# Patient Record
Sex: Female | Born: 1935 | Race: White | Hispanic: No | Smoking: Never smoker
Health system: Southern US, Community
[De-identification: ages and names within clinical notes are randomized; demographics above are authoritative.]

## PROBLEM LIST (undated history)

## (undated) DIAGNOSIS — E785 Hyperlipidemia, unspecified: Secondary | ICD-10-CM

## (undated) DIAGNOSIS — I10 Essential (primary) hypertension: Secondary | ICD-10-CM

## (undated) DIAGNOSIS — G459 Transient cerebral ischemic attack, unspecified: Secondary | ICD-10-CM

## (undated) DIAGNOSIS — G43909 Migraine, unspecified, not intractable, without status migrainosus: Secondary | ICD-10-CM

## (undated) DIAGNOSIS — E039 Hypothyroidism, unspecified: Secondary | ICD-10-CM

## (undated) DIAGNOSIS — G43119 Migraine with aura, intractable, without status migrainosus: Principal | ICD-10-CM

## (undated) HISTORY — PX: ROTATOR CUFF REPAIR: SHX139

## (undated) HISTORY — DX: Hypothyroidism, unspecified: E03.9

## (undated) HISTORY — DX: Hyperlipidemia, unspecified: E78.5

## (undated) HISTORY — DX: Migraine with aura, intractable, without status migrainosus: G43.119

## (undated) HISTORY — DX: Essential (primary) hypertension: I10

## (undated) HISTORY — PX: ABDOMINAL HYSTERECTOMY: SHX81

## (undated) HISTORY — PX: APPENDECTOMY: SHX54

## (undated) HISTORY — PX: SHOULDER SURGERY: SHX246

---

## 2000-06-03 ENCOUNTER — Other Ambulatory Visit: Admission: RE | Admit: 2000-06-03 | Discharge: 2000-06-03 | Payer: Self-pay | Admitting: Family Medicine

## 2005-01-08 ENCOUNTER — Encounter: Admission: RE | Admit: 2005-01-08 | Discharge: 2005-01-08 | Payer: Self-pay | Admitting: Orthopedic Surgery

## 2005-02-27 ENCOUNTER — Encounter: Admission: RE | Admit: 2005-02-27 | Discharge: 2005-04-17 | Payer: Self-pay | Admitting: Orthopedic Surgery

## 2008-08-03 ENCOUNTER — Emergency Department (HOSPITAL_COMMUNITY): Admission: EM | Admit: 2008-08-03 | Discharge: 2008-08-04 | Payer: Self-pay | Admitting: Emergency Medicine

## 2008-08-13 ENCOUNTER — Ambulatory Visit (HOSPITAL_COMMUNITY): Admission: EM | Admit: 2008-08-13 | Discharge: 2008-08-13 | Payer: Self-pay | Admitting: Emergency Medicine

## 2008-10-16 ENCOUNTER — Encounter: Admission: RE | Admit: 2008-10-16 | Discharge: 2009-01-14 | Payer: Self-pay | Admitting: Orthopedic Surgery

## 2008-11-15 ENCOUNTER — Emergency Department (HOSPITAL_COMMUNITY): Admission: EM | Admit: 2008-11-15 | Discharge: 2008-11-15 | Payer: Self-pay | Admitting: Emergency Medicine

## 2009-05-30 ENCOUNTER — Ambulatory Visit: Payer: Self-pay | Admitting: Cardiology

## 2009-05-30 DIAGNOSIS — R0602 Shortness of breath: Secondary | ICD-10-CM

## 2009-05-30 DIAGNOSIS — I10 Essential (primary) hypertension: Secondary | ICD-10-CM

## 2009-06-06 ENCOUNTER — Telehealth (INDEPENDENT_AMBULATORY_CARE_PROVIDER_SITE_OTHER): Payer: Self-pay | Admitting: *Deleted

## 2009-06-07 ENCOUNTER — Encounter (HOSPITAL_COMMUNITY): Admission: RE | Admit: 2009-06-07 | Discharge: 2009-08-16 | Payer: Self-pay | Admitting: Cardiology

## 2009-06-07 ENCOUNTER — Ambulatory Visit: Payer: Self-pay | Admitting: Cardiology

## 2009-06-07 ENCOUNTER — Ambulatory Visit: Payer: Self-pay

## 2010-09-23 ENCOUNTER — Ambulatory Visit (HOSPITAL_COMMUNITY)
Admission: RE | Admit: 2010-09-23 | Discharge: 2010-09-23 | Disposition: A | Discharge status: Home / Self Care | Payer: MEDICARE | Source: Ambulatory Visit | Attending: Family Medicine | Admitting: Family Medicine

## 2010-09-23 DIAGNOSIS — M6281 Muscle weakness (generalized): Secondary | ICD-10-CM | POA: Insufficient documentation

## 2010-09-23 DIAGNOSIS — IMO0001 Reserved for inherently not codable concepts without codable children: Secondary | ICD-10-CM | POA: Insufficient documentation

## 2010-09-23 DIAGNOSIS — M545 Low back pain, unspecified: Secondary | ICD-10-CM | POA: Insufficient documentation

## 2010-09-23 DIAGNOSIS — M1990 Unspecified osteoarthritis, unspecified site: Secondary | ICD-10-CM | POA: Insufficient documentation

## 2010-09-23 DIAGNOSIS — R262 Difficulty in walking, not elsewhere classified: Secondary | ICD-10-CM | POA: Insufficient documentation

## 2010-11-28 LAB — URINALYSIS, ROUTINE W REFLEX MICROSCOPIC
Hgb urine dipstick: NEGATIVE
Ketones, ur: 15 mg/dL — AB
Nitrite: NEGATIVE
Protein, ur: NEGATIVE mg/dL
pH: 5.5 (ref 5.0–8.0)

## 2010-11-28 LAB — BASIC METABOLIC PANEL
CO2: 25 mEq/L (ref 19–32)
Chloride: 105 mEq/L (ref 96–112)
Creatinine, Ser: 0.94 mg/dL (ref 0.4–1.2)
GFR calc non Af Amer: 59 mL/min — ABNORMAL LOW (ref >60)

## 2010-11-28 LAB — DIFFERENTIAL
Lymphocytes Relative: 26 % (ref 12–46)
Monocytes Absolute: 0.8 10*3/uL (ref 0.1–1.0)
Monocytes Relative: 9 % (ref 3–12)
Neutro Abs: 5.5 10*3/uL (ref 1.7–7.7)

## 2010-11-28 LAB — CBC
Hemoglobin: 13.2 g/dL (ref 12.0–15.0)
MCHC: 33.6 g/dL (ref 30.0–36.0)
MCV: 82.4 fL (ref 78.0–100.0)
Platelets: 240 10*3/uL (ref 150–400)

## 2010-12-31 NOTE — Op Note (Signed)
Cindy Duran, Cindy Duran                  ACCOUNT NO.:  [id number]   MEDICAL RECORD NO.:  [id number]          PATIENT TYPE:  INP   LOCATION:  2550                         FACILITY:  [facility]   PHYSICIAN:  Vania Rea. Supple, M.D.  DATE OF BIRTH:  1936-07-16   DATE OF PROCEDURE:  08/13/2008  DATE OF DISCHARGE:  08/13/2008                               OPERATIVE REPORT   PREOPERATIVE DIAGNOSIS:  Displaced right two-part proximal humerus  fracture.   POSTOPERATIVE DIAGNOSIS:  Displaced right two-part proximal humerus  fracture.   PROCEDURE:  Open reduction and internal fixation of right two-part  proximal humerus fracture utilizing the hand innovations shoulder nail  plate.   SURGEON:  Vania Rea. Supple, MD   ASSISTANT:  Lucita Lora. Shuford, PA-C   ANESTHESIA:  General endotracheal as well as local.   ESTIMATED BLOOD LOSS:  200 mL.   DRAINS:  None.   HISTORY:  Ms. Printy is a 75 year old female who fell last week,  sustaining a displaced right two-part proximal humerus fracture.  She  presented to our office last week with persistent complaints of right  shoulder pain with radiographs showing persistent significant  displacement of the proximal humeral fracture.  Due to her ongoing pain,  functional limitations, and the degree of displacement, she is counseled  on treatment options as well as risks versus benefits thereof regarding  open reduction and internal fixation.  Possible complication with  infection, neurovascular injury, malunion, nonunion, loss of fixation,  anesthetic complication, and possible need for additional surgery are  reviewed.  She understands, accepts, and agrees with our planned  procedure.   PROCEDURE IN DETAIL:  After undergoing routine preop evaluation, the  patient received prophylactic antibiotics.  Brought to the operating  room, placed supine on the operating table and underwent smooth  induction of general endotracheal anesthesia.  Placed into a beach-chair  position and appropriately padded and protected.  Fluoroscopic images  were then brought in and confirmed that we had to properly visualize the  proximal humerus.  The right shoulder girdle region was then sterilely  prepped and draped in standard fashion.  The anterolateral deltoid  splitting incision was then made beginning across the acromion and  stenting laterally and distally for approximately 6 cm.  Skin flaps were  elevated and dissection was carried deeply and the deltoid was then  split along the line of its fibers between the anterior mid-third  junction of the deltoid.  Dissection carried into the  subacromial/subdeltoid bursa where multiple adhesions were divided.  The  fracture site was then readily identified and then large hemarthrosis  and hematoma were removed.  We then visualized the distal margin of the  fracture site and used a rongeur to create a trough for insertion of the  nail plate to the proper level.  We then used the standard [employer]  SNP/hand innovations nail plate introduced this into the humeral canal  and seated it to the appropriate depth.  Fluoroscopic images were then  used to confirm proper positioning of the implant and reduction was then  confirmed.  A  single central guide pin was placed into the humeral head  and proper position was then confirmed.  We then placed the peripheral  stabilizing pegs and then the central peg was placed also to appropriate  depth.  We then confirmed proper positioning of the pegs within the  humeral head with Fluoroscopic imaging.  We then used the outrigger  guide to place the 3 unicortical distal locking screws and these all  obtained good bony purchase.  Final fluoroscopic images showed good  positioning of the implant and good alignment of the fracture site.  Outrigger guide was then removed.  The wound was copiously irrigated.  Hemostasis was obtained.  It was closed in layers with a 0 Vicryl for  the deep fascia, 2-0  Vicryl for the subcu, and intracuticular 3-0  Monocryl for the skin followed by Steri-Strips.  A 0.5% Marcaine with  epinephrine instilled into the peri-incisional soft tissues.  Right arm  was placed in the sling.  The patient was then placed supine, awakened,  extubated, and taken to the recovery room in stable condition.      Vania Rea. Supple, M.D.  Electronically Signed     KMS/MEDQ  D:  08/13/2008  T:  08/14/2008  Job:  [id number]

## 2010-12-31 NOTE — Assessment & Plan Note (Signed)
[facility]                            CARDIOLOGY OFFICE NOTE   Cindy Duran, Cindy Duran                         MRN:          [id number]  DATE:05/30/2009                            DOB:          08-08-1936    PRIMARY CARE PHYSICIAN:  Ernestina Penna, MD   REASON FOR PRESENTATION:  Evaluate the patient with progressive dyspnea  and cardiovascular risk factors.   HISTORY OF PRESENT ILLNESS:  The patient is 75 years old.  She has had a  cardiac workup to include exercise treadmill test and an event monitor  in 2003.  She was not clear of the symptoms at that time.  She has never  been told that she had any cardiovascular disease.  However, she has had  progressive dyspnea.  She has been very limited because of back pain.  Over the past year, she has noted dyspnea with any, however minimal,  activity.  She says just walking across her house can get her dyspneic.  She does not describe PND.  She has chronically slept on 2 pillows  because it is more comfortable for her breathing.  She is not describing  any swelling.  She states her weights have been stable.  She does not  describe chest pressure, neck or arm discomfort.  She does not have any  palpitations, presyncope or syncope.  She is not sure whether this is  getting worse or not.  She has had no recent cardiovascular workup.   PAST MEDICAL HISTORY:  Hypertension x40 years, chronic back pain.   PAST SURGICAL HISTORY:  Shoulder surgery, hysterectomy, appendectomy.   ALLERGIES:  SULFA, PENICILLIN, DEMEROL, MORPHINE, IODINE, CRESTOR,  MOBIC, ZOCOR, LATEX.   MEDICATIONS:  1. Synthroid 175 mcg daily.  2. Vitamin D3.  3. Amlodipine 5 mg daily.  4. Diovan HCT 320/25 daily.   SOCIAL HISTORY:  The patient is retired.  She is a widow.  She has 3  children.  She never smoked cigarettes.  She does not drink alcohol.   FAMILY HISTORY:  Remarkable for her father having myocardial infarction  in his 65s.  She  had a brother who died suddenly at age 67 of a  myocardial infarction.  She had a sister died at age 109 of congestive  heart failure.  She has had siblings with later onset heart disease as  well.   REVIEW OF SYSTEMS:  As stated in the HPI and positive for mild  orthostatic symptoms, occasional headaches, mild difficulty swallowing,  joint pains.  Negative for all other systems.   PHYSICAL EXAMINATION:  GENERAL:  The patient is in no distress.  She is  pleasant.  VITAL SIGNS:  Blood pressure 130/70, heart rate 90 and regular, weight  215 pounds, body mass index 36.  HEENT:  Eyelids are unremarkable, pupils are equal, round and reactive  to light, fundi not visualized, oral mucosa unremarkable.  NECK:  No jugular venous distention at 45 degrees, carotid upstroke  brisk and symmetric, no bruits, no thyromegaly.  LYMPHATICS:  No cervical, axillary, or inguinal adenopathy.  LUNGS:  Clear to auscultation bilaterally.  BACK:  No costovertebral angle tenderness.  CHEST:  Unremarkable.  HEART:  PMI not displaced or sustained, S1 and S2 within normal limits,  no S3, no S4, no clicks, no rubs, no murmurs.  ABDOMEN:  Obese, positive bowel sounds.  Normal in frequency and pitch,  no bruits, no rebound, no guarding, no midline pulsatile mass, no  hepatomegaly, no splenomegaly.  SKIN:  No rashes, no nodules.  EXTREMITIES:  2+ pulses throughout, no edema, no cyanosis, no clubbing.  NEURO:  Oriented to person, place and time, cranial nerves II-XII are  grossly intact, motor grossly intact.   EKG; sinus rhythm, rate 98, rightward axis, intervals within normal  limits, no acute ST-T wave changes.   ASSESSMENT AND PLAN:  1. Dyspnea.  The patient's dyspnea is significant.  She does have      cardiovascular risk factors including a very strong family history.      This could be an anginal equivalent.  I would like to walk her on      the treadmill, but she could not do this.  Therefore, she needs  a      stress test with pharmacologic stress and perfusion imaging.  I      will arrange this.  I will also check a BNP level.  2. Obesity.  She understands the need to lose weight with diet and      exercise.  3. Hypertension.  Her blood pressure is controlled and she will      continue the meds as listed.  4. Peripheral vascular disease.  There is a screening study that was      done at the [facility].  It suggests reduced ABIs of 0.86 on      the left and 0.78 on the right.  However, her pulses are reasonable      and she is not describing claudication.  This can be managed with      risk reduction.  5. Hypothyroidism.  This is followed by Dr. Christell Constant.  The last TSH was      5.84 in June 2010.  6. Followup will be as needed based on the results or future symptoms.     Rollene Rotunda, MD, [facility]  Electronically Signed    JH/MedQ  DD: 05/30/2009  DT: 05/31/2009  Job #: [id number]   cc:   Ernestina Penna, M.D.

## 2011-05-23 LAB — DIFFERENTIAL
Basophils Absolute: 0 10*3/uL (ref 0.0–0.1)
Eosinophils Absolute: 0.3 10*3/uL (ref 0.0–0.7)
Lymphocytes Relative: 22 % (ref 12–46)
Lymphs Abs: 2.7 10*3/uL (ref 0.7–4.0)
Monocytes Absolute: 0.9 10*3/uL (ref 0.1–1.0)
Monocytes Relative: 7 % (ref 3–12)

## 2011-05-23 LAB — BASIC METABOLIC PANEL
Calcium: 9.1 mg/dL (ref 8.4–10.5)
Chloride: 104 mEq/L (ref 96–112)
Glucose, Bld: 103 mg/dL — ABNORMAL HIGH (ref 70–99)
Potassium: 4.1 mEq/L (ref 3.5–5.1)

## 2011-05-23 LAB — CBC: RBC: 4.18 MIL/uL (ref 3.87–5.11)

## 2011-05-23 LAB — PROTIME-INR: Prothrombin Time: 13.1 seconds (ref 11.6–15.2)

## 2012-11-19 ENCOUNTER — Other Ambulatory Visit: Payer: Self-pay | Admitting: Family Medicine

## 2012-11-22 NOTE — Telephone Encounter (Signed)
lasst seen 09/14/12, last filled 08/13

## 2012-11-25 ENCOUNTER — Other Ambulatory Visit: Payer: Self-pay | Admitting: *Deleted

## 2012-11-25 MED ORDER — LEVOTHYROXINE SODIUM 175 MCG PO TABS: 175.0000 ug | ORAL_TABLET | Freq: Every day | ORAL | Status: DC

## 2012-11-25 MED ORDER — DIAZEPAM 5 MG PO TABS: ORAL_TABLET | ORAL | Status: DC

## 2012-11-25 NOTE — Telephone Encounter (Signed)
LAST OFFICE VISIT 09/14/12. LET PT KNOW WHEN CALLED IN. [phone].

## 2012-11-29 ENCOUNTER — Other Ambulatory Visit: Payer: Self-pay | Admitting: *Deleted

## 2012-11-29 MED ORDER — PROMETHAZINE HCL 25 MG PO TABS: ORAL_TABLET | ORAL | Status: DC

## 2012-11-29 NOTE — Telephone Encounter (Signed)
LAST OV 1/14 

## 2012-12-02 ENCOUNTER — Telehealth: Payer: Self-pay

## 2012-12-02 MED ORDER — DIAZEPAM 5 MG PO TABS: ORAL_TABLET | ORAL | Status: DC

## 2012-12-02 MED ORDER — LEVOTHYROXINE SODIUM 175 MCG PO TABS: 175.0000 ug | ORAL_TABLET | Freq: Every day | ORAL | Status: DC

## 2012-12-02 NOTE — Telephone Encounter (Signed)
Patient last seen 09/14/12   Thyroid labs 9/13  Call in rx and have nurse call patient to inform

## 2012-12-10 ENCOUNTER — Other Ambulatory Visit: Payer: Self-pay | Admitting: Family Medicine

## 2012-12-13 NOTE — Telephone Encounter (Signed)
DIAZEPAM STILL NEEDS TO BE CALLED IN.

## 2012-12-13 NOTE — Telephone Encounter (Signed)
LAST SEEN 09/14/12. LAST REFILL 12/12

## 2012-12-13 NOTE — Telephone Encounter (Signed)
Phoned into [facility] and left authorization on their voicemail.

## 2013-01-17 ENCOUNTER — Ambulatory Visit: Payer: Self-pay | Admitting: Family Medicine

## 2013-04-14 ENCOUNTER — Emergency Department (HOSPITAL_COMMUNITY): Payer: Medicare Other

## 2013-04-14 ENCOUNTER — Observation Stay (HOSPITAL_COMMUNITY)
Admission: EM | Admit: 2013-04-14 | Discharge: 2013-04-16 | Disposition: A | Discharge status: Home / Self Care | Payer: Medicare Other | Attending: Internal Medicine | Admitting: Internal Medicine

## 2013-04-14 ENCOUNTER — Encounter (HOSPITAL_COMMUNITY): Payer: Self-pay | Admitting: Nurse Practitioner

## 2013-04-14 DIAGNOSIS — Z66 Do not resuscitate: Secondary | ICD-10-CM | POA: Insufficient documentation

## 2013-04-14 DIAGNOSIS — E079 Disorder of thyroid, unspecified: Secondary | ICD-10-CM | POA: Insufficient documentation

## 2013-04-14 DIAGNOSIS — R471 Dysarthria and anarthria: Secondary | ICD-10-CM | POA: Insufficient documentation

## 2013-04-14 DIAGNOSIS — G43909 Migraine, unspecified, not intractable, without status migrainosus: Secondary | ICD-10-CM | POA: Insufficient documentation

## 2013-04-14 DIAGNOSIS — R0602 Shortness of breath: Secondary | ICD-10-CM

## 2013-04-14 DIAGNOSIS — E039 Hypothyroidism, unspecified: Secondary | ICD-10-CM | POA: Diagnosis not present

## 2013-04-14 DIAGNOSIS — I10 Essential (primary) hypertension: Secondary | ICD-10-CM | POA: Diagnosis not present

## 2013-04-14 DIAGNOSIS — E785 Hyperlipidemia, unspecified: Secondary | ICD-10-CM | POA: Diagnosis not present

## 2013-04-14 DIAGNOSIS — G459 Transient cerebral ischemic attack, unspecified: Principal | ICD-10-CM | POA: Insufficient documentation

## 2013-04-14 DIAGNOSIS — R209 Unspecified disturbances of skin sensation: Secondary | ICD-10-CM | POA: Insufficient documentation

## 2013-04-14 DIAGNOSIS — R29810 Facial weakness: Secondary | ICD-10-CM | POA: Insufficient documentation

## 2013-04-14 HISTORY — DX: Migraine, unspecified, not intractable, without status migrainosus: G43.909

## 2013-04-14 LAB — BASIC METABOLIC PANEL
BUN: 14 mg/dL (ref 6–23)
Creatinine, Ser: 0.84 mg/dL (ref 0.50–1.10)
GFR calc Af Amer: 77 mL/min — ABNORMAL LOW (ref >90)
GFR calc non Af Amer: 67 mL/min — ABNORMAL LOW (ref >90)

## 2013-04-14 LAB — POCT I-STAT, CHEM 8
Chloride: 107 mEq/L (ref 96–112)
HCT: 41 % (ref 36.0–46.0)
Potassium: 4.2 mEq/L (ref 3.5–5.1)
Sodium: 141 mEq/L (ref 135–145)

## 2013-04-14 LAB — CBC WITH DIFFERENTIAL/PLATELET
Basophils Relative: 0 % (ref 0–1)
Eosinophils Absolute: 0.3 10*3/uL (ref 0.0–0.7)
HCT: 40 % (ref 36.0–46.0)
Hemoglobin: 13.7 g/dL (ref 12.0–15.0)
MCH: 29.5 pg (ref 26.0–34.0)
MCHC: 34.3 g/dL (ref 30.0–36.0)
Monocytes Absolute: 0.8 10*3/uL (ref 0.1–1.0)
Monocytes Relative: 9 % (ref 3–12)
Neutrophils Relative %: 57 % (ref 43–77)
RDW: 14 % (ref 11.5–15.5)

## 2013-04-14 MED ORDER — DIPHENHYDRAMINE HCL 50 MG/ML IJ SOLN
25.0000 mg | Freq: Once | INTRAMUSCULAR | Status: AC
  Administered 2013-04-14: 25 mg via INTRAVENOUS
  Filled 2013-04-14: qty 1

## 2013-04-14 MED ORDER — ASPIRIN EC 81 MG PO TBEC
81.0000 mg | DELAYED_RELEASE_TABLET | Freq: Every day | ORAL | Status: DC
  Administered 2013-04-14 – 2013-04-15 (×2): 81 mg via ORAL
  Filled 2013-04-14 (×3): qty 1

## 2013-04-14 MED ORDER — DIAZEPAM 5 MG PO TABS
5.0000 mg | ORAL_TABLET | Freq: Once | ORAL | Status: AC
  Administered 2013-04-14: 5 mg via ORAL
  Filled 2013-04-14: qty 1

## 2013-04-14 MED ORDER — METOCLOPRAMIDE HCL 5 MG/ML IJ SOLN
10.0000 mg | Freq: Once | INTRAMUSCULAR | Status: AC
  Administered 2013-04-14: 10 mg via INTRAVENOUS
  Filled 2013-04-14: qty 2

## 2013-04-14 NOTE — ED Notes (Addendum)
Pt reports today around 1730 she felt tears pouring from her R eye and 'it felt droopy" and she was unable to pick up objects with L hand. At the time she called daughter and was "unable to get her words out." symptoms are resolving. A&Ox4 now, speech is clear, grips are =

## 2013-04-14 NOTE — ED Provider Notes (Signed)
CSN: [id number]     Arrival date & time 04/14/13  4098 History   First MD Initiated Contact with Patient 04/14/13 1951     Chief Complaint  Patient presents with  . Stroke Symptoms   (Consider location/radiation/quality/duration/timing/severity/associated sxs/prior Treatment) The history is provided by the patient.  Cindy Duran is a 77 y.o. female history of hypertension, migraine presenting left hand numbness and right facial droop and slurred speech. And numbness and weakness started about 5:30 PM today. Subsequently she noticed that she was drooling on the right side and had slurred speech for about 10 minutes. Symptoms resolved on arrival. Also has intermittent headaches that is getting worse but she said that similar to her previous migraines. She said headache is not very bad currently and it is not sudden onset or worse headache of her life.   Past Medical History  Diagnosis Date  . Hypertension   . Migraine   . Thyroid disease    Past Surgical History  Procedure Laterality Date  . Abdominal hysterectomy     History reviewed. No pertinent family history. History  Substance Use Topics  . Smoking status: Never Smoker   . Smokeless tobacco: Not on file  . Alcohol Use: No   OB History   Grav Para Term Preterm Abortions TAB SAB Ect Mult Living                 Review of Systems  Neurological: Positive for speech difficulty, weakness and headaches.  All other systems reviewed and are negative.    Allergies  Iodine; Latex; Meloxicam; Meperidine hcl; Morphine; Penicillins; Rosuvastatin; Simvastatin; and Sulfonamide derivatives  Home Medications   Current Outpatient Rx  Name  Route  Sig  Dispense  Refill  . diazepam (VALIUM) 5 MG tablet   Oral   Take 5 mg by mouth daily as needed for anxiety.         Marland Kitchen levothyroxine (SYNTHROID) 175 MCG tablet   Oral   Take 1 tablet (175 mcg total) by mouth daily before breakfast.   30 tablet   4   . traMADol (ULTRAM) 50 MG  tablet   Oral   Take 50 mg by mouth daily as needed for pain.          BP 153/62  Pulse 86  Temp(Src) 97.8 F (36.6 C) (Oral)  Resp 21  Ht 5\' 6"  (1.676 m)  Wt 195 lb (88.451 kg)  BMI 31.49 kg/m2  SpO2 96% Physical Exam  Nursing note and vitals reviewed. Constitutional: She is oriented to person, place, and time. She appears well-developed and well-nourished.  Comfortable, NAD   HENT:  Head: Normocephalic.  Mouth/Throat: Oropharynx is clear and moist.  Eyes: Conjunctivae are normal. Pupils are equal, round, and reactive to light.  Neck: Normal range of motion. Neck supple.  Cardiovascular: Normal rate, regular rhythm and normal heart sounds.   Pulmonary/Chest: Effort normal and breath sounds normal. No respiratory distress. She has no wheezes. She has no rales.  Abdominal: Soft. Bowel sounds are normal. She exhibits no distension. There is no tenderness. There is no rebound and no guarding.  Musculoskeletal: Normal range of motion. She exhibits no edema and no tenderness.  Neurological: She is alert and oriented to person, place, and time.  Face symmetric, no droop. NL strength and sensation throughout.   Skin: Skin is warm and dry.  Psychiatric: She has a normal mood and affect. Her behavior is normal. Judgment and thought content normal.  ED Course  Procedures (including critical care time) Labs Review Labs Reviewed  BASIC METABOLIC PANEL - Abnormal; Notable for the following:    GFR calc non Af Amer 67 (*)    GFR calc Af Amer 77 (*)    All other components within normal limits  POCT I-STAT, CHEM 8 - Abnormal; Notable for the following:    Calcium, Ion 1.12 (*)    All other components within normal limits  GLUCOSE, CAPILLARY  CBC WITH DIFFERENTIAL  LIPID PANEL  HEMOGLOBIN A1C   Imaging Review Ct Head (brain) Wo Contrast  04/14/2013   *RADIOLOGY REPORT*  Clinical Data: Blurred speech, numbness left hand, right side headache, mild chronic right  CT HEAD WITHOUT  CONTRAST  Technique:  Contiguous axial images were obtained from the base of the skull through the vertex without contrast.  Comparison: 11/15/2000  Findings: Generalized atrophy. Normal ventricular morphology. No midline shift or mass effect. Small vessel chronic ischemic changes of deep cerebral white matter. 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 No intracranial hemorrhage, mass lesion or evidence of acute infarction. No extra-axial fluid collection. Small mucosal retention cyst right maxillary sinus. Osseous structures unremarkable.  IMPRESSION: Mild small vessel chronic ischemic changes of deep cerebral white matter. 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 No acute intracranial abnormalities.   Original Report Authenticated By: Ulyses Southward, M.D.      MDM  No diagnosis found. Cindy Duran is a 77 y.o. female here with symptoms of TIA. No residual neuro deficit so code stroke not called (even though she is in the window). Will do CT head and call neuro. Will likely need inpatient stroke workup.   11:17 PM Neuro saw patient. Recommend admission for TIA workup. Will admit under Dr. Cena Benton.    Richardean Canal, MD 04/14/13 [phone]

## 2013-04-14 NOTE — H&P (Signed)
[facility] History and Physical  Cindy Duran ZOX:[id number] DOB: 1936/02/15 DOA: 04/14/2013  Referring physician: Dr. Silverio Lay PCP: Rudi Heap, MD  Specialists: Neurologist: Dr. Wyatt Portela  Chief Complaint: Numbness at left hand and altered speech  HPI: Cindy Duran is a 77 y.o. female  Who presented with the above complaints starting since 5:30 pm.  Reportedly the symptoms lasted several hours but have resolved since presentation to the ED.  Nothing the patient is aware of made her symptoms better or worse and they just seemed to have resolved without any intervention.  Neurologist was consulted and after evaluation recommended further work up.  The patient denies any headaches prior to event, new fevers, chills, and or photophobia.   We were consulted for admission and evaluation given concerns for TIA.   Review of Systems: 10 point review of systems negative unless otherwise mentioned above  Past Medical History  Diagnosis Date  . Hypertension   . Migraine   . Thyroid disease    Past Surgical History  Procedure Laterality Date  . Abdominal hysterectomy     Social History:  reports that she has never smoked. She does not have any smokeless tobacco history on file. She reports that she does not drink alcohol or use illicit drugs.   Can patient participate in ADLs? yes  Allergies  Allergen Reactions  . Iodine Other (See Comments)    "skin comes off"  . Latex Other (See Comments)    Skin peeling off  . Meloxicam Other (See Comments)    Pt can't remember rxn  . Meperidine Hcl Nausea And Vomiting  . Morphine Nausea And Vomiting  . Other     Clorox Bleach  . Penicillins Hives  . Rosuvastatin Other (See Comments)    Pt can't remember rxn  . Simvastatin Other (See Comments)    Pt can't remember rxn  . Sulfonamide Derivatives Swelling    History reviewed. No pertinent family history. None other reported  Prior to Admission medications   Medication Sig Start  Date End Date Taking? Authorizing Provider  diazepam (VALIUM) 5 MG tablet Take 5 mg by mouth daily as needed for anxiety.   Yes Historical Provider, MD  levothyroxine (SYNTHROID) 175 MCG tablet Take 1 tablet (175 mcg total) by mouth daily before breakfast. 12/02/12  Yes Ernestina Penna, MD  traMADol (ULTRAM) 50 MG tablet Take 50 mg by mouth daily as needed for pain.   Yes Historical Provider, MD   Physical Exam: Filed Vitals:   04/14/13 2300  BP: 153/62  Pulse: 86  Temp:   Resp: 21     General:  Pt in NAD, alert and awake  Eyes: EOMI, non icteric  ENT: normal exterior appearance, MMM  Neck: supple, no goiter  Cardiovascular: RRR, no MRG  Respiratory: CTA BL, no wheezes  Abdomen: soft, NT, ND  Skin: warm and dry  Musculoskeletal: no cyanosis or clubbing  Psychiatric: mood and affect appropriate  Neurologic: answers questions appropriately, no pronator drift, no facial asymmetry. Moves all extremities equally.  Labs on Admission:  Basic Metabolic Panel:  Recent Labs Lab 04/14/13 2034 04/14/13 2246  NA 140 141  K 4.2 4.2  CL 106 107  CO2 25  --   GLUCOSE 92 92  BUN 14 14  CREATININE 0.84 0.90  CALCIUM 9.4  --    Liver Function Tests: No results found for this basename: AST, ALT, ALKPHOS, BILITOT, PROT, ALBUMIN,  in the last 168 hours No results found  for this basename: LIPASE, AMYLASE,  in the last 168 hours No results found for this basename: AMMONIA,  in the last 168 hours CBC:  Recent Labs Lab 04/14/13 2034 04/14/13 2246  WBC 8.4  --   NEUTROABS 4.8  --   HGB 13.7 13.9  HCT 40.0 41.0  MCV 86.2  --   PLT 222  --    Cardiac Enzymes: No results found for this basename: CKTOTAL, CKMB, CKMBINDEX, TROPONINI,  in the last 168 hours  BNP (last 3 results) No results found for this basename: PROBNP,  in the last 8760 hours CBG:  Recent Labs Lab 04/14/13 1909  GLUCAP 93    Radiological Exams on Admission: Ct Head (brain) Wo Contrast  04/14/2013    *RADIOLOGY REPORT*  Clinical Data: Blurred speech, numbness left hand, right side headache, mild chronic right  CT HEAD WITHOUT CONTRAST  Technique:  Contiguous axial images were obtained from the base of the skull through the vertex without contrast.  Comparison: 11/15/2000  Findings: Generalized atrophy. Normal ventricular morphology. No midline shift or mass effect. Small vessel chronic ischemic changes of deep cerebral white matter. 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 No intracranial hemorrhage, mass lesion or evidence of acute infarction. No extra-axial fluid collection. Small mucosal retention cyst right maxillary sinus. Osseous structures unremarkable.  IMPRESSION: Mild small vessel chronic ischemic changes of deep cerebral white matter. 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 No acute intracranial abnormalities.   Original Report Authenticated By: Ulyses Southward, M.D.    EKG: Independently reviewed. Normal sinus rhythm with no ST elevations or depression. Non specific st wave changes  Assessment/Plan Active Problems:  1. TIA - Neurology on board - Routine TIA order set placed - Further imaging studies ordered please refer to neurologist note for details - asa 81 mg recommended by neurologist. - Allow permissive htn at this juncture.  2. Hypothyroidism - stable, continue home regimen.  3. DVT prophylaxis - Heparin   Code Status: DNR Family Communication: discussed with patient and family  Disposition Plan: Pending further evaluation and recommendations from neurologist.   Time spent: > 50 minutes  Penny Pia [facility] Pager [phone]  If 7PM-7AM, please contact night-coverage [email or web address] Password [facility] 04/14/2013, 11:31 PM

## 2013-04-14 NOTE — Consult Note (Signed)
Referring Physician: Dr. Bernette Mayers    Chief Complaint: transient dysarthria, left hand-face numbness, left face droop.  HPI:                                                                                                                                         Cindy Duran is an 77 y.o. female with a past medical history significant for HTN, hyperlipidemia, episodic migraine without aura, thyroid disease, who was in her usual state of health until 5:30 pm today when she developed abrupt onset of left hand and face numbness, left face droopiness,  and slurred speech. Never had similar symptoms before. She recalls picking up her grandson, sitting in a chair, and then experiencing numbness of the left hand and face. She said that she couldn't speak properly and when she talked to her daughter on the phone " she sound it like a drunk person". Her son said that when he arrived home he notice that " her smile was not symmetric". The episode lasted for about 15 or 20 minutes and resolved. Cindy Duran said that she has been having a headache today but never had had a migraine with the above mentioned symptoms.Denies vertigo, double vision, difficulty swallowing, focal weakness, confusion, or visual disturbances. CT brain revealed no acute abnormality.  Date last known well: 04/14/13  Time last known well: 5:30 pm tPA Given: no, symptoms resolved NIHSS: 0 MRS: 0  Past Medical History  Diagnosis Date  . Hypertension   . Migraine   . Thyroid disease     Past Surgical History  Procedure Laterality Date  . Abdominal hysterectomy      History reviewed. No pertinent family history. Social History:  reports that she has never smoked. She does not have any smokeless tobacco history on file. She reports that she does not drink alcohol or use illicit drugs.  Allergies:  Allergies  Allergen Reactions  . Iodine   . Latex   . Meloxicam   . Meperidine Hcl   . Morphine   . Penicillins   . Rosuvastatin   .  Simvastatin   . Sulfonamide Derivatives     Medications:                                                                                                                           I have reviewed the patient's  current medications.  ROS:                                                                                                                                       History obtained from the patient, family, chart review.  General ROS: negative for - chills, fatigue, fever, night sweats, weight gain or weight loss Psychological ROS: negative for - behavioral disorder, hallucinations, memory difficulties, mood swings or suicidal ideation Ophthalmic ROS: negative for - blurry vision, double vision, eye pain or loss of vision ENT ROS: negative for - epistaxis, nasal discharge, oral lesions, sore throat, tinnitus or vertigo Allergy and Immunology ROS: negative for - hives or itchy/watery eyes Hematological and Lymphatic ROS: negative for - bleeding problems, bruising or swollen lymph nodes Endocrine ROS: negative for - galactorrhea, hair pattern changes, polydipsia/polyuria or temperature intolerance Respiratory ROS: negative for - cough, hemoptysis, shortness of breath or wheezing Cardiovascular ROS: negative for - chest pain, dyspnea on exertion, edema or irregular heartbeat Gastrointestinal ROS: negative for - abdominal pain, diarrhea, hematemesis, nausea/vomiting or stool incontinence Genito-Urinary ROS: negative for - dysuria, hematuria, incontinence or urinary frequency/urgency Musculoskeletal ROS: negative for - joint swelling or muscular weakness Neurological ROS: as noted in HPI Dermatological ROS: negative for rash and skin lesion changes    Physical exam: pleasant female in no apparent distress. Blood pressure 160/92, pulse 71, temperature 97.8 F (36.6 C), temperature source Oral, resp. rate 16, height 5\' 6"  (1.676 m), weight 88.451 kg (195 lb), SpO2 97.00%. Head:  normocephalic. Neck: supple, no bruits, no JVD. Cardiac: no murmurs. Lungs: clear. Abdomen: soft, no tender, no mass. Extremities: no edema.    Neurologic Examination:                                                                                                      Mental Status: Alert, oriented, thought content appropriate.  Speech fluent without evidence of aphasia.  Able to follow 3 step commands without difficulty. Cranial Nerves: II: Discs flat bilaterally; Visual fields grossly normal, pupils equal, round, reactive to light and accommodation III,IV, VI: ptosis not present, extra-ocular motions intact bilaterally V,VII: smile symmetric, facial light touch sensation normal bilaterally VIII: hearing normal bilaterally IX,X: gag reflex present XI: bilateral shoulder shrug XII: midline tongue extension Motor: Right : Upper extremity   5/5    Left:     Upper extremity   5/5  Lower extremity   5/5     Lower extremity   5/5 Tone  and bulk:normal tone throughout; no atrophy noted Sensory: Pinprick and light touch intact throughout, bilaterally Deep Tendon Reflexes:  Right: Upper Extremity   Left: Upper extremity   biceps (C-5 to C-6) 2/4   biceps (C-5 to C-6) 2/4 tricep (C7) 2/4    triceps (C7) 2/4 Brachioradialis (C6) 2/4  Brachioradialis (C6) 2/4  Lower Extremity Lower Extremity  quadriceps (L-2 to L-4) 2/4   quadriceps (L-2 to L-4) 2/4 Achilles (S1) 2/4   Achilles (S1) 2/4  Plantars: Right: downgoing   Left: downgoing Cerebellar: normal finger-to-nose,  normal heel-to-shin test Gait:  No ataxia. CV: pulses palpable throughout    Results for orders placed during the hospital encounter of 04/14/13 (from the past 48 hour(s))  GLUCOSE, CAPILLARY     Status: None   Collection Time    04/14/13  7:09 PM      Result Value Range   Glucose-Capillary 93  70 - 99 mg/dL   No results found.   Assessment: 77 y.o. female with possible right brain TIA. She has a history of  episodic migraine and can not entirely exclude the possibility of complicated migraine. Recommend admission to medicine and completion of TIA work up. Aspirin 81 mg daily.    Stroke Risk Factors - age, HTN, hyperlipidemia.  Plan: 1. HgbA1c, fasting lipid panel 2. MRI, MRA  of the brain without contrast 3. Echocardiogram 4. Carotid dopplers 5. Prophylactic therapy-aspirin 81 mg daily 6. Risk factor modification 7. Telemetry monitoring 8. Frequent neuro checks 9. PT/OT SLP (no need at this time)   Wyatt Portela, MD [facility] [phone]  04/14/2013, 9:03 PM

## 2013-04-15 ENCOUNTER — Encounter (HOSPITAL_COMMUNITY): Payer: Self-pay | Admitting: *Deleted

## 2013-04-15 ENCOUNTER — Observation Stay (HOSPITAL_COMMUNITY): Payer: Medicare Other

## 2013-04-15 DIAGNOSIS — I517 Cardiomegaly: Secondary | ICD-10-CM

## 2013-04-15 LAB — TSH: TSH: 5.337 u[IU]/mL — ABNORMAL HIGH (ref 0.350–4.500)

## 2013-04-15 LAB — RAPID URINE DRUG SCREEN, HOSP PERFORMED
Barbiturates: NOT DETECTED
Cocaine: NOT DETECTED
Tetrahydrocannabinol: NOT DETECTED

## 2013-04-15 LAB — HEMOGLOBIN A1C
Hgb A1c MFr Bld: 5.5 % (ref <5.7)
Mean Plasma Glucose: 111 mg/dL (ref <117)

## 2013-04-15 MED ORDER — HEPARIN SODIUM (PORCINE) 5000 UNIT/ML IJ SOLN
5000.0000 [IU] | Freq: Three times a day (TID) | INTRAMUSCULAR | Status: DC
  Administered 2013-04-15 – 2013-04-16 (×4): 5000 [IU] via SUBCUTANEOUS
  Filled 2013-04-15 (×7): qty 1

## 2013-04-15 MED ORDER — ATORVASTATIN CALCIUM 20 MG PO TABS
20.0000 mg | ORAL_TABLET | Freq: Every day | ORAL | Status: DC
  Administered 2013-04-15: 20 mg via ORAL
  Filled 2013-04-15 (×2): qty 1

## 2013-04-15 MED ORDER — TRAMADOL HCL 50 MG PO TABS: 50.0000 mg | ORAL_TABLET | Freq: Every day | ORAL | Status: DC | PRN

## 2013-04-15 MED ORDER — LEVOTHYROXINE SODIUM 175 MCG PO TABS
175.0000 ug | ORAL_TABLET | Freq: Every day | ORAL | Status: DC
  Administered 2013-04-15: 175 ug via ORAL
  Filled 2013-04-15 (×3): qty 1

## 2013-04-15 MED ORDER — DIAZEPAM 5 MG PO TABS: 5.0000 mg | ORAL_TABLET | Freq: Every day | ORAL | Status: DC | PRN

## 2013-04-15 NOTE — Progress Notes (Deleted)
Echo Lab  2D Echocardiogram completed.  Laprecious Austill L Aldrin Engelhard, RDCS 04/15/2013 9:46 AM

## 2013-04-15 NOTE — Progress Notes (Signed)
Echocardiogram 2D Echocardiogram has been performed.  Cindy Duran 04/15/2013, 2:37 PM

## 2013-04-15 NOTE — Progress Notes (Signed)
[facility] PROGRESS NOTE  Cindy Duran VHQ:[id number] DOB: 1935/10/04 DOA: 04/14/2013 PCP: Rudi Heap, MD  Assessment/Plan: TIA - head CT negative. MRI brain, 2D echo and carotid doppler pending - asa 81 mg daily. Appreciate neuro recs. - Allow permissive HTN - LDL of 102, continue lipitor   Hypothyroidism  - check TSH   HTN  allow permissive BP  Hx of migraine  on equate at home prn symptoms   DVT prophylaxis  SQ  Heparin      Code Status: DNR Family Communication: family at bedside Disposition Plan:home once w/up completed   Consultants:  neurology  Procedures:  none  Antibiotics:  none  HPI/Subjective: Patient denies any weakness or symptoms at this time  Objective: Filed Vitals:   04/15/13 1042  BP: 133/72  Pulse: 45  Temp: 98.1 F (36.7 C)  Resp: 20   No intake or output data in the 24 hours ending 04/15/13 1301 Filed Weights   04/14/13 1858  Weight: 88.451 kg (195 lb)    Exam:   General: elderly female in AND   HEENT: no pallor, moist oral mucosa  Chest: clear b/l, no added sounds  CVS: NS1&S2, no murmurs  Abd: soft, NT, ND, BS+  Ext: warm, no edema  CNS: AAOX3, non focal    Data Reviewed: Basic Metabolic Panel:  Recent Labs Lab 04/14/13 2034 04/14/13 2246  NA 140 141  K 4.2 4.2  CL 106 107  CO2 25  --   GLUCOSE 92 92  BUN 14 14  CREATININE 0.84 0.90  CALCIUM 9.4  --    Liver Function Tests: No results found for this basename: AST, ALT, ALKPHOS, BILITOT, PROT, ALBUMIN,  in the last 168 hours No results found for this basename: LIPASE, AMYLASE,  in the last 168 hours No results found for this basename: AMMONIA,  in the last 168 hours CBC:  Recent Labs Lab 04/14/13 2034 04/14/13 2246  WBC 8.4  --   NEUTROABS 4.8  --   HGB 13.7 13.9  HCT 40.0 41.0  MCV 86.2  --   PLT 222  --    Cardiac Enzymes: No results found for this basename: CKTOTAL, CKMB, CKMBINDEX, TROPONINI,  in the last 168  hours BNP (last 3 results) No results found for this basename: PROBNP,  in the last 8760 hours CBG:  Recent Labs Lab 04/14/13 1909  GLUCAP 93    No results found for this or any previous visit (from the past 240 hour(s)).   Studies: Ct Head (brain) Wo Contrast  04/14/2013   *RADIOLOGY REPORT*  Clinical Data: Blurred speech, numbness left hand, right side headache, mild chronic right  CT HEAD WITHOUT CONTRAST  Technique:  Contiguous axial images were obtained from the base of the skull through the vertex without contrast.  Comparison: 11/15/2000  Findings: Generalized atrophy. Normal ventricular morphology. No midline shift or mass effect. Small vessel chronic ischemic changes of deep cerebral white matter. 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 No intracranial hemorrhage, mass lesion or evidence of acute infarction. No extra-axial fluid collection. Small mucosal retention cyst right maxillary sinus. Osseous structures unremarkable.  IMPRESSION: Mild small vessel chronic ischemic changes of deep cerebral white matter. 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 No acute intracranial abnormalities.   Original Report Authenticated By: Ulyses Southward, M.D.    Scheduled Meds: . aspirin EC  81 mg Oral Daily  . atorvastatin  20 mg Oral QHS  . heparin  5,000 Units Subcutaneous Q8H  .  levothyroxine  175 mcg Oral QAC breakfast   Continuous Infusions:      Time spent: 25 minutes    Jadwiga Faidley  [facility] Pager [phone]. If 7PM-7AM, please contact night-coverage at [email or web address], password [facility] 04/15/2013, 1:01 PM  LOS: 1 day

## 2013-04-15 NOTE — Progress Notes (Signed)
Stroke Team Progress Note  HISTORY Cindy Duran is an 77 y.o. female with a past medical history significant for HTN, hyperlipidemia, episodic migraine without aura, thyroid disease, who was in her usual state of health until 5:30 pm today 04/14/2013 when she developed abrupt onset of left hand and face numbness, left face droopiness, and slurred speech. Never had similar symptoms before. She recalls picking up her grandson, sitting in a chair, and then experiencing numbness of the left hand and face. She said that she couldn't speak properly and when she talked to her daughter on the phone " she sound it like a drunk person". Her son said that when he arrived home he notice that " her smile was not symmetric". The episode lasted for about 15 or 20 minutes and resolved. She presented to the ED 04/14/2013 1852.   Mrs. Deutschman said that she has been having a headache but never had had a migraine with the above mentioned symptoms. Denies vertigo, double vision, difficulty swallowing, focal weakness, confusion, or visual disturbances. CT brain revealed no acute abnormality.   Patient was not a TPA candidate secondary to symptoms resolved, NIHSS 0. She was admitted for further evaluation and treatment.  SUBJECTIVE Her daughter and granddaughter are at the bedside.  Overall she feels her condition is gradually improving.   OBJECTIVE Most recent Vital Signs: Filed Vitals:   04/14/13 2300 04/15/13 0051 04/15/13 0132 04/15/13 0539  BP: 153/62 179/87 155/81 159/89  Pulse: 86 83 75 75  Temp:  97.6 F (36.4 C) 97.5 F (36.4 C) 97.7 F (36.5 C)  TempSrc:  Oral Oral Oral  Resp: 21 20 20 20   Height:      Weight:      SpO2: 96% 99% 98% 96%   CBG (last 3)   Recent Labs  04/14/13 1909  GLUCAP 93    IV Fluid Intake:     MEDICATIONS  . aspirin EC  81 mg Oral Daily  . heparin  5,000 Units Subcutaneous Q8H  . levothyroxine  175 mcg Oral QAC breakfast   PRN:  diazepam, traMADol  Diet:  Cardiac thin  liquids Activity:   Bathroom privileges with assistance DVT Prophylaxis:  Heparin 5000 units sq tid   CLINICALLY SIGNIFICANT STUDIES Basic Metabolic Panel:  Recent Labs Lab 04/14/13 2034 04/14/13 2246  NA 140 141  K 4.2 4.2  CL 106 107  CO2 25  --   GLUCOSE 92 92  BUN 14 14  CREATININE 0.84 0.90  CALCIUM 9.4  --    Liver Function Tests: No results found for this basename: AST, ALT, ALKPHOS, BILITOT, PROT, ALBUMIN,  in the last 168 hours CBC:  Recent Labs Lab 04/14/13 2034 04/14/13 2246  WBC 8.4  --   NEUTROABS 4.8  --   HGB 13.7 13.9  HCT 40.0 41.0  MCV 86.2  --   PLT 222  --    Coagulation: No results found for this basename: LABPROT, INR,  in the last 168 hours Cardiac Enzymes: No results found for this basename: CKTOTAL, CKMB, CKMBINDEX, TROPONINI,  in the last 168 hours Urinalysis: No results found for this basename: COLORURINE, APPERANCEUR, LABSPEC, PHURINE, GLUCOSEU, HGBUR, BILIRUBINUR, KETONESUR, PROTEINUR, UROBILINOGEN, NITRITE, LEUKOCYTESUR,  in the last 168 hours Lipid Panel    Component Value Date/Time   CHOL 162 04/15/2013 0650   TRIG 90 04/15/2013 0650   HDL 42 04/15/2013 0650   CHOLHDL 3.9 04/15/2013 0650   VLDL 18 04/15/2013 0650   LDLCALC 102*  04/15/2013 0650   HgbA1C  No results found for this basename: HGBA1C    Urine Drug Screen:   No results found for this basename: labopia, cocainscrnur, labbenz, amphetmu, thcu, labbarb    Alcohol Level: No results found for this basename: ETH,  in the last 168 hours  CT of the brain  04/14/2013    Mild small vessel chronic ischemic changes of deep cerebral white matter. 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 No acute intracranial abnormalities.  MRI of the brain    MRA of the brain    2D Echocardiogram    Carotid Doppler    CXR    EKG  normal sinus rhythm.   Therapy Recommendations   Physical Exam   Pleasant elderly Caucasian lady not in distress.Awake alert. Afebrile. Head is  nontraumatic. Neck is supple without bruit. Hearing is normal. Cardiac exam no murmur or gallop. Lungs are clear to auscultation. Distal pulses are well felt. Neurological Exam :   Awake  Alert oriented x 3. Normal speech and language.eye movements full without nystagmus.fundi were not visualized. Vision acuity and fields appear normal. Hearing is normal. Palatal movements are normal. Face symmetric. Tongue midline. Normal strength, tone, reflexes and coordination. Normal sensation. Gait deferred. ASSESSMENT Cindy Duran is a 77 y.o. female presenting with transient dysarthria, left hand-face numbness, left face droop. CT shows old left external capsule lacunar infarct, MRI imaging pending. Suspect a right brain stroke, right brain TIA or complicated migraine.   On aspirin 81 mg orally every day prior to admission. Now on aspirin 81 mg orally every day for secondary stroke prevention. Patient with no resultant neuro deficits. Work up underway.  Hypertension Hyperlipidemia, LDL 102, on lipitor 20 PTA, now on no statin, goal LDL < 100 Hx Episodic migraine without aura - has had extensive testing in the past. Uses generic Equate Headache Relief, up to 4 tablets a day, each individual table contains 250 mg of aspirin adn 65 mg caffeine Thyroid disease Family history stroke (mother)  Hospital day # 1  TREATMENT/PLAN  Continue aspirin 81 mg orally every day for secondary stroke prevention for now. Consider change to plavix 75 mg daily if MRI positive for stroke (anticoagulation if an embolic source is found) - will needs to incorporate amt of headache medicine she takes a day into decision given large amt of aspirin  Resume home dose lipitor  F/u MRI, MRA, Carotids, 2D  Dr. Pearlean Brownie discussed diagnosis, prognosis,  treatment options and plan of care with patient, daughter and granddaughter.    Annie Main, MSN, RN, ANVP-BC, ANP-BC, [facility] Pager: [phone] 04/15/2013  9:30 AM  I have personally obtained a history, examined the patient, evaluated imaging results, and formulated the assessment and plan of care. I agree with the above. Delia Heady, MD

## 2013-04-16 DIAGNOSIS — E785 Hyperlipidemia, unspecified: Secondary | ICD-10-CM | POA: Diagnosis present

## 2013-04-16 DIAGNOSIS — G459 Transient cerebral ischemic attack, unspecified: Secondary | ICD-10-CM

## 2013-04-16 LAB — GLUCOSE, CAPILLARY: Glucose-Capillary: 88 mg/dL (ref 70–99)

## 2013-04-16 MED ORDER — AMLODIPINE BESYLATE 5 MG PO TABS
5.0000 mg | ORAL_TABLET | Freq: Every day | ORAL | Status: DC
  Filled 2013-04-16: qty 1

## 2013-04-16 MED ORDER — ASPIRIN 81 MG PO TBEC: 81.0000 mg | DELAYED_RELEASE_TABLET | Freq: Every day | ORAL | Status: DC

## 2013-04-16 NOTE — Progress Notes (Signed)
The patient is being discharged today. The patient's nurse is currently in the room going over the discharge instructions with the patient and her daughter. They did not have any questions at this time. The plan is for her to followup with Dr. Pearlean Brownie in 2 months.  Delton See PA-C [facility] Pager [phone] 04/16/2013, 9:32 AM

## 2013-04-16 NOTE — Discharge Summary (Addendum)
Physician Discharge Summary  Cindy Duran ZOX:[id number] DOB: Mar 15, 1936 DOA: 04/14/2013  PCP: Rudi Heap, MD  Admit date: 04/14/2013 Discharge date: 04/16/2013  Time spent: 25 minutes  Recommendations for Outpatient Follow-up:  1. Home with outpt PCP follow up 2. Patient's TSH is high (5.37) and synthroid dose should be adjusted as outpt  Discharge Diagnoses:    TIA (transient ischemic attack)  Active Problems:   HYPERTENSION, BENIGN   Other and unspecified hyperlipidemia   Discharge Condition: fair  Diet recommendation: cardiac  Filed Weights   04/14/13 1858  Weight: 88.451 kg (195 lb)    History of present illness:  Cindy Duran is a 77 y.o. female Who presented with the above complaints starting since 5:30 pm. Reportedly the symptoms lasted several hours but have resolved since presentation to the ED. Nothing the patient is aware of made her symptoms better or worse and they just seemed to have resolved without any intervention. Neurologist was consulted and after evaluation recommended further work up. The patient denies any headaches prior to event, new fevers, chills, and or photophobia.      Hospital Course:  Patient admitted to telemetry under observation for TIA. W/up including head CT, MRI brain and 2D echo were unremarkable. Symptoms completely resolved.  placed on baby aspirin which she will continue . LDL was 102 and she is already on statin. Explained about secondary prevention of stroke, healthy diet and regular exercise. She also informs taking 3-4 tablets of Equate head relief  for her migraine headaches which contains aspirin ( about 250 mg daily). instructed on monitoring for any signs of bleeding while on high dose of aspirin. Patient stable for discharge.   Procedures:  none  Consultations: Neurology  Discharge Exam: Filed Vitals:   04/16/13 0628  BP: 185/94  Pulse: 75  Temp: 97.4 F (36.3 C)  Resp: 18   General: elderly female in  NAD HEENT: no pallor, moist oral mucosa  Chest: clear b/l, no added sounds  CVS: NS1&S2, no murmurs  Abd: soft, NT, ND, BS+  Ext: warm, no edema CNS:  AAOX3, non focal   Discharge Instructions     Medication List         amLODipine 5 MG tablet  Commonly known as:  NORVASC  Take 5 mg by mouth daily.     aspirin 81 MG EC tablet  Take 1 tablet (81 mg total) by mouth daily.     atorvastatin 20 MG tablet  Commonly known as:  LIPITOR  Take 20 mg by mouth at bedtime.     diazepam 5 MG tablet  Commonly known as:  VALIUM  Take 5 mg by mouth daily as needed for anxiety.     levothyroxine 175 MCG tablet  Commonly known as:  SYNTHROID  Take 1 tablet (175 mcg total) by mouth daily before breakfast.     levothyroxine 25 MCG tablet  Commonly known as:  SYNTHROID, LEVOTHROID  Take 25 mcg by mouth daily before breakfast. Monday thru Friday take a half 12.5mg .  On Saturday and Sunday take the whole 25mg . Take along with the 175mg .     promethazine 25 MG tablet  Commonly known as:  PHENERGAN  Take 25 mg by mouth daily as needed for nausea.     traMADol 50 MG tablet  Commonly known as:  ULTRAM  Take 50 mg by mouth daily as needed for pain.     valsartan-hydrochlorothiazide 320-25 MG per tablet  Commonly known as:  DIOVAN-HCT  Take 1 tablet by mouth daily.     Vitamin D-3 5000 UNITS Tabs  Take 2 capsules by mouth daily.       Allergies  Allergen Reactions  . Iodine Other (See Comments)    "skin comes off"  . Latex Other (See Comments)    Skin peeling off  . Meloxicam Other (See Comments)    Pt can't remember rxn  . Meperidine Hcl Nausea And Vomiting  . Morphine Nausea And Vomiting  . Other     Clorox Bleach  . Penicillins Hives  . Rosuvastatin Other (See Comments)    Pt can't remember rxn  . Simvastatin Other (See Comments)    Pt can't remember rxn  . Sulfonamide Derivatives Swelling       Follow-up Information   Follow up with Rudi Heap, MD In 1 week.    Specialty:  Family Medicine   Contact information:   [address] [place] [address] [phone]        The results of significant diagnostics from this hospitalization (including imaging, microbiology, ancillary and laboratory) are listed below for reference.    Significant Diagnostic Studies: Ct Head (brain) Wo Contrast  04/14/2013   *RADIOLOGY REPORT*  Clinical Data: Blurred speech, numbness left hand, right side headache, mild chronic right  CT HEAD WITHOUT CONTRAST  Technique:  Contiguous axial images were obtained from the base of the skull through the vertex without contrast.  Comparison: 11/15/2000  Findings: Generalized atrophy. Normal ventricular morphology. No midline shift or mass effect. Small vessel chronic ischemic changes of deep cerebral white matter. 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 No intracranial hemorrhage, mass lesion or evidence of acute infarction. No extra-axial fluid collection. Small mucosal retention cyst right maxillary sinus. Osseous structures unremarkable.  IMPRESSION: Mild small vessel chronic ischemic changes of deep cerebral white matter. 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 No acute intracranial abnormalities.   Original Report Authenticated By: Ulyses Southward, M.D.   Mr [facility] Wo Contrast  04/15/2013   *RADIOLOGY REPORT*  Clinical Data:  Stroke.  Numbness left hand.  Altered speech.  MRI HEAD WITHOUT CONTRAST MRA HEAD WITHOUT CONTRAST  Technique:  Multiplanar, multiecho pulse sequences of the brain and surrounding structures were obtained without intravenous contrast. Angiographic images of the head were obtained using MRA technique without contrast.  Comparison:  CT 04/14/2013  MRI HEAD  Findings:  Generalized atrophy.  Negative for hydrocephalus. Chronic microvascular ischemic change in the white matter and pons.  Negative for acute infarct.  Negative for hemorrhage or mass lesion.  Mild mucosal edema in the paranasal  sinuses.  IMPRESSION: Atrophy and chronic microvascular ischemia.  No acute abnormality.  MRA HEAD  Findings: Both vertebral arteries contribute to the basilar.  PICA patent bilaterally.  Basilar is widely patent.  Superior cerebellar and posterior cerebral arteries are patent bilaterally.  Internal carotid artery is patent without stenosis bilaterally. Anterior and middle cerebral arteries are widely patent bilaterally.  Negative for aneurysm.  IMPRESSION: Negative   Original Report Authenticated By: Janeece Riggers, M.D.   Mr Brain Wo Contrast  04/15/2013   *RADIOLOGY REPORT*  Clinical Data:  Stroke.  Numbness left hand.  Altered speech.  MRI HEAD WITHOUT CONTRAST MRA HEAD WITHOUT CONTRAST  Technique:  Multiplanar, multiecho pulse sequences of the brain and surrounding structures were obtained without intravenous contrast. Angiographic images of the head were obtained using MRA technique without contrast.  Comparison:  CT 04/14/2013  MRI HEAD  Findings:  Generalized  atrophy.  Negative for hydrocephalus. Chronic microvascular ischemic change in the white matter and pons.  Negative for acute infarct.  Negative for hemorrhage or mass lesion.  Mild mucosal edema in the paranasal sinuses.  IMPRESSION: Atrophy and chronic microvascular ischemia.  No acute abnormality.  MRA HEAD  Findings: Both vertebral arteries contribute to the basilar.  PICA patent bilaterally.  Basilar is widely patent.  Superior cerebellar and posterior cerebral arteries are patent bilaterally.  Internal carotid artery is patent without stenosis bilaterally. Anterior and middle cerebral arteries are widely patent bilaterally.  Negative for aneurysm.  IMPRESSION: Negative   Original Report Authenticated By: Janeece Riggers, M.D.    Microbiology: No results found for this or any previous visit (from the past 240 hour(s)).   Labs: Basic Metabolic Panel:  Recent Labs Lab 04/14/13 2034 04/14/13 2246  NA 140 141  K 4.2 4.2  CL 106 107  CO2 25   --   GLUCOSE 92 92  BUN 14 14  CREATININE 0.84 0.90  CALCIUM 9.4  --    Liver Function Tests: No results found for this basename: AST, ALT, ALKPHOS, BILITOT, PROT, ALBUMIN,  in the last 168 hours No results found for this basename: LIPASE, AMYLASE,  in the last 168 hours No results found for this basename: AMMONIA,  in the last 168 hours CBC:  Recent Labs Lab 04/14/13 2034 04/14/13 2246  WBC 8.4  --   NEUTROABS 4.8  --   HGB 13.7 13.9  HCT 40.0 41.0  MCV 86.2  --   PLT 222  --    Cardiac Enzymes: No results found for this basename: CKTOTAL, CKMB, CKMBINDEX, TROPONINI,  in the last 168 hours BNP: BNP (last 3 results) No results found for this basename: PROBNP,  in the last 8760 hours CBG:  Recent Labs Lab 04/14/13 1909 04/16/13 0756  GLUCAP 93 88       Signed:  Alazia Crocket  [facility] 04/16/2013, 8:39 AM

## 2013-04-16 NOTE — Progress Notes (Signed)
Ready for discharge home with her daughter; refused am medications; states she will take her own; discharge instructions reviewed and given to patient; patient verbalized understanding of her instructions.

## 2013-04-19 ENCOUNTER — Telehealth: Payer: Self-pay | Admitting: Family Medicine

## 2013-04-19 NOTE — Telephone Encounter (Signed)
Appt scheduled. Patient aware. 

## 2013-04-26 ENCOUNTER — Ambulatory Visit (INDEPENDENT_AMBULATORY_CARE_PROVIDER_SITE_OTHER): Payer: Medicare Other | Admitting: Family Medicine

## 2013-04-26 ENCOUNTER — Encounter: Payer: Self-pay | Admitting: Family Medicine

## 2013-04-26 VITALS — BP 138/86 | HR 78 | Temp 98.2°F | Ht 66.0 in | Wt 190.0 lb

## 2013-04-26 DIAGNOSIS — G459 Transient cerebral ischemic attack, unspecified: Secondary | ICD-10-CM

## 2013-04-26 DIAGNOSIS — G43909 Migraine, unspecified, not intractable, without status migrainosus: Secondary | ICD-10-CM

## 2013-04-26 DIAGNOSIS — I10 Essential (primary) hypertension: Secondary | ICD-10-CM

## 2013-04-26 MED ORDER — DIAZEPAM 5 MG PO TABS: 5.0000 mg | ORAL_TABLET | Freq: Every day | ORAL | Status: DC | PRN

## 2013-04-26 MED ORDER — LEVOTHYROXINE SODIUM 175 MCG PO TABS: 175.0000 ug | ORAL_TABLET | Freq: Every day | ORAL | Status: DC

## 2013-04-26 NOTE — Progress Notes (Signed)
  Subjective:    Patient ID: Cindy Duran, female    DOB: 25-Aug-1935, 77 y.o.   MRN: [id number]  HPI Patient comes to clinic today for hospital followup following a TIA. She comes to the practice with her daughter and granddaughter. Patient was alert and talkative. Her hospital notes x-rays were reviewed. After a long discussion with the patient and her daughter and granddaughter it was decided to call and speak with the neurologist who saw her in the hospital. The patient indicated she been taking at least 500 mg of aspirin a day and sometimes more when she had the TIA. After discussion with the neurologist it was decided to discontinue the current aspirin formulation and change her to Plavix 75 mg once a day with no more aspirin. There was immediate concern by the patient that this would no longer control her headaches. She is willing to make this change and try and her daughter will call me in a week to let him    Review of Systems  Constitutional: Negative.  Negative for activity change.  HENT: Negative.   Eyes: Negative.   Respiratory: Negative.  Negative for choking, chest tightness and shortness of breath.   Cardiovascular: Negative.  Negative for leg swelling.  Gastrointestinal: Negative.   Endocrine: Negative.   Genitourinary: Negative.   Musculoskeletal: Positive for myalgias (body sore ), back pain and arthralgias.  Allergic/Immunologic: Negative.   Neurological: Positive for dizziness (with standing) and light-headedness. Negative for speech difficulty.  Hematological: Bruises/bleeds easily (heparin inj in hosp causing incresed bruising).  Psychiatric/Behavioral: Negative.        Objective:   Physical Exam  Vitals reviewed. Constitutional: She is oriented to person, place, and time. She appears well-developed and well-nourished. No distress.  HENT:  Head: Normocephalic and atraumatic.  Eyes: Conjunctivae are normal. Right eye exhibits no discharge. Left eye exhibits no  discharge. No scleral icterus.  Neck: Normal range of motion. Neck supple. No thyromegaly present.  No bruits were noted in the neck  Cardiovascular: Normal rate, regular rhythm and normal heart sounds.  Exam reveals no gallop and no friction rub.   No murmur heard. Pulmonary/Chest: Effort normal and breath sounds normal. No respiratory distress. She has no wheezes. She has no rales.  Lymphadenopathy:    She has no cervical adenopathy.  Neurological: She is alert and oriented to person, place, and time.  Skin: Skin is warm and dry. No rash noted. No erythema.  Psychiatric: She has a normal mood and affect. Her behavior is normal. Judgment and thought content normal.          Assessment & Plan:  1.  History of TIA  2. Migraine headache  3. Hypertension  Patient Instructions  Discontinue equate Do not take any other form of aspirin May take extra strength Tylenol with Benadryl 4 times daily if needed for headache May still take an Ultram if needed twice day  Take Plavix as directed one daily Monitor blood pressures closely at home and bring back readings for review in 7-10 day   Nyra Capes MD

## 2013-04-26 NOTE — Patient Instructions (Addendum)
Discontinue equate Do not take any other form of aspirin May take extra strength Tylenol with Benadryl 4 times daily if needed for headache May still take an Ultram if needed twice day  Take Plavix as directed one daily Monitor blood pressures closely at home and bring back readings for review in 7-10 day

## 2013-05-04 ENCOUNTER — Telehealth: Payer: Self-pay | Admitting: Family Medicine

## 2013-05-04 NOTE — Telephone Encounter (Signed)
She is doing good has not had any more episodes. she has stopped to head ache med and takes tylenol when needed and benadryl. She is sleeping a lot more now. Her bp is been good been averaging 122/75. They just wanted to let you know she seems to be doing good

## 2013-05-10 ENCOUNTER — Other Ambulatory Visit: Payer: Self-pay | Admitting: Family Medicine

## 2013-05-12 ENCOUNTER — Other Ambulatory Visit: Payer: Self-pay | Admitting: Family Medicine

## 2013-05-12 MED ORDER — AMLODIPINE BESYLATE 5 MG PO TABS: 5.0000 mg | ORAL_TABLET | Freq: Every day | ORAL | Status: DC

## 2013-05-12 MED ORDER — ATORVASTATIN CALCIUM 20 MG PO TABS: 20.0000 mg | ORAL_TABLET | Freq: Every day | ORAL | Status: DC

## 2013-05-12 NOTE — Telephone Encounter (Signed)
What am I approving?

## 2013-05-12 NOTE — Telephone Encounter (Signed)
Last seen 04/26/13  DWM   If approved route to nurse and print

## 2013-05-13 ENCOUNTER — Other Ambulatory Visit: Payer: Self-pay

## 2013-05-13 MED ORDER — TRAMADOL HCL 50 MG PO TABS: 50.0000 mg | ORAL_TABLET | Freq: Every day | ORAL | Status: DC | PRN

## 2013-05-13 NOTE — Telephone Encounter (Signed)
Patient said she was told yesterday that this would be called in by Dr. Christell Constant  [place]eds a 90 day supply [place]   If approved print and route to nurse  Leaving today

## 2013-05-13 NOTE — Telephone Encounter (Signed)
rx ready for pickup 

## 2013-05-16 NOTE — Telephone Encounter (Signed)
Patient aware.

## 2013-05-18 NOTE — Telephone Encounter (Signed)
Script for tramadol was picked up by daughter.

## 2013-06-21 ENCOUNTER — Encounter: Payer: Self-pay | Admitting: Nurse Practitioner

## 2013-06-21 ENCOUNTER — Ambulatory Visit (INDEPENDENT_AMBULATORY_CARE_PROVIDER_SITE_OTHER): Payer: Medicare Other | Admitting: Nurse Practitioner

## 2013-06-21 VITALS — BP 108/69 | HR 82 | Ht 66.0 in | Wt 180.0 lb

## 2013-06-21 DIAGNOSIS — G459 Transient cerebral ischemic attack, unspecified: Secondary | ICD-10-CM

## 2013-06-21 DIAGNOSIS — R55 Syncope and collapse: Secondary | ICD-10-CM

## 2013-06-21 DIAGNOSIS — I951 Orthostatic hypotension: Secondary | ICD-10-CM

## 2013-06-21 NOTE — Patient Instructions (Addendum)
PLAN: HOLD YOUR BLOOD PRESSURE MEDICINES UNTIL YOU CAN BE EVALUATED BY DR. Christell Constant. YOU ARE HAVING LOW BLOOD PRESSURE TODAY.  Continue clopidogrel 75 mg orally every day  for secondary stroke prevention and maintain strict control of hypertension with blood pressure goal below 130/90, and lipids with LDL cholesterol goal below 100 mg/dL.   We will schedule you for Carotid doppler study, someone will call you to schedule this. Followup in the future with me in 3 months.

## 2013-06-21 NOTE — Progress Notes (Signed)
[facility]  PATIENT: Cindy Duran DOB: 03-11-1936   REASON FOR VISIT: follow up HISTORY FROM: patient  HISTORY OF PRESENT ILLNESS: Cindy Duran is an 77 y.o. female with a past medical history significant for HTN, hyperlipidemia, episodic migraine without aura, thyroid disease, who was in her usual state of health until 5:30 pm on  04/14/2013 when she developed abrupt onset of left hand and face numbness, left face droopiness, and slurred speech. Never had similar symptoms before. She recalls picking up her grandson, sitting in a chair, and then experiencing numbness of the left hand and face. She said that she couldn't speak properly and when she talked to her daughter on the phone "she sound it like a drunk person". Her son said that when he arrived home he notice that " her smile was not symmetric". The episode lasted for about 15 or 20 minutes and resolved. She presented to the ED 04/14/2013 1852.  Cindy Duran said that she has been having headaches but never had had a migraine with the above mentioned symptoms. Denies vertigo, double vision, difficulty swallowing, focal weakness, confusion, or visual disturbances. CT brain revealed no acute abnormality. NIHSS 0. Mra Head Wo Contrast was Negative. MRI of the head showed atrophy and chronic microvascular ischemia, but no acute abnormality. 2D Echo showed ejection fraction was in the range of 60% to 65%. Wall motion was normal.  Carotid dopplers not completed.  Cindy Duran is light-headed and hypotensive in office today, she nearly blacked out twice.  Her lying BP is 108/69 with HR 82, but then her sitting pressure was 92/58  HR 110 after several minutes.  States she has been taking her normal blood pressure medicines but since being in the hospital she is taking them every day (where she used to take them EVERY OTHER DAY).  She states she has been often nauseous for the past few months and has had unintended weight-loss.  She has  stopped taking the aspirin-combo medication for her headaches and is taking daily plavix, with no signs of excessive bruising. She states she rarely drinks water, but drinks usually 2 cans of Mt. Dew throughout the day.   REVIEW OF SYSTEMS: Full 14 system review of systems performed and notable only for:  Constitutional: weight loss Ear/Nose/Throat: hearing loss, clicking in ears Skin: birth marks  Respiratory: snoring Hematology/Lymphatic: easy bruising Endocrine: feeling hot Musculoskeletal: joint pain, joint swelling Allergy/Immunology: allergies, skin sensitivity Neurological: headache, weakness, dizziness Psychiatric: change in appetitie Sleep: snoring   ALLERGIES: Allergies  Allergen Reactions  . Iodine Other (See Comments)    "skin comes off"  . Latex Other (See Comments)    Skin peeling off  . Meloxicam Other (See Comments)    Pt can't remember rxn  . Meperidine Hcl Nausea And Vomiting  . Morphine Nausea And Vomiting  . Other     Clorox Bleach  . Penicillins Hives  . Rosuvastatin Other (See Comments)    Pt can't remember rxn  . Simvastatin Other (See Comments)    Pt can't remember rxn  . Sulfonamide Derivatives Swelling    HOME MEDICATIONS: Outpatient Prescriptions Prior to Visit  Medication Sig Dispense Refill  . amLODipine (NORVASC) 5 MG tablet Take 1 tablet (5 mg total) by mouth daily.  90 tablet  1  . atorvastatin (LIPITOR) 20 MG tablet Take 1 tablet (20 mg total) by mouth daily.  90 tablet  1  . Cholecalciferol (VITAMIN D-3) 5000 UNITS TABS Take 2 capsules  by mouth daily.      . diazepam (VALIUM) 5 MG tablet Take 1 tablet (5 mg total) by mouth daily as needed for anxiety.  30 tablet  2  . levothyroxine (SYNTHROID) 175 MCG tablet Take 1 tablet (175 mcg total) by mouth daily before breakfast.  30 tablet  4  . levothyroxine (SYNTHROID, LEVOTHROID) 25 MCG tablet Take 25 mcg by mouth daily before breakfast. Monday thru Friday take a half 12.5mg .  On Saturday and  Sunday take the whole 25mg . Take along with the 175mg .      . NON FORMULARY Take 1-2 tablets by mouth daily as needed. Equate headache relief- contains acetaminophen, asa, caffiene      . promethazine (PHENERGAN) 25 MG tablet Take 25 mg by mouth daily as needed for nausea.      . traMADol (ULTRAM) 50 MG tablet Take 1 tablet (50 mg total) by mouth daily as needed for pain.  90 tablet  0  . valsartan-hydrochlorothiazide (DIOVAN-HCT) 320-25 MG per tablet Take 1 tablet by mouth daily.       No facility-administered medications prior to visit.    PAST MEDICAL HISTORY: Past Medical History  Diagnosis Date  . Hypertension   . Migraine   . Thyroid disease   . High cholesterol     PAST SURGICAL HISTORY: Past Surgical History  Procedure Laterality Date  . Abdominal hysterectomy    . Appendectomy    . Shoulder surgery    . Rotator cuff repair      FAMILY HISTORY: Family History  Problem Relation Age of Onset  . Stroke Mother   . Alzheimer's disease Mother   . Stroke Father   . Heart attack Brother   . Heart attack Maternal Aunt   . Stroke Maternal Aunt     SOCIAL HISTORY: History   Social History  . Marital Status: Widowed    Spouse Name: N/A    Number of Children: 3  . Years of Education: [employer]   Occupational History  . Retired    Social History Main Topics  . Smoking status: Never Smoker   . Smokeless tobacco: Never Used  . Alcohol Use: No  . Drug Use: No  . Sexual Activity: Not on file   Other Topics Concern  . Not on file   Social History Narrative   Patient lives with daughters.   Caffeine Use: 1.5 Mountain Dew 12oz daily   PHYSICAL EXAM  Filed Vitals:   06/21/13 1604  BP: 108/69  Pulse: 82  Height: 5\' 6"  (1.676 m)  Weight: 180 lb (81.647 kg)   Body mass index is 29.07 kg/(m^2).  Generalized: Well developed, pale, feeling faint, almost blacked out at weigh station and again once in exam room. Head: normocephalic and atraumatic. Oropharynx  benign  Neck: Supple, no carotid bruits  Cardiac: Tachycardic, no murmur   Neurological examination  Mentation: Alert oriented to time, place, history taking. Follows all commands speech and language fluent Cranial nerve II-XII:  .Pupils were equal round reactive to light extraocular movements were full, visual field were full on confrontational test. Facial sensation and strength were normal. hearing was intact to finger rubbing bilaterally. Uvula tongue midline. head turning and shoulder shrug and were normal and symmetric. Motor: normal bulk and tone, full strength in the BUE, BLE, fine finger movements normal, no pronator drift. No focal weakness Sensory: normal and symmetric to light touch, pinprick, and  vibration  Coordination: normal finger-nose-finger, heel-to-shin bilaterally, no dysmetria Reflexes:  1+ and symmetric Gait and Station: Rising up from seated position without assistance, but quickly gets lightheaded and almost blacks out.  DIAGNOSTIC DATA (LABS, IMAGING, TESTING) - I reviewed patient records, labs, notes, testing and imaging myself where available.  Lab Results  Component Value Date   WBC 8.4 04/14/2013   HGB 13.9 04/14/2013   HCT 41.0 04/14/2013   MCV 86.2 04/14/2013   PLT 222 04/14/2013      Component Value Date/Time   NA 141 04/14/2013 2246   K 4.2 04/14/2013 2246   CL 107 04/14/2013 2246   CO2 25 04/14/2013 2034   GLUCOSE 92 04/14/2013 2246   BUN 14 04/14/2013 2246   CREATININE 0.90 04/14/2013 2246   CALCIUM 9.4 04/14/2013 2034   GFRNONAA 67* 04/14/2013 2034   GFRAA 77* 04/14/2013 2034   Lab Results  Component Value Date   CHOL 162 04/15/2013   HDL 42 04/15/2013   LDLCALC 102* 04/15/2013   TRIG 90 04/15/2013   CHOLHDL 3.9 04/15/2013   Lab Results  Component Value Date   HGBA1C 5.5 04/14/2013   No results found for this basename: [id number]   Lab Results  Component Value Date   TSH 5.337* 04/15/2013    ASSESSMENT AND PLAN Cindy Duran is a 77 y.o.  female presenting with transient dysarthria, left hand-face numbness, left face droop. CT shows old left external capsule lacunar infarct, MRI neagtive for acute infarct.  Patient with no resultant neuro deficits.  She is experiencing frequent nausea and orthostatic hypotension.  PLAN: HOLD YOUR BLOOD PRESSURE MEDICINES UNTIL YOU CAN BE EVALUATED BY DR. Christell Constant. Check Carotid Dopplers. Continue clopidogrel 75 mg orally every day for secondary stroke prevention and maintain strict control of hypertension with blood pressure goal below 130/90, and lipids with LDL cholesterol goal below 100 mg/dL.  Followup in the future with me in 3 months.  Orders Placed This Encounter  Procedures  . US Carotid Duplex Bilateral   Cindy Asal Janeliz Prestwood, MSN, NP-C 06/21/2013, 4:29 PM [facility] [address], [address] [place], [place] [address] [phone]

## 2013-06-23 ENCOUNTER — Encounter: Payer: Self-pay | Admitting: Family Medicine

## 2013-06-23 ENCOUNTER — Ambulatory Visit (INDEPENDENT_AMBULATORY_CARE_PROVIDER_SITE_OTHER): Payer: Medicare Other | Admitting: Family Medicine

## 2013-06-23 VITALS — BP 90/58 | HR 75 | Temp 98.3°F | Ht 66.0 in | Wt 180.0 lb

## 2013-06-23 DIAGNOSIS — I951 Orthostatic hypotension: Secondary | ICD-10-CM

## 2013-06-23 DIAGNOSIS — I6381 Other cerebral infarction due to occlusion or stenosis of small artery: Secondary | ICD-10-CM

## 2013-06-23 DIAGNOSIS — R55 Syncope and collapse: Secondary | ICD-10-CM

## 2013-06-23 DIAGNOSIS — Z23 Encounter for immunization: Secondary | ICD-10-CM

## 2013-06-23 DIAGNOSIS — R531 Weakness: Secondary | ICD-10-CM

## 2013-06-23 DIAGNOSIS — G459 Transient cerebral ischemic attack, unspecified: Secondary | ICD-10-CM

## 2013-06-23 DIAGNOSIS — I6350 Cerebral infarction due to unspecified occlusion or stenosis of unspecified cerebral artery: Secondary | ICD-10-CM

## 2013-06-23 DIAGNOSIS — R5381 Other malaise: Secondary | ICD-10-CM

## 2013-06-23 NOTE — Progress Notes (Signed)
Subjective:    Patient ID: Cindy Duran, female    DOB: 04-Mar-1936, 77 y.o.   MRN: [id number]  HPI Patient here today for weakness and recent "black outs". Neurology office advised her to see PCP. Please see the recent note from the neurologist office. At that appointment her blood pressure was low especially with standing and the provider recommended that she stop all of her blood pressure medicines and come in for [place] to recheck her. Also in the note it was noted that she had a tachycardia and that she been drinking 2 cans of [facility] daily.     Patient Active Problem List   Diagnosis Date Noted  . TIA (transient ischemic attack) 04/16/2013  . Other and unspecified hyperlipidemia 04/16/2013  . HYPERTENSION, BENIGN 05/30/2009  . SHORTNESS OF BREATH 05/30/2009   Outpatient Encounter Prescriptions as of 06/23/2013  Medication Sig  . amLODipine (NORVASC) 5 MG tablet Take 1 tablet (5 mg total) by mouth daily.  Marland Kitchen atorvastatin (LIPITOR) 20 MG tablet Take 1 tablet (20 mg total) by mouth daily.  . Cholecalciferol (VITAMIN D-3) 5000 UNITS TABS Take 2 capsules by mouth daily.  . diazepam (VALIUM) 5 MG tablet Take 1 tablet (5 mg total) by mouth daily as needed for anxiety.  Marland Kitchen levothyroxine (SYNTHROID) 175 MCG tablet Take 1 tablet (175 mcg total) by mouth daily before breakfast.  . levothyroxine (SYNTHROID, LEVOTHROID) 25 MCG tablet Take 25 mcg by mouth daily before breakfast. Monday thru Friday take a half 12.5mg .  On Saturday and Sunday take the whole 25mg . Take along with the 175mg .  . NON FORMULARY Take 1-2 tablets by mouth daily as needed. Equate headache relief- contains acetaminophen, asa, caffiene  . promethazine (PHENERGAN) 25 MG tablet Take 25 mg by mouth daily as needed for nausea.  . traMADol (ULTRAM) 50 MG tablet Take 1 tablet (50 mg total) by mouth daily as needed for pain.  . valsartan-hydrochlorothiazide (DIOVAN-HCT) 320-25 MG per tablet Take 1 tablet by mouth daily.    Review of  Systems  Constitutional: Positive for fatigue.  HENT: Positive for voice change.   Eyes: Negative.   Respiratory: Positive for shortness of breath (hx of asthma).   Cardiovascular: Negative.   Gastrointestinal: Negative.   Endocrine: Negative.   Genitourinary: Negative.   Musculoskeletal: Negative.   Skin: Negative.   Allergic/Immunologic: Negative.   Neurological: Positive for dizziness, tremors (worse on left side), weakness and headaches.  Hematological: Negative.   Psychiatric/Behavioral: Negative.        Objective:   Physical Exam  Nursing note and vitals reviewed. Constitutional: She is oriented to person, place, and time. She appears well-developed and well-nourished. No distress.  HENT:  Head: Normocephalic and atraumatic.  Right Ear: External ear normal.  Left Ear: External ear normal.  Nose: Nose normal.  Mouth/Throat: Oropharynx is clear and moist. No oropharyngeal exudate.  Eyes: Conjunctivae and EOM are normal. Pupils are equal, round, and reactive to light. Right eye exhibits no discharge. Left eye exhibits no discharge. No scleral icterus.  Neck: Normal range of motion. Neck supple. No thyromegaly present.  No carotid bruits auscultated  Cardiovascular: Normal rate, regular rhythm, normal heart sounds and intact distal pulses.  Exam reveals no gallop and no friction rub.   No murmur heard. At 84 per minute  Pulmonary/Chest: Effort normal and breath sounds normal. No respiratory distress. She has no wheezes. She has no rales.  Abdominal: Soft. Bowel sounds are normal. She exhibits no mass. There is no  tenderness. There is no rebound and no guarding.  Musculoskeletal: Normal range of motion. She exhibits no edema and no tenderness.  Patient has good strength in both upper and lower extremity  Lymphadenopathy:    She has no cervical adenopathy.  Neurological: She is alert and oriented to person, place, and time. She has normal reflexes. She displays normal  reflexes. No cranial nerve deficit. She exhibits normal muscle tone.  Skin: Skin is warm and dry.  Psychiatric: She has a normal mood and affect. Her behavior is normal. Judgment and thought content normal.   BP 111/66  Pulse 75  Temp(Src) 98.3 F (36.8 C) (Oral)  Ht 5\' 6"  (1.676 m)  Wt 180 lb (81.647 kg)  BMI 29.07 kg/m2  EKG: Occasional PACs and T-wave changes, otherwise normal       Assessment & Plan:   1. Postural hypotension   2. Black-out (not amnesia)   3. Weakness   4. TIA (transient ischemic attack)   5. Lacunar stroke    Orders Placed This Encounter  Procedures  . BMP8+EGFR  . POCT CBC  . EKG 12-Lead   No orders of the defined types were placed in this encounter.   Patient Instructions  Continue current medications. Continue to hold and not take blood pressure medication Continue good therapeutic lifestyle changes.  Fall precautions discussed with patient. Follow up as planned and earlier as needed.  OMRON BP cuff ([facility] has these) Reduce and discontinue caffeine Put A good pair of above-the-knee support hose on every morning before you get out of bed Always move slowly. For example when you get up in the morning, sit on the side of the bed for a few minutes, stand beside the bed for a few minutes and then move slowly away Monitor blood pressures and pulse  rates both sitting and standing, sometimes checking it in the morning other times checking it in the afternoon Please bring these readings for review and a couple of weeks     Nyra Capes MD

## 2013-06-23 NOTE — Progress Notes (Signed)
Appt scheduled for today.

## 2013-06-23 NOTE — Patient Instructions (Addendum)
Continue current medications. Continue to hold and not take blood pressure medication Continue good therapeutic lifestyle changes.  Fall precautions discussed with patient. Follow up as planned and earlier as needed.  OMRON BP cuff ([facility] [employer] has these) Reduce and discontinue caffeine Put A good pair of above-the-knee support hose on every morning before you get out of bed Always move slowly. For example when you get up in the morning, sit on the side of the bed for a few minutes, stand beside the bed for a few minutes and then move slowly away Monitor blood pressures and pulse  rates both sitting and standing, sometimes checking it in the morning other times checking it in the afternoon Please bring these readings for review and a couple of weeks Use walker at home until more stable.

## 2013-06-24 LAB — BMP8+EGFR
BUN/Creatinine Ratio: 15 (ref 11–26)
BUN: 16 mg/dL (ref 8–27)
CO2: 28 mmol/L (ref 18–29)
Calcium: 9.1 mg/dL (ref 8.6–10.2)
Chloride: 102 mmol/L (ref 97–108)
Creatinine, Ser: 1.07 mg/dL — ABNORMAL HIGH (ref 0.57–1.00)
Sodium: 144 mmol/L (ref 134–144)

## 2013-07-18 ENCOUNTER — Other Ambulatory Visit: Payer: Self-pay

## 2013-07-18 MED ORDER — LEVOTHYROXINE SODIUM 25 MCG PO TABS: 25.0000 ug | ORAL_TABLET | Freq: Every day | ORAL | Status: DC

## 2013-07-26 ENCOUNTER — Other Ambulatory Visit: Payer: Medicare Other

## 2013-07-29 ENCOUNTER — Other Ambulatory Visit: Payer: Self-pay

## 2013-07-29 MED ORDER — LEVOTHYROXINE SODIUM 25 MCG PO TABS: 25.0000 ug | ORAL_TABLET | Freq: Every day | ORAL | Status: DC

## 2013-08-01 ENCOUNTER — Telehealth: Payer: Self-pay | Admitting: Family Medicine

## 2013-08-01 ENCOUNTER — Encounter: Payer: Self-pay | Admitting: Internal Medicine

## 2013-08-04 ENCOUNTER — Telehealth: Payer: Self-pay | Admitting: Nurse Practitioner

## 2013-08-04 MED ORDER — TRAMADOL HCL 50 MG PO TABS: 50.0000 mg | ORAL_TABLET | Freq: Every day | ORAL | Status: DC | PRN

## 2013-08-04 NOTE — Telephone Encounter (Signed)
Omron is the name of the blood pressure monitor

## 2013-08-04 NOTE — Telephone Encounter (Signed)
rx ready for pickup 

## 2013-08-08 NOTE — Telephone Encounter (Signed)
Pt.notified

## 2013-08-09 ENCOUNTER — Ambulatory Visit (INDEPENDENT_AMBULATORY_CARE_PROVIDER_SITE_OTHER): Payer: Medicare Other

## 2013-08-09 DIAGNOSIS — G459 Transient cerebral ischemic attack, unspecified: Secondary | ICD-10-CM

## 2013-08-23 ENCOUNTER — Ambulatory Visit (INDEPENDENT_AMBULATORY_CARE_PROVIDER_SITE_OTHER): Payer: Medicare Other | Admitting: Family Medicine

## 2013-08-23 ENCOUNTER — Encounter: Payer: Self-pay | Admitting: Nurse Practitioner

## 2013-08-23 ENCOUNTER — Encounter: Payer: Self-pay | Admitting: Family Medicine

## 2013-08-23 ENCOUNTER — Telehealth: Payer: Self-pay | Admitting: Nurse Practitioner

## 2013-08-23 ENCOUNTER — Telehealth: Payer: Self-pay | Admitting: Family Medicine

## 2013-08-23 VITALS — BP 146/75 | HR 85 | Temp 97.8°F | Ht 66.0 in | Wt 184.0 lb

## 2013-08-23 DIAGNOSIS — R519 Headache, unspecified: Secondary | ICD-10-CM

## 2013-08-23 DIAGNOSIS — R51 Headache: Secondary | ICD-10-CM

## 2013-08-23 MED ORDER — TOPIRAMATE 50 MG PO TABS: 50.0000 mg | ORAL_TABLET | Freq: Two times a day (BID) | ORAL | Status: DC

## 2013-08-23 MED ORDER — BUTALBITAL-APAP-CAFFEINE 50-325-40 MG PO TABS: 1.0000 | ORAL_TABLET | Freq: Four times a day (QID) | ORAL | Status: DC | PRN

## 2013-08-23 NOTE — Patient Instructions (Signed)
Headaches, Analgesic Rebound Analgesic agents are prescription or over-the-counter medications used to control pain, including headaches. However, overuse or misuse of theses medications can lead to rebound headaches. Rebound headaches are headaches that recur after the analgesic medication wears off. Eventually, the rebound headaches can become longlasting (chronic). If this happens, you must completely stop using analgesic medications. If not, the chronic headache is likely to continue despite the use of any other treatment. Usually when you stop taking analgesic medications, the headache may initally get worse for several days. Along with this you may experience sickness in your stomach (nausea), and you may throw up (vomit). After a period of 3 to 5 days, these symptoms begin to improve. Sometimes improvement may take longer. Eventually, the headaches will slowly improve with treatment with the right medications. Most people are able to stop using analgesic medications at home with a caregiver's supervision. But some find it difficult and may require hospitalization. Document Released: 10/25/2003 Document Revised: 10/27/2011 Document Reviewed: 02/17/2013 [facility] Patient Information 2014 [place], [place].

## 2013-08-23 NOTE — Telephone Encounter (Signed)
Left message for Cindy Duran stating her carotid doppler results are normal, please keep follow up appointment.

## 2013-08-23 NOTE — Progress Notes (Signed)
   Subjective:    Patient ID: Cindy Duran, female    DOB: 1935/10/20, 78 y.o.   MRN: [id number]  HPI This 78 y.o. female presents for evaluation of migraine headache.  She c/o phonophotophobia Nausea, and pulsatile headache across bilateral frontal region of her head.  She has hx of neck Pain and arthritis and the headaches start out with a stress quality and then convert over to  Migraine.  She has taken caffiene and tylenol product otc which worked well for abortive measures. She is not on any prophylactic medicine for headache.  She has daily headaches.   Review of Systems C/o headache No chest pain, SOB, HA, dizziness, vision change, N/V, diarrhea, constipation, dysuria, urinary urgency or frequency, myalgias, arthralgias or rash.     Objective:   Physical Exam  Vital signs noted  Well developed well nourished female.  HEENT - Head atraumatic Normocephalic                Eyes - PERRLA, Conjuctiva - clear Sclera- Clear EOMI                Ears - EAC's Wnl TM's Wnl Gross Hearing WNL                Nose - Nares patent                 Throat - oropharanx wnl Respiratory - Lungs CTA bilateral Cardiac - RRR S1 and S2 without murmur GI - Abdomen soft Nontender and bowel sounds active x 4 Extremities - No edema. Neuro - Grossly intact.       Assessment & Plan:  Chronic headaches - Plan: butalbital-acetaminophen-caffeine (FIORICET) 50-325-40 MG per tablet, topiramate (TOPAMAX) 50 MG tablet Start taking topamax qhs for a week and then go to bid.  Explained that fioricet is habit forming and to Use sparingly.  Lysbeth Penner FNP

## 2013-08-23 NOTE — Telephone Encounter (Signed)
Patient refused to go to ER and wants to be seen appt at 1 with bill oxford

## 2013-08-23 NOTE — Telephone Encounter (Signed)
I spoke with Rodena Piety and advised her to go to the ER especially with her prior history of having a TIA with her last migraine. I also spoke with Mae the NP and she advised that patient go straight to the ER also with her history.

## 2013-08-25 ENCOUNTER — Telehealth: Payer: Self-pay | Admitting: Family Medicine

## 2013-08-25 NOTE — Telephone Encounter (Signed)
Please call patient and tell her to try the Topamax and see what that is and we might not need the Fioricet

## 2013-08-25 NOTE — Telephone Encounter (Signed)
Patient called and aware we are working on alternative to fioricet.  She has already paid for the medication but I told her we would still check with insurance for next time.

## 2013-08-25 NOTE — Telephone Encounter (Signed)
Im not sure what alternative would be covered by her insurance.  I will forward to Zadie Rhine to see if she can investigate this further.   I called [facility] and cash price for #21 is $29.66.   I appears that Cindy Duran has started her on topamax in hopes that will prevention HA and so that she will need the generic foricet less.

## 2013-08-26 NOTE — Telephone Encounter (Signed)
PT NOTIFIED AND VERBALIZED UNDERSTANDING. 

## 2013-09-23 ENCOUNTER — Ambulatory Visit: Payer: Medicare Other | Admitting: Nurse Practitioner

## 2013-10-12 ENCOUNTER — Ambulatory Visit (INDEPENDENT_AMBULATORY_CARE_PROVIDER_SITE_OTHER): Payer: Medicare Other

## 2013-10-12 ENCOUNTER — Ambulatory Visit (INDEPENDENT_AMBULATORY_CARE_PROVIDER_SITE_OTHER): Payer: Medicare Other | Admitting: Family Medicine

## 2013-10-12 ENCOUNTER — Encounter: Payer: Self-pay | Admitting: Family Medicine

## 2013-10-12 VITALS — BP 109/65 | HR 69 | Temp 97.5°F | Ht 66.0 in | Wt 169.0 lb

## 2013-10-12 DIAGNOSIS — M25562 Pain in left knee: Secondary | ICD-10-CM

## 2013-10-12 DIAGNOSIS — M25569 Pain in unspecified knee: Secondary | ICD-10-CM

## 2013-10-12 DIAGNOSIS — Z780 Asymptomatic menopausal state: Secondary | ICD-10-CM

## 2013-10-12 DIAGNOSIS — I10 Essential (primary) hypertension: Secondary | ICD-10-CM

## 2013-10-12 DIAGNOSIS — E785 Hyperlipidemia, unspecified: Secondary | ICD-10-CM

## 2013-10-12 DIAGNOSIS — Z23 Encounter for immunization: Secondary | ICD-10-CM

## 2013-10-12 DIAGNOSIS — E559 Vitamin D deficiency, unspecified: Secondary | ICD-10-CM

## 2013-10-12 DIAGNOSIS — G459 Transient cerebral ischemic attack, unspecified: Secondary | ICD-10-CM

## 2013-10-12 DIAGNOSIS — M1990 Unspecified osteoarthritis, unspecified site: Secondary | ICD-10-CM

## 2013-10-12 MED ORDER — CLOPIDOGREL BISULFATE 75 MG PO TABS: 75.0000 mg | ORAL_TABLET | Freq: Every day | ORAL | Status: DC

## 2013-10-12 MED ORDER — LEVOTHYROXINE SODIUM 200 MCG PO TABS: 200.0000 ug | ORAL_TABLET | Freq: Every day | ORAL | Status: DC

## 2013-10-12 MED ORDER — TRAMADOL HCL 50 MG PO TABS: 50.0000 mg | ORAL_TABLET | Freq: Every day | ORAL | Status: DC | PRN

## 2013-10-12 MED ORDER — DIAZEPAM 5 MG PO TABS: 5.0000 mg | ORAL_TABLET | Freq: Every day | ORAL | Status: DC | PRN

## 2013-10-12 MED ORDER — PROMETHAZINE HCL 25 MG PO TABS: 25.0000 mg | ORAL_TABLET | Freq: Every day | ORAL | Status: DC | PRN

## 2013-10-12 MED ORDER — ATORVASTATIN CALCIUM 20 MG PO TABS: 20.0000 mg | ORAL_TABLET | Freq: Every day | ORAL | Status: DC

## 2013-10-12 NOTE — Patient Instructions (Addendum)
Continue current medications. Continue good therapeutic lifestyle changes which include good diet and exercise. Fall precautions discussed with patient. If an FOBT was given today- please return it to our front desk. If you are over 76 years old - you may need Prevnar 44 or the adult Pneumonia vaccine. The Prevnar vaccine that he received today may make her arm sore                       Medicare Annual Wellness Visit  [place] and the medical providers at [facility] strive to bring you the best medical care.  In doing so we not only want to address your current medical conditions and concerns but also to detect new conditions early and prevent illness, disease and health-related problems.    Medicare offers a yearly Wellness Visit which allows our clinical staff to assess your need for preventative services including immunizations, lifestyle education, counseling to decrease risk of preventable diseases and screening for fall risk and other medical concerns.    This visit is provided free of charge (no copay) for all Medicare recipients. The clinical pharmacists at [facility] have begun to conduct these Wellness Visits which will also include a thorough review of all your medications.    As you primary medical provider recommend that you make an appointment for your Annual Wellness Visit if you have not done so already this year.  You may set up this appointment before you leave today or you may call back ([phone]) and schedule an appointment.  Please make sure when you call that you mention that you are scheduling your Annual Wellness Visit with the clinical pharmacist so that the appointment may be made for the proper length of time.

## 2013-10-12 NOTE — Addendum Note (Signed)
Addended by: Zannie Cove on: 10/12/2013 03:41 PM   Modules accepted: Orders

## 2013-10-12 NOTE — Addendum Note (Signed)
Addended by: Zannie Cove on: 10/12/2013 03:25 PM   Modules accepted: Orders

## 2013-10-12 NOTE — Progress Notes (Signed)
Subjective:    Patient ID: Cindy Duran, female    DOB: 1936/05/11, 78 y.o.   MRN: [id number]  HPI Pt here for follow up and management of chronic medical problems. The patient still complains of joint problems. She sustained a fall and had pain in her left knee. This has improved. She is due to receive a Prevnar today and she will be given an FOBT to return. She will come back for fasting lab work. She needs to have a mammogram and a DEXA scan and she will get a chest x-ray today. The patient attends the visit with her granddaughter today. They both indicate that she has just started feeling more normal since his TIA in August 2000 410. This sense of feeling more normal has just occurred recently. She indicates that her appetite has improved her emotional stability and lack of irritability have improved. She requests that we change her Synthroid medication to once daily which we will place an order for today.       Patient Active Problem List   Diagnosis Date Noted  . TIA (transient ischemic attack) 04/16/2013  . Other and unspecified hyperlipidemia 04/16/2013  . HYPERTENSION, BENIGN 05/30/2009  . SHORTNESS OF BREATH 05/30/2009   Outpatient Encounter Prescriptions as of 10/12/2013  Medication Sig  . atorvastatin (LIPITOR) 20 MG tablet Take 1 tablet (20 mg total) by mouth daily.  . Cholecalciferol (VITAMIN D-3) 5000 UNITS TABS Take 2 capsules by mouth daily.  . clopidogrel (PLAVIX) 75 MG tablet Take 75 mg by mouth daily with breakfast.  . diazepam (VALIUM) 5 MG tablet Take 1 tablet (5 mg total) by mouth daily as needed for anxiety.  Marland Kitchen levothyroxine (SYNTHROID) 175 MCG tablet Take 1 tablet (175 mcg total) by mouth daily before breakfast.  . levothyroxine (SYNTHROID, LEVOTHROID) 25 MCG tablet Take 1 tablet (25 mcg total) by mouth daily before breakfast. Monday thru Friday take a half 12.19m.  On Saturday and Sunday take the whole 223m Take along with the 17565m . promethazine (PHENERGAN)  25 MG tablet Take 25 mg by mouth daily as needed for nausea.  . traMADol (ULTRAM) 50 MG tablet Take 1 tablet (50 mg total) by mouth daily as needed.  . [DISCONTINUED] butalbital-acetaminophen-caffeine (FIORICET) 50-325-40 MG per tablet Take 1-2 tablets by mouth every 6 (six) hours as needed for headache.  . [DISCONTINUED] topiramate (TOPAMAX) 50 MG tablet Take 1 tablet (50 mg total) by mouth 2 (two) times daily.    Review of Systems  Constitutional: Negative.   Eyes: Negative.   Respiratory: Negative.   Cardiovascular: Negative.   Gastrointestinal: Negative.   Endocrine: Negative.   Genitourinary: Negative.   Musculoskeletal: Negative.  Arthralgias: left knee worse- 75% better now since fall.  Skin: Negative.   Allergic/Immunologic: Negative.   Neurological: Positive for headaches.  Hematological: Negative.   Psychiatric/Behavioral: Negative.        Objective:   Physical Exam  Nursing note and vitals reviewed. Constitutional: She is oriented to person, place, and time. She appears well-developed and well-nourished. No distress.  Pleasant  HENT:  Head: Normocephalic and atraumatic.  Right Ear: External ear normal.  Left Ear: External ear normal.  Nose: Nose normal.  Mouth/Throat: Oropharynx is clear and moist. No oropharyngeal exudate.  Eyes: Conjunctivae and EOM are normal. Pupils are equal, round, and reactive to light. Right eye exhibits no discharge. Left eye exhibits no discharge. No scleral icterus.  Neck: Normal range of motion. Neck supple. No thyromegaly present.  No carotid bruits  Cardiovascular: Normal rate, regular rhythm, normal heart sounds and intact distal pulses.  Exam reveals no gallop and no friction rub.   No murmur heard. At 72 per minute  Pulmonary/Chest: Effort normal and breath sounds normal. No respiratory distress. She has no wheezes. She has no rales. She exhibits no tenderness.  Abdominal: Soft. Bowel sounds are normal. She exhibits no mass. There  is no tenderness. There is no rebound and no guarding.  Mild obesity  Genitourinary:  Breast exam today revealed no breast masses no axillary adenopathy and no abnormalities.  Musculoskeletal: Normal range of motion. She exhibits no edema and no tenderness.  The patient does have back pain with sitting and laying. She also has knee pain with some limited range of motion.  Lymphadenopathy:    She has no cervical adenopathy.  Neurological: She is alert and oriented to person, place, and time. She has normal reflexes. No cranial nerve deficit.  Skin: Skin is warm and dry.  Psychiatric: She has a normal mood and affect. Her behavior is normal. Judgment and thought content normal.   BP 109/65  Pulse 69  Temp(Src) 97.5 F (36.4 C) (Oral)  Ht '5\' 6"'  (1.676 m)  Wt 169 lb (76.658 kg)  BMI 27.29 kg/m2  [facility] reading (PRIMARY) by  Dr.Cheryln Balcom-left knee and chest x-ray                                       Assessment & Plan:  1. HYPERTENSION, BENIGN - DG Chest 2 View; Future - POCT CBC; Future - BMP8+EGFR; Future - Hepatic function panel; Future  2. Other and unspecified hyperlipidemia - POCT CBC; Future - NMR, lipoprofile; Future  3. Vitamin D deficiency - POCT CBC; Future - Vit D  25 hydroxy (rtn osteoporosis monitoring); Future  4. Left knee pain - DG Knee 1-2 Views Left; Future - POCT CBC; Future  5. TIA (transient ischemic attack)  6. Need for prophylactic vaccination against Streptococcus pneumoniae (pneumococcus) - Pneumococcal conjugate vaccine 13-valent  7. Osteoarthritis   Patient Instructions  Continue current medications. Continue good therapeutic lifestyle changes which include good diet and exercise. Fall precautions discussed with patient. If an FOBT was given today- please return it to our front desk. If you are over 62 years old - you may need Prevnar 17 or the adult Pneumonia vaccine. The Prevnar vaccine that he received today may make her arm sore                        Medicare Annual Wellness Visit  [place] and the medical providers at [facility] strive to bring you the best medical care.  In doing so we not only want to address your current medical conditions and concerns but also to detect new conditions early and prevent illness, disease and health-related problems.    Medicare offers a yearly Wellness Visit which allows our clinical staff to assess your need for preventative services including immunizations, lifestyle education, counseling to decrease risk of preventable diseases and screening for fall risk and other medical concerns.    This visit is provided free of charge (no copay) for all Medicare recipients. The clinical pharmacists at [facility] have begun to conduct these Wellness Visits which will also include a thorough review of all your medications.    As you primary medical  provider recommend that you make an appointment for your Annual Wellness Visit if you have not done so already this year.  You may set up this appointment before you leave today or you may call back ([phone]) and schedule an appointment.  Please make sure when you call that you mention that you are scheduling your Annual Wellness Visit with the clinical pharmacist so that the appointment may be made for the proper length of time.         Arrie Senate MD

## 2013-10-12 NOTE — Addendum Note (Signed)
Addended by: Zannie Cove on: 10/12/2013 03:55 PM   Modules accepted: Orders

## 2013-10-20 ENCOUNTER — Other Ambulatory Visit: Payer: Self-pay | Admitting: *Deleted

## 2013-10-20 ENCOUNTER — Other Ambulatory Visit (INDEPENDENT_AMBULATORY_CARE_PROVIDER_SITE_OTHER): Payer: Medicare Other

## 2013-10-20 DIAGNOSIS — E785 Hyperlipidemia, unspecified: Secondary | ICD-10-CM

## 2013-10-20 DIAGNOSIS — E559 Vitamin D deficiency, unspecified: Secondary | ICD-10-CM

## 2013-10-20 DIAGNOSIS — M25562 Pain in left knee: Secondary | ICD-10-CM

## 2013-10-20 DIAGNOSIS — I10 Essential (primary) hypertension: Secondary | ICD-10-CM

## 2013-10-20 DIAGNOSIS — M25569 Pain in unspecified knee: Secondary | ICD-10-CM

## 2013-10-20 LAB — POCT CBC
Granulocyte percent: 57.9 %G (ref 37–80)
HCT, POC: 41.8 % (ref 37.7–47.9)
HEMOGLOBIN: 13.4 g/dL (ref 12.2–16.2)
Lymph, poc: 1.9 (ref 0.6–3.4)
MCH: 26.8 pg — AB (ref 27–31.2)
MCHC: 32 g/dL (ref 31.8–35.4)
MCV: 83.7 fL (ref 80–97)
MPV: 8.9 fL (ref 0–99.8)
POC Granulocyte: 3.1 (ref 2–6.9)
POC LYMPH PERCENT: 35.5 %L (ref 10–50)
Platelet Count, POC: 213 10*3/uL (ref 142–424)
RBC: 5 M/uL (ref 4.04–5.48)
RDW, POC: 14.6 %
WBC: 5.4 10*3/uL (ref 4.6–10.2)

## 2013-10-20 MED ORDER — MECLIZINE HCL 12.5 MG PO TABS: 12.5000 mg | ORAL_TABLET | Freq: Three times a day (TID) | ORAL | Status: DC | PRN

## 2013-10-21 LAB — BMP8+EGFR
BUN/Creatinine Ratio: 16 (ref 11–26)
BUN: 11 mg/dL (ref 8–27)
CO2: 24 mmol/L (ref 18–29)
CREATININE: 0.7 mg/dL (ref 0.57–1.00)
Calcium: 9.5 mg/dL (ref 8.7–10.3)
Chloride: 106 mmol/L (ref 97–108)
GFR calc Af Amer: 98 mL/min/{1.73_m2} (ref >59)
GFR, EST NON AFRICAN AMERICAN: 85 mL/min/{1.73_m2} (ref >59)
Glucose: 90 mg/dL (ref 65–99)
Potassium: 4 mmol/L (ref 3.5–5.2)
SODIUM: 146 mmol/L — AB (ref 134–144)

## 2013-10-21 LAB — NMR, LIPOPROFILE
Cholesterol: 149 mg/dL (ref <200)
HDL CHOLESTEROL BY NMR: 47 mg/dL (ref >=40)
HDL PARTICLE NUMBER: 29.7 umol/L — AB (ref >=30.5)
LDL Particle Number: 1121 nmol/L — ABNORMAL HIGH (ref <1000)
LDL Size: 21 nm (ref >20.5)
LDLC SERPL CALC-MCNC: 86 mg/dL (ref <100)
LP-IR Score: <25 (ref <=45)
Small LDL Particle Number: 520 nmol/L (ref <=527)
TRIGLYCERIDES BY NMR: 80 mg/dL (ref <150)

## 2013-10-21 LAB — HEPATIC FUNCTION PANEL
ALK PHOS: 107 IU/L (ref 39–117)
ALT: 14 IU/L (ref 0–32)
AST: 16 IU/L (ref 0–40)
Albumin: 3.6 g/dL (ref 3.5–4.8)
BILIRUBIN DIRECT: 0.13 mg/dL (ref 0.00–0.40)
TOTAL PROTEIN: 5.9 g/dL — AB (ref 6.0–8.5)
Total Bilirubin: 0.4 mg/dL (ref 0.0–1.2)

## 2013-10-21 LAB — VITAMIN D 25 HYDROXY (VIT D DEFICIENCY, FRACTURES): Vit D, 25-Hydroxy: 59.9 ng/mL (ref 30.0–100.0)

## 2013-10-31 ENCOUNTER — Encounter: Payer: Self-pay | Admitting: *Deleted

## 2013-11-09 ENCOUNTER — Other Ambulatory Visit: Payer: Self-pay | Admitting: Family Medicine

## 2013-11-16 ENCOUNTER — Other Ambulatory Visit: Payer: Self-pay | Admitting: Pharmacist

## 2013-11-16 ENCOUNTER — Ambulatory Visit (INDEPENDENT_AMBULATORY_CARE_PROVIDER_SITE_OTHER): Payer: Medicare Other | Admitting: Pharmacist

## 2013-11-16 ENCOUNTER — Ambulatory Visit (INDEPENDENT_AMBULATORY_CARE_PROVIDER_SITE_OTHER): Payer: Medicare Other

## 2013-11-16 ENCOUNTER — Other Ambulatory Visit: Payer: Self-pay | Admitting: *Deleted

## 2013-11-16 ENCOUNTER — Encounter: Payer: Self-pay | Admitting: Pharmacist

## 2013-11-16 ENCOUNTER — Other Ambulatory Visit: Payer: Medicare Other

## 2013-11-16 VITALS — Ht 65.75 in | Wt 172.0 lb

## 2013-11-16 DIAGNOSIS — E039 Hypothyroidism, unspecified: Secondary | ICD-10-CM

## 2013-11-16 DIAGNOSIS — M810 Age-related osteoporosis without current pathological fracture: Secondary | ICD-10-CM | POA: Insufficient documentation

## 2013-11-16 DIAGNOSIS — Z780 Asymptomatic menopausal state: Secondary | ICD-10-CM

## 2013-11-16 DIAGNOSIS — M1990 Unspecified osteoarthritis, unspecified site: Secondary | ICD-10-CM

## 2013-11-16 MED ORDER — ALENDRONATE SODIUM 70 MG PO TABS: 70.0000 mg | ORAL_TABLET | ORAL | Status: DC

## 2013-11-16 NOTE — Patient Instructions (Signed)

## 2013-11-16 NOTE — Progress Notes (Signed)
Patient ID: Cindy Duran, female   DOB: 05-10-1936, 78 y.o.   MRN: [id number] [facility] Current Height: Height: 5' 5.75" (167 cm)      Max Lifetime Height: 5\' 6"  Current Weight: Weight: 172 lb (78.019 kg)       Ethnicity:Caucasian    HPI: Does pt already have a diagnosis of:  Osteopenia?  Yes Osteoporosis?  No  Back Pain?  Yes       Kyphosis?  Yes Prior fracture?  No Med(s) for Osteoporosis/Osteopenia:  none Med(s) previously tried for Osteoporosis/Osteopenia:  none                                                             PMH: Age at menopause:  Surgical at 78 yo Hysterectomy?  Yes Oophorectomy? Removed 1 ovary and left 1 ovary HRT? No Steroid Use?  No Thyroid med?  Yes History of cancer?  No History of digestive disorders (ie Crohn's)?  No Current or previous eating disorders?  No Last Vitamin D Result:  59.9 (10/20/2013) Last GFR Result:  85 (10/20/2013)   FH/SH: Family history of osteoporosis?  Yes - mother Parent with history of hip fracture?  Yes - mother Family history of breast cancer?  No Exercise?  Yes but is very limited Smoking?  No Alcohol?  No    Calcium Assessment Calcium Intake  # of servings/day  Calcium mg  Milk (8 oz) 0  x  300  = 0  Yogurt (4 oz) 1 x  200 = 200mg   Cheese (1 oz) 1 x  200 = 200mg   Other Calcium sources   250mg   Ca supplement 0 = 0   Estimated calcium intake per day 650mg     DEXA Results Date of Test T-Score for AP Spine L1-L4 T-Score for Total Left Hip T-Score for Total Right Hip  11/16/2013 0.0 -2.0 -2.3  03/03/2005 0.6 -0.8 --       *T-Score of neck of  Right hip was -2.5 today (11/16/2013)     Assessment: osteoporosis  Recommendations: 1.  Start  alendronate (FOSAMAX) 70mg  1 tablet by mouth weekly 2.  recommend calcium 1200mg  daily through supplementation or diet.  3.  recommend weight bearing exercise - 30 minutes at least 4 days per week.   4.  Counseled and educated about fall risk and  prevention.  Recheck DEXA:  2 years  Time spent counseling patient:  30 minutes  Cherre Robins, PharmD, CPP

## 2013-11-17 ENCOUNTER — Other Ambulatory Visit: Payer: Self-pay | Admitting: Pharmacist

## 2013-11-17 ENCOUNTER — Other Ambulatory Visit (INDEPENDENT_AMBULATORY_CARE_PROVIDER_SITE_OTHER): Payer: Medicare Other

## 2013-11-17 DIAGNOSIS — E039 Hypothyroidism, unspecified: Secondary | ICD-10-CM

## 2013-11-17 MED ORDER — ALENDRONATE SODIUM 70 MG PO TABS: 70.0000 mg | ORAL_TABLET | ORAL | Status: DC

## 2013-11-17 NOTE — Progress Notes (Signed)
Pt came in for labs only 

## 2013-11-18 LAB — THYROID PANEL WITH TSH
Free Thyroxine Index: 4.2 (ref 1.2–4.9)
T3 UPTAKE RATIO: 31 % (ref 24–39)
T4, Total: 13.7 ug/dL — ABNORMAL HIGH (ref 4.5–12.0)
TSH: 0.047 u[IU]/mL — AB (ref 0.450–4.500)

## 2013-12-02 NOTE — Telephone Encounter (Signed)
Medication changed have not heard from pt since

## 2013-12-05 ENCOUNTER — Encounter: Payer: Self-pay | Admitting: Internal Medicine

## 2014-02-08 ENCOUNTER — Ambulatory Visit: Payer: Medicare Other | Admitting: Family Medicine

## 2014-02-13 ENCOUNTER — Ambulatory Visit: Payer: Medicare Other | Admitting: Family Medicine

## 2014-02-14 ENCOUNTER — Telehealth: Payer: Self-pay | Admitting: Family Medicine

## 2014-02-14 NOTE — Telephone Encounter (Signed)
This is okay to refill 

## 2014-02-15 ENCOUNTER — Other Ambulatory Visit: Payer: Self-pay | Admitting: Family Medicine

## 2014-02-15 ENCOUNTER — Other Ambulatory Visit: Payer: Self-pay | Admitting: *Deleted

## 2014-02-15 MED ORDER — PROMETHAZINE HCL 25 MG PO TABS: ORAL_TABLET | ORAL | Status: DC

## 2014-02-15 MED ORDER — TRAMADOL HCL 50 MG PO TABS: 50.0000 mg | ORAL_TABLET | Freq: Every day | ORAL | Status: DC | PRN

## 2014-02-15 NOTE — Telephone Encounter (Signed)
Up front 

## 2014-02-15 NOTE — Telephone Encounter (Signed)
Patient requesting a 90 day supply. Please advise.

## 2014-02-15 NOTE — Telephone Encounter (Signed)
Wait for moore to sign

## 2014-04-12 ENCOUNTER — Ambulatory Visit (INDEPENDENT_AMBULATORY_CARE_PROVIDER_SITE_OTHER): Payer: Medicare Other | Admitting: Family Medicine

## 2014-04-12 ENCOUNTER — Encounter: Payer: Self-pay | Admitting: Family Medicine

## 2014-04-12 VITALS — BP 130/62 | HR 63 | Temp 97.4°F | Ht 65.75 in | Wt 177.0 lb

## 2014-04-12 DIAGNOSIS — I10 Essential (primary) hypertension: Secondary | ICD-10-CM

## 2014-04-12 DIAGNOSIS — G43801 Other migraine, not intractable, with status migrainosus: Secondary | ICD-10-CM | POA: Insufficient documentation

## 2014-04-12 DIAGNOSIS — J301 Allergic rhinitis due to pollen: Secondary | ICD-10-CM | POA: Insufficient documentation

## 2014-04-12 DIAGNOSIS — E785 Hyperlipidemia, unspecified: Secondary | ICD-10-CM

## 2014-04-12 DIAGNOSIS — M159 Polyosteoarthritis, unspecified: Secondary | ICD-10-CM

## 2014-04-12 DIAGNOSIS — E559 Vitamin D deficiency, unspecified: Secondary | ICD-10-CM | POA: Insufficient documentation

## 2014-04-12 LAB — POCT CBC
Granulocyte percent: 70.9 %G (ref 37–80)
HEMATOCRIT: 44.5 % (ref 37.7–47.9)
Hemoglobin: 13.9 g/dL (ref 12.2–16.2)
Lymph, poc: 1.9 (ref 0.6–3.4)
MCH: 26.7 pg — AB (ref 27–31.2)
MCHC: 31.3 g/dL — AB (ref 31.8–35.4)
MCV: 85.3 fL (ref 80–97)
MPV: 9.2 fL (ref 0–99.8)
POC Granulocyte: 5.3 (ref 2–6.9)
POC LYMPH PERCENT: 24.7 %L (ref 10–50)
Platelet Count, POC: 198 10*3/uL (ref 142–424)
RBC: 5.2 M/uL (ref 4.04–5.48)
RDW, POC: 14.3 %
WBC: 7.5 10*3/uL (ref 4.6–10.2)

## 2014-04-12 NOTE — Patient Instructions (Addendum)
Medicare Annual Wellness Visit  [facility] and the medical providers at [facility] strive to bring you the best medical care.  In doing so we not only want to address your current medical conditions and concerns but also to detect new conditions early and prevent illness, disease and health-related problems.    Medicare offers a yearly Wellness Visit which allows our clinical staff to assess your need for preventative services including immunizations, lifestyle education, counseling to decrease risk of preventable diseases and screening for fall risk and other medical concerns.    This visit is provided free of charge (no copay) for all Medicare recipients. The clinical pharmacists at [facility] have begun to conduct these Wellness Visits which will also include a thorough review of all your medications.    As you primary medical provider recommend that you make an appointment for your Annual Wellness Visit if you have not done so already this year.  You may set up this appointment before you leave today or you may call back ([phone]) and schedule an appointment.  Please make sure when you call that you mention that you are scheduling your Annual Wellness Visit with the clinical pharmacist so that the appointment may be made for the proper length of time.    Continue current medications. Continue good therapeutic lifestyle changes which include good diet and exercise. Fall precautions discussed with patient. If an FOBT was given today- please return it to our front desk. If you are over 35 years old - you may need Prevnar 22 or the adult Pneumonia vaccine.  Flu Shots will be available at our office starting mid- September. Please call and schedule a FLU CLINIC APPOINTMENT.      Stay active both physically and using your brain. Do not put yourself at risk for falling Remember to get your flu shot this fall Drink plenty of  fluids Continue medications as doing Try flonase one to 2 sprays each nostril daily at bedtime for allergic rhinitis

## 2014-04-12 NOTE — Progress Notes (Signed)
Subjective:    Patient ID: Cindy Duran, female    DOB: February 13, 1936, 78 y.o.   MRN: [id number]  HPI Pt here for follow up and management of chronic medical problems. The patient comes to the visit today with one of her family members, her son-in-law. Her only complaint is headaches and she says that tramadol 50 is not helping. If she takes 75 mg this works. She does not take it every day.       Patient Active Problem List   Diagnosis Date Noted  . Osteoporosis, senile 11/16/2013  . Osteoarthritis 10/12/2013  . TIA (transient ischemic attack) 04/16/2013  . Hyperlipidemia 04/16/2013  . HYPERTENSION, BENIGN 05/30/2009  . SHORTNESS OF BREATH 05/30/2009   Outpatient Encounter Prescriptions as of 04/12/2014  Medication Sig  . atorvastatin (LIPITOR) 20 MG tablet Take 1 tablet (20 mg total) by mouth daily.  . Cholecalciferol (VITAMIN D-3) 5000 UNITS TABS Take 2 capsules by mouth daily.  . clopidogrel (PLAVIX) 75 MG tablet Take 1 tablet (75 mg total) by mouth daily with breakfast.  . diazepam (VALIUM) 5 MG tablet Take 5 mg by mouth daily as needed for anxiety or muscle spasms.  Marland Kitchen levothyroxine (SYNTHROID) 200 MCG tablet Take 1 tablet (200 mcg total) by mouth daily before breakfast.  . meclizine (ANTIVERT) 12.5 MG tablet Take 1 tablet (12.5 mg total) by mouth 3 (three) times daily as needed for dizziness (take regularly for 1 wk then as needed).  . promethazine (PHENERGAN) 25 MG tablet TAKE 1 TABLET BY MOUTH ONCE A DAY AS NEEDED FOR NAUSEA  . traMADol (ULTRAM) 50 MG tablet Take 1 tablet (50 mg total) by mouth daily as needed.  . [DISCONTINUED] alendronate (FOSAMAX) 70 MG tablet Take 1 tablet (70 mg total) by mouth once a week. Take with a full glass of water on an empty stomach. Do not lie down or recline for 20 minutes after taking medication.    Review of Systems  Constitutional: Negative.   Eyes: Negative.   Respiratory: Negative.   Cardiovascular: Negative.   Gastrointestinal:  Negative.   Endocrine: Negative.   Genitourinary: Negative.   Musculoskeletal: Negative.   Skin: Negative.   Allergic/Immunologic: Negative.   Neurological: Positive for headaches.  Hematological: Negative.   Psychiatric/Behavioral: Negative.        Objective:   Physical Exam  Nursing note and vitals reviewed. Constitutional: She is oriented to person, place, and time. She appears well-developed and well-nourished. No distress.  HENT:  Head: Normocephalic and atraumatic.  Right Ear: External ear normal.  Left Ear: External ear normal.  Mouth/Throat: Oropharynx is clear and moist. No oropharyngeal exudate.  Nasal congestion bilateral  Eyes: Conjunctivae and EOM are normal. Pupils are equal, round, and reactive to light. Right eye exhibits no discharge. Left eye exhibits no discharge. No scleral icterus.  Neck: Normal range of motion. Neck supple. No JVD present. No thyromegaly present.  Cardiovascular: Normal rate, regular rhythm, normal heart sounds and intact distal pulses.   No murmur heard. At 84 per minute  Pulmonary/Chest: Effort normal and breath sounds normal. No respiratory distress. She has no wheezes. She has no rales. She exhibits no tenderness.  Abdominal: Soft. Bowel sounds are normal. She exhibits no mass. There is no tenderness. There is no rebound and no guarding.  Musculoskeletal: Normal range of motion. She exhibits no edema and no tenderness.  Range of motion is hesitant due to chronic arthritic pain  Lymphadenopathy:    She has no cervical adenopathy.  Neurological: She is alert and oriented to person, place, and time. She has normal reflexes. No cranial nerve deficit.  Skin: Skin is warm and dry. No rash noted.  Psychiatric: She has a normal mood and affect. Her behavior is normal. Judgment and thought content normal.   BP 130/62  Pulse 63  Temp(Src) 97.4 F (36.3 C) (Oral)  Ht 5' 5.75" (1.67 m)  Wt 177 lb (80.287 kg)  BMI 28.79 kg/m2          Assessment & Plan:  1. HYPERTENSION, BENIGN - POCT CBC - BMP8+EGFR - Hepatic function panel  2. Hyperlipidemia - POCT CBC - Lipid panel  3. Osteoarthritis of multiple joints, unspecified osteoarthritis type - POCT CBC - Vit D  25 hydroxy (rtn osteoporosis monitoring)  4. Vitamin D deficiency - Vit D  25 hydroxy (rtn osteoporosis monitoring)  5. Other migraine with status migrainosus, not intractable -Continue tramadol as doing  6. Allergic rhinitis  No orders of the defined types were placed in this encounter.   Patient Instructions                       Medicare Annual Wellness Visit  [facility] and the medical providers at [facility] strive to bring you the best medical care.  In doing so we not only want to address your current medical conditions and concerns but also to detect new conditions early and prevent illness, disease and health-related problems.    Medicare offers a yearly Wellness Visit which allows our clinical staff to assess your need for preventative services including immunizations, lifestyle education, counseling to decrease risk of preventable diseases and screening for fall risk and other medical concerns.    This visit is provided free of charge (no copay) for all Medicare recipients. The clinical pharmacists at [facility] have begun to conduct these Wellness Visits which will also include a thorough review of all your medications.    As you primary medical provider recommend that you make an appointment for your Annual Wellness Visit if you have not done so already this year.  You may set up this appointment before you leave today or you may call back ([phone]) and schedule an appointment.  Please make sure when you call that you mention that you are scheduling your Annual Wellness Visit with the clinical pharmacist so that the appointment may be made for the proper length of time.    Continue current  medications. Continue good therapeutic lifestyle changes which include good diet and exercise. Fall precautions discussed with patient. If an FOBT was given today- please return it to our front desk. If you are over 81 years old - you may need Prevnar 80 or the adult Pneumonia vaccine.  Flu Shots will be available at our office starting mid- September. Please call and schedule a FLU CLINIC APPOINTMENT.      Stay active both physically and using your brain. Do not put yourself at risk for falling Remember to get your flu shot this fall Drink plenty of fluids Continue medications as doing Try flonase one to 2 sprays each nostril daily at bedtime for allergic rhinitis   Arrie Senate MD

## 2014-04-13 LAB — BMP8+EGFR
BUN / CREAT RATIO: 21 (ref 11–26)
BUN: 14 mg/dL (ref 8–27)
CHLORIDE: 100 mmol/L (ref 97–108)
CO2: 23 mmol/L (ref 18–29)
Calcium: 9.8 mg/dL (ref 8.7–10.3)
Creatinine, Ser: 0.67 mg/dL (ref 0.57–1.00)
GFR calc non Af Amer: 86 mL/min/{1.73_m2} (ref >59)
GFR, EST AFRICAN AMERICAN: 99 mL/min/{1.73_m2} (ref >59)
Glucose: 95 mg/dL (ref 65–99)
Potassium: 4.9 mmol/L (ref 3.5–5.2)
SODIUM: 143 mmol/L (ref 134–144)

## 2014-04-13 LAB — HEPATIC FUNCTION PANEL
ALBUMIN: 4.1 g/dL (ref 3.5–4.8)
ALK PHOS: 119 IU/L — AB (ref 39–117)
ALT: 14 IU/L (ref 0–32)
AST: 23 IU/L (ref 0–40)
Bilirubin, Direct: 0.1 mg/dL (ref 0.00–0.40)
Total Bilirubin: 0.3 mg/dL (ref 0.0–1.2)
Total Protein: 6.4 g/dL (ref 6.0–8.5)

## 2014-04-13 LAB — LIPID PANEL
CHOL/HDL RATIO: 2.6 ratio (ref 0.0–4.4)
Cholesterol, Total: 153 mg/dL (ref 100–199)
HDL: 60 mg/dL (ref >39)
LDL Calculated: 77 mg/dL (ref 0–99)
TRIGLYCERIDES: 78 mg/dL (ref 0–149)
VLDL Cholesterol Cal: 16 mg/dL (ref 5–40)

## 2014-04-13 LAB — VITAMIN D 25 HYDROXY (VIT D DEFICIENCY, FRACTURES): Vit D, 25-Hydroxy: 69.1 ng/mL (ref 30.0–100.0)

## 2014-05-17 ENCOUNTER — Ambulatory Visit (INDEPENDENT_AMBULATORY_CARE_PROVIDER_SITE_OTHER): Payer: Medicare Other

## 2014-05-17 DIAGNOSIS — Z23 Encounter for immunization: Secondary | ICD-10-CM

## 2014-05-22 ENCOUNTER — Other Ambulatory Visit: Payer: Self-pay | Admitting: Family Medicine

## 2014-05-22 MED ORDER — TRAMADOL HCL 50 MG PO TABS: 50.0000 mg | ORAL_TABLET | Freq: Every day | ORAL | Status: DC | PRN

## 2014-05-22 NOTE — Telephone Encounter (Signed)
This is okay x1 

## 2014-05-22 NOTE — Telephone Encounter (Signed)
Because this is a controlled drug we can only do a one month supply at a time, please let the patient know this.

## 2014-05-22 NOTE — Telephone Encounter (Signed)
Explained to patient.  Will you ok script for 30 day supply? Last filled for #90 on 02/15/14.

## 2014-05-23 NOTE — Telephone Encounter (Signed)
Patient aware.

## 2014-06-15 ENCOUNTER — Observation Stay (HOSPITAL_COMMUNITY): Payer: Medicare Other

## 2014-06-15 ENCOUNTER — Emergency Department (HOSPITAL_COMMUNITY): Payer: Medicare Other

## 2014-06-15 ENCOUNTER — Observation Stay (HOSPITAL_COMMUNITY)
Admission: EM | Admit: 2014-06-15 | Discharge: 2014-06-16 | Disposition: A | Discharge status: Home / Self Care | Payer: Medicare Other | Attending: Internal Medicine | Admitting: Internal Medicine

## 2014-06-15 ENCOUNTER — Telehealth: Payer: Self-pay | Admitting: *Deleted

## 2014-06-15 ENCOUNTER — Encounter (HOSPITAL_COMMUNITY): Payer: Self-pay | Admitting: Emergency Medicine

## 2014-06-15 DIAGNOSIS — I10 Essential (primary) hypertension: Secondary | ICD-10-CM | POA: Insufficient documentation

## 2014-06-15 DIAGNOSIS — Z885 Allergy status to narcotic agent status: Secondary | ICD-10-CM | POA: Diagnosis not present

## 2014-06-15 DIAGNOSIS — Z880 Allergy status to penicillin: Secondary | ICD-10-CM | POA: Insufficient documentation

## 2014-06-15 DIAGNOSIS — M1990 Unspecified osteoarthritis, unspecified site: Secondary | ICD-10-CM | POA: Insufficient documentation

## 2014-06-15 DIAGNOSIS — Z823 Family history of stroke: Secondary | ICD-10-CM | POA: Diagnosis not present

## 2014-06-15 DIAGNOSIS — Z883 Allergy status to other anti-infective agents status: Secondary | ICD-10-CM | POA: Diagnosis not present

## 2014-06-15 DIAGNOSIS — G4489 Other headache syndrome: Secondary | ICD-10-CM

## 2014-06-15 DIAGNOSIS — E039 Hypothyroidism, unspecified: Secondary | ICD-10-CM | POA: Insufficient documentation

## 2014-06-15 DIAGNOSIS — E785 Hyperlipidemia, unspecified: Secondary | ICD-10-CM | POA: Diagnosis not present

## 2014-06-15 DIAGNOSIS — Z91040 Latex allergy status: Secondary | ICD-10-CM | POA: Insufficient documentation

## 2014-06-15 DIAGNOSIS — Z888 Allergy status to other drugs, medicaments and biological substances status: Secondary | ICD-10-CM | POA: Diagnosis not present

## 2014-06-15 DIAGNOSIS — G459 Transient cerebral ischemic attack, unspecified: Secondary | ICD-10-CM | POA: Diagnosis not present

## 2014-06-15 DIAGNOSIS — G458 Other transient cerebral ischemic attacks and related syndromes: Secondary | ICD-10-CM

## 2014-06-15 DIAGNOSIS — G43909 Migraine, unspecified, not intractable, without status migrainosus: Secondary | ICD-10-CM | POA: Insufficient documentation

## 2014-06-15 LAB — COMPREHENSIVE METABOLIC PANEL
ALT: 12 U/L (ref 0–35)
AST: 20 U/L (ref 0–37)
Albumin: 3.4 g/dL — ABNORMAL LOW (ref 3.5–5.2)
Alkaline Phosphatase: 93 U/L (ref 39–117)
Anion gap: 12 (ref 5–15)
BUN: 14 mg/dL (ref 6–23)
CALCIUM: 9.2 mg/dL (ref 8.4–10.5)
CO2: 26 mEq/L (ref 19–32)
CREATININE: 0.75 mg/dL (ref 0.50–1.10)
Chloride: 104 mEq/L (ref 96–112)
GFR, EST NON AFRICAN AMERICAN: 80 mL/min — AB (ref >90)
GLUCOSE: 89 mg/dL (ref 70–99)
Potassium: 4.1 mEq/L (ref 3.7–5.3)
Sodium: 142 mEq/L (ref 137–147)
Total Bilirubin: 0.4 mg/dL (ref 0.3–1.2)
Total Protein: 6.4 g/dL (ref 6.0–8.3)

## 2014-06-15 LAB — DIFFERENTIAL
BASOS ABS: 0 10*3/uL (ref 0.0–0.1)
Basophils Relative: 0 % (ref 0–1)
EOS PCT: 2 % (ref 0–5)
Eosinophils Absolute: 0.1 10*3/uL (ref 0.0–0.7)
Lymphocytes Relative: 26 % (ref 12–46)
Lymphs Abs: 1.7 10*3/uL (ref 0.7–4.0)
MONO ABS: 0.6 10*3/uL (ref 0.1–1.0)
Monocytes Relative: 9 % (ref 3–12)
Neutro Abs: 4.3 10*3/uL (ref 1.7–7.7)
Neutrophils Relative %: 63 % (ref 43–77)

## 2014-06-15 LAB — I-STAT TROPONIN, ED: TROPONIN I, POC: 0.01 ng/mL (ref 0.00–0.08)

## 2014-06-15 LAB — CBC
HEMATOCRIT: 39.3 % (ref 36.0–46.0)
Hemoglobin: 12.9 g/dL (ref 12.0–15.0)
MCH: 28 pg (ref 26.0–34.0)
MCHC: 32.8 g/dL (ref 30.0–36.0)
MCV: 85.2 fL (ref 78.0–100.0)
PLATELETS: 189 10*3/uL (ref 150–400)
RBC: 4.61 MIL/uL (ref 3.87–5.11)
RDW: 13.5 % (ref 11.5–15.5)
WBC: 6.7 10*3/uL (ref 4.0–10.5)

## 2014-06-15 LAB — PROTIME-INR
INR: 1.07 (ref 0.00–1.49)
Prothrombin Time: 14 seconds (ref 11.6–15.2)

## 2014-06-15 LAB — APTT: aPTT: 35 seconds (ref 24–37)

## 2014-06-15 LAB — CBG MONITORING, ED: GLUCOSE-CAPILLARY: 82 mg/dL (ref 70–99)

## 2014-06-15 MED ORDER — ASPIRIN-ACETAMINOPHEN-CAFFEINE 250-250-65 MG PO TABS
2.0000 | ORAL_TABLET | Freq: Two times a day (BID) | ORAL | Status: DC | PRN
  Filled 2014-06-15: qty 2

## 2014-06-15 MED ORDER — METOCLOPRAMIDE HCL 5 MG/ML IJ SOLN
10.0000 mg | Freq: Once | INTRAMUSCULAR | Status: AC
  Administered 2014-06-15: 10 mg via INTRAVENOUS
  Filled 2014-06-15: qty 2

## 2014-06-15 MED ORDER — ACETAMINOPHEN 325 MG PO TABS
325.0000 mg | ORAL_TABLET | Freq: Two times a day (BID) | ORAL | Status: DC
  Administered 2014-06-16: 325 mg via ORAL
  Filled 2014-06-15: qty 1

## 2014-06-15 MED ORDER — OXYCODONE-ACETAMINOPHEN 5-325 MG PO TABS
2.0000 | ORAL_TABLET | Freq: Once | ORAL | Status: AC
  Administered 2014-06-15: 2 via ORAL
  Filled 2014-06-15: qty 2

## 2014-06-15 MED ORDER — ONDANSETRON HCL 4 MG/2ML IJ SOLN: 4.0000 mg | Freq: Three times a day (TID) | INTRAMUSCULAR | Status: AC | PRN

## 2014-06-15 MED ORDER — LEVOTHYROXINE SODIUM 100 MCG PO TABS
200.0000 ug | ORAL_TABLET | Freq: Every day | ORAL | Status: DC
  Administered 2014-06-16: 200 ug via ORAL
  Filled 2014-06-15: qty 2

## 2014-06-15 MED ORDER — ATORVASTATIN CALCIUM 10 MG PO TABS
20.0000 mg | ORAL_TABLET | Freq: Every day | ORAL | Status: DC
  Administered 2014-06-15: 20 mg via ORAL
  Filled 2014-06-15: qty 2

## 2014-06-15 MED ORDER — STROKE: EARLY STAGES OF RECOVERY BOOK
Freq: Once | Status: AC
  Administered 2014-06-15: 20:00:00
  Filled 2014-06-15: qty 1

## 2014-06-15 MED ORDER — ASPIRIN 325 MG PO TABS
325.0000 mg | ORAL_TABLET | Freq: Two times a day (BID) | ORAL | Status: DC
  Administered 2014-06-15 – 2014-06-16 (×3): 325 mg via ORAL
  Filled 2014-06-15 (×3): qty 1

## 2014-06-15 MED ORDER — HEPARIN SODIUM (PORCINE) 5000 UNIT/ML IJ SOLN
5000.0000 [IU] | Freq: Three times a day (TID) | INTRAMUSCULAR | Status: DC
  Administered 2014-06-15 – 2014-06-16 (×2): 5000 [IU] via SUBCUTANEOUS
  Filled 2014-06-15 (×2): qty 1

## 2014-06-15 MED ORDER — DIAZEPAM 5 MG PO TABS: 5.0000 mg | ORAL_TABLET | Freq: Every day | ORAL | Status: DC | PRN

## 2014-06-15 MED ORDER — DIPHENHYDRAMINE HCL 50 MG/ML IJ SOLN
25.0000 mg | Freq: Once | INTRAMUSCULAR | Status: AC
  Administered 2014-06-15: 25 mg via INTRAVENOUS
  Filled 2014-06-15: qty 1

## 2014-06-15 MED ORDER — CLOPIDOGREL BISULFATE 75 MG PO TABS
75.0000 mg | ORAL_TABLET | Freq: Every day | ORAL | Status: DC
  Administered 2014-06-16: 75 mg via ORAL
  Filled 2014-06-15: qty 1

## 2014-06-15 MED ORDER — ACETAMINOPHEN 325 MG PO TABS
650.0000 mg | ORAL_TABLET | ORAL | Status: DC | PRN
  Administered 2014-06-15 – 2014-06-16 (×2): 650 mg via ORAL
  Filled 2014-06-15 (×2): qty 2

## 2014-06-15 MED ORDER — TRAMADOL HCL 50 MG PO TABS
75.0000 mg | ORAL_TABLET | Freq: Two times a day (BID) | ORAL | Status: DC | PRN
  Administered 2014-06-15 – 2014-06-16 (×2): 75 mg via ORAL
  Filled 2014-06-15 (×3): qty 2

## 2014-06-15 MED ORDER — PROMETHAZINE HCL 25 MG PO TABS: 25.0000 mg | ORAL_TABLET | Freq: Two times a day (BID) | ORAL | Status: DC | PRN

## 2014-06-15 NOTE — ED Notes (Signed)
CBG 82 

## 2014-06-15 NOTE — Consult Note (Signed)
Consult Reason for Consult: TIA, r/o CVA  Referring Physician: Dr. Lanae Crumbly D. Sabra Heck   CC: Left hand/arm/face numbness and tingling  HPI: Cindy Duran is an 79 y.o. female with past medical history of TIA in August 2014, hypertension (previously on anti-hypertensive therapy), hyperlipidemia, migraine headaches without aura, hypothyroidism on replacement, and osteoarthritis who presents with chief complaint of left hand/arm and face numbness/tingling.   She reports feeling in her normal state of health until 10-10:30 AM today when she began having left thumb numbness that extended to her left pinky finger, left forearm, and then to the lower part of the left side of her face which lasted for approximately 15-20 minutes. At the time her daughter reports she was slow in response to questions (however was able to answer them correctly) and was not able to recall family member's birthday or her phone number. She was also having a left sided temporal to frontal located headache that is similar in location to her normal migraine headaches but greater in intensity without nausea/vomiting or photophobia. She continues to have the headache with resolution of her other symptoms. She denies arm/leg weakness, facial droop, diplopia, blurry vision, dysarthria, dizziness, ataxia, neck pain, chest pain, palpitations, dyspnea, syncope,  fever, chills, recent illness, fall, head trauma, or bleeding. She reports she did not have anything to eat today.  She reports this episode is similar but not as severe to a TIA she had August of 2014 where she presented with left hand/face numbness, facial droop, and slurrred speech. 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 2D-echo and [place] of carotids were unremarkable. She denies history of atrial fibrillation but reports occasionally her heart skips a beat. She had cardiac stress testing in 2010  that was unremarkable. She was started on plavix 75 mg daily at that time which she reports compliance with. She was seen in follow-up by NP Charlott Holler on 06/21/13 and was to follow-up in 3 months but did not. She had near-syncope during that visit and found to have orthostatic hypotension. Her anti-hypertensive medications which she reports she was taking for 35 years were held. She reports normotension on recent PCP visits and is not currently on anti-hypertensive therapy (for the past year). She reports no neurological symptoms since her TIA in August.  She has family history of stroke in her mother and father. She denies history of diabetes and is on atorvastatin 20 mg daily for hyperlipidemia. Her last lipid panel in August revealed LDL of 77. She denies history of tobacco, alcohol, or dug abuse.  On admission to the ED she continued to have left sided headache without relief with migraine cocktail (reglan and benadryl). Her labwork was unremarkable. CT head revealed no acute abnormality. 12-lead EKG was normal. Her blood pressure was elevated at 176/79 and as high as 206/87.   Last known well: 10-10:30 AM today  tPA not given due to resolution of symptoms   Past Medical History  Diagnosis Date  . Hypertension   . Migraine   . Thyroid disease   . High cholesterol     Past Surgical History  Procedure Laterality Date  . Abdominal hysterectomy    . Appendectomy    . Shoulder surgery    . Rotator cuff repair      Family History  Problem Relation Age of Onset  . Stroke Mother   . Alzheimer's disease Mother   . Osteoporosis Mother  broken hip  . Stroke Father   . Heart attack Brother   . Heart attack Maternal Aunt   . Stroke Maternal Aunt     Social History:  reports that she has never smoked. She has never used smokeless tobacco. She reports that she does not drink alcohol or use illicit drugs.  Allergies  Allergen Reactions  . Fioricet [Butalbital-Apap-Caffeine] Other (See  Comments)    hallucinations  . Iodine Other (See Comments)    "skin comes off"  . Latex Other (See Comments)    Skin peeling off  . Meloxicam Other (See Comments)    Pt can't remember rxn  . Meperidine Hcl Nausea And Vomiting  . Morphine Nausea And Vomiting  . Other     Clorox Bleach  . Penicillins Hives  . Rosuvastatin Other (See Comments)    Pt can't remember rxn  . Simvastatin Other (See Comments)    Pt can't remember rxn  . Sulfonamide Derivatives Swelling    Medications: I have reviewed the patient's current medications.  ROS: Out of a complete 14 system review, the patient complains of only the following symptoms, and all other reviewed systems are negative.  Physical Examination: Blood pressure 176/79, pulse 65, temperature 98.9 F (37.2 C), temperature source Oral, resp. rate 18, SpO2 98.00%.  Neurologic Examination  Mental Status:  Alert, oriented, thought content appropriate. Speech fluent without evidence of aphasia.  Able to follow 3 step commands without difficulty.  Cranial Nerves:  II: visual fields grossly normal, pupils equal, round, reactive to light and accommodation  III,IV, VI: ptosis not present, extra-ocular motions intact bilaterally  V,VII: no facial droop, facial light touch sensation mildly reduced on left  VIII: hearing normal bilaterally  IX,X: gag reflex present  XI: trapezius strength/neck flexion strength normal bilaterally  XII: no deviation of tongue   Motor:  Right : Upper extremity 5/5 throughout Left: Upper extremity Proximal 5/5, distal 5/5  Right Lower extremity Left Lower extremity 5/5 strength on right 5/5 proximal to 5/5 distally  Tone and bulk:normal tone throughout; no atrophy noted  Sensory: Pinprick and light touch mildly reduced on left side of face, arm, and leg  Deep Tendon Reflexes: 2+ and symmetric throughout  Plantars: Right: downgoing Left: downgoing  Cerebellar: Normal FTN bilaterally   Gait: Deferred due to fall  risk    Laboratory Studies:   Basic Metabolic Panel:  Recent Labs Lab 06/15/14 1315  NA 142  K 4.1  CL 104  CO2 26  GLUCOSE 89  BUN 14  CREATININE 0.75  CALCIUM 9.2    Liver Function Tests:  Recent Labs Lab 06/15/14 1315  AST 20  ALT 12  ALKPHOS 93  BILITOT 0.4  PROT 6.4  ALBUMIN 3.4*   No results found for this basename: LIPASE, AMYLASE,  in the last 168 hours No results found for this basename: AMMONIA,  in the last 168 hours  CBC:  Recent Labs Lab 06/15/14 1315  WBC 6.7  NEUTROABS 4.3  HGB 12.9  HCT 39.3  MCV 85.2  PLT 189    Cardiac Enzymes: No results found for this basename: CKTOTAL, CKMB, CKMBINDEX, TROPONINI,  in the last 168 hours  BNP: No components found with this basename: POCBNP,   CBG:  Recent Labs Lab 06/15/14 1311  GLUCAP 82    Microbiology: No results found for this or any previous visit.  Coagulation Studies:  Recent Labs  06/15/14 1315  LABPROT 14.0  INR 1.07    Urinalysis: No  results found for this basename: COLORURINE, APPERANCEUR, LABSPEC, PHURINE, GLUCOSEU, HGBUR, BILIRUBINUR, KETONESUR, PROTEINUR, UROBILINOGEN, NITRITE, LEUKOCYTESUR,  in the last 168 hours  Lipid Panel:     Component Value Date/Time   CHOL 149 10/20/2013 1338   TRIG 78 04/12/2014 1240   TRIG 80 10/20/2013 1338   HDL 60 04/12/2014 1240   HDL 47 10/20/2013 1338   HDL 42 04/15/2013 0650   CHOLHDL 2.6 04/12/2014 1240   CHOLHDL 3.9 04/15/2013 0650   VLDL 18 04/15/2013 0650   LDLCALC 77 04/12/2014 1240   LDLCALC 86 10/20/2013 1338   LDLCALC 102* 04/15/2013 0650    HgbA1C:  Lab Results  Component Value Date   HGBA1C 5.5 04/14/2013    Urine Drug Screen:     Component Value Date/Time   LABOPIA NONE DETECTED 04/15/2013 1540   COCAINSCRNUR NONE DETECTED 04/15/2013 1540   LABBENZ NONE DETECTED 04/15/2013 1540   AMPHETMU NONE DETECTED 04/15/2013 1540   THCU NONE DETECTED 04/15/2013 1540   LABBARB NONE DETECTED 04/15/2013 1540    Alcohol Level: No results  found for this basename: ETH,  in the last 168 hours  Other results: EKG: normal EKG, normal sinus rhythm, unchanged from previous tracings, normal sinus rhythm.  Imaging: No results found.   Assessment: Pt is a 78 y.o. female with past medical history of TIA in August 2014, hypertension (previously on anti-hypertensive therapy), hyperlipidemia, and migraine headaches without aura who presents with transient left hand/arm/face paraesthesias. Concern for TIA vs potentially related to hypertensive urgency. Pt currently with no residual symptoms (very mild left sensory deficit on left side of face/arm/leg).     Plan:  1. MRI Head MRA Brain  2. HgbA1c, fasting lipid panel,TSH  3. Frequent neuro checks  4. Echocardiogram  5. Carotid dopplers  6. Prophylactic therapy- Plavix 75 mg daily (started after last TIA 03/2013) 7. Risk factor modification  8. Telemetry monitoring  9. PT consult, OT consult, Speech consult  Juluis Mire, MD PGY-II IMTS Pager: [phone]   Attending note to follow  Patient seen and evaluated. Agree with above plan. In brief, she is a 78y/o woman with prior TIA p/w transient episode of left sided paresthesias and confusion. Currently at baseline. Admit for TIA workup.   Jim Like, DO [facility] [phone]  If 7pm- 7am, please page neurology on call as listed in [place].   06/15/2014, 2:51 PM

## 2014-06-15 NOTE — Telephone Encounter (Signed)
She needed to call 911 and get transported to ER  for evaluation. Patient stated that granddaughter was a EMT and was going to take [facility] straight to [facility]. Patients speech was clear and easy to understand while speaking with patient.

## 2014-06-15 NOTE — Telephone Encounter (Signed)
Patient called in stating that she started having left hand numbness that radiated up her arm and then the left side of her face started going numb. She stated that since having this episode that her memory is not clear since this happened and this is what it felt like when she had her last TIA. Patient was advised that

## 2014-06-15 NOTE — Progress Notes (Addendum)
Patient admitted from the ED for TIA work up. Symptoms have resolved and has NIH of 0. Alert and oriented. Placed on Cardiac Monitor and call bell placed within reach.

## 2014-06-15 NOTE — ED Notes (Signed)
Dr. Sabra Heck informed of patient HA. No further meds at this time.

## 2014-06-15 NOTE — ED Notes (Signed)
Pt c/o waking with HA at 0300 today; pt sts hx of migraine but feels different; pt has had some left sided finger numbness and around mouth that is now resolved; pt with hx of TIA in past; no obvious neuro deficits noted

## 2014-06-15 NOTE — H&P (Signed)
[facility] History and Physical  Cindy Duran DTO:[id number] DOB: Feb 25, 1936 DOA: 06/15/2014  Referring physician: Dr. Sabra Heck PCP: Cindy Gainer, MD   Chief Complaint: left hand numbness  HPI: Cindy Duran is a 78 y.o. female with past medical history of TIA in August 2014, family history of stroke, hypertension (previously on anti-hypertensive therapy), hyperlipidemia, migraine headaches without aura, hypothyroidism on replacement, and osteoarthritis who presents with chief complaint of left hand/arm and face numbness/tingling.  She relates that this a.m. she started having left thumb numbness that extended into her forearm lasted about 15 minutes. As per daughter she was slow to response during this time. She relates she was having headache during this time. But they did not see any slurred speech or ataxia.  In the ED: Symptoms resolved so no TPA was given until to further evaluation. Review of Systems:  Constitutional:  No weight loss, night sweats, Fevers, chills, fatigue.  HEENT:  No headaches, Difficulty swallowing,Tooth/dental problems,Sore throat,  No sneezing, itching, ear ache, nasal congestion, post nasal drip,  Cardio-vascular:  No chest pain, Orthopnea, PND, swelling in lower extremities, anasarca, dizziness, palpitations  GI:  No heartburn, indigestion, abdominal pain, nausea, vomiting, diarrhea, change in bowel habits, loss of appetite  Resp:  No shortness of breath with exertion or at rest. No excess mucus, no productive cough, No non-productive cough, No coughing up of blood.No change in color of mucus.No wheezing.No chest wall deformity  Skin:  no rash or lesions.  GU:  no dysuria, change in color of urine, no urgency or frequency. No flank pain.  Musculoskeletal:  No joint pain or swelling. No decreased range of motion. No back pain.  Psych:  No change in mood or affect. No depression or anxiety. No memory loss.   Past Medical History  Diagnosis Date    . Hypertension   . Migraine   . Thyroid disease   . High cholesterol    Past Surgical History  Procedure Laterality Date  . Abdominal hysterectomy    . Appendectomy    . Shoulder surgery    . Rotator cuff repair     Social History:  reports that she has never smoked. She has never used smokeless tobacco. She reports that she does not drink alcohol or use illicit drugs.  Allergies  Allergen Reactions  . Fioricet [Butalbital-Apap-Caffeine] Other (See Comments)    hallucinations  . Iodine Other (See Comments)    "skin comes off"  . Latex Other (See Comments)    Skin peeling off  . Meloxicam Other (See Comments)    Pt can't remember rxn  . Meperidine Hcl Nausea And Vomiting  . Morphine Nausea And Vomiting  . Other     Clorox Bleach  . Penicillins Hives  . Rosuvastatin Other (See Comments)    Pt can't remember rxn  . Simvastatin Other (See Comments)    Pt can't remember rxn  . Sulfonamide Derivatives Swelling    Family History  Problem Relation Age of Onset  . Stroke Mother   . Alzheimer's disease Mother   . Osteoporosis Mother     broken hip  . Stroke Father   . Heart attack Brother   . Heart attack Maternal Aunt   . Stroke Maternal Aunt      Prior to Admission medications   Medication Sig Start Date End Date Taking? Authorizing Provider  aspirin-acetaminophen-caffeine (EXCEDRIN MIGRAINE) [phone] MG per tablet Take 2 tablets by mouth 2 (two) times daily as needed for headache or  migraine.   Yes Historical Provider, MD  atorvastatin (LIPITOR) 20 MG tablet Take 1 tablet (20 mg total) by mouth daily. 10/12/13  Yes Chipper Herb, MD  Cholecalciferol (VITAMIN D-3) 5000 UNITS TABS Take 2 capsules by mouth daily.   Yes Historical Provider, MD  clopidogrel (PLAVIX) 75 MG tablet Take 1 tablet (75 mg total) by mouth daily with breakfast. 10/12/13  Yes Chipper Herb, MD  diazepam (VALIUM) 5 MG tablet Take 5 mg by mouth daily as needed for anxiety or muscle spasms. 10/12/13   Yes Chipper Herb, MD  levothyroxine (SYNTHROID) 200 MCG tablet Take 1 tablet (200 mcg total) by mouth daily before breakfast. 10/12/13  Yes Chipper Herb, MD  promethazine (PHENERGAN) 25 MG tablet Take 25 mg by mouth every 12 (twelve) hours as needed for nausea or vomiting.   Yes Historical Provider, MD  traMADol (ULTRAM) 50 MG tablet Take 75 mg by mouth 2 (two) times daily as needed (for pain).   Yes Historical Provider, MD   Physical Exam: Filed Vitals:   06/15/14 1400 06/15/14 1445 06/15/14 1530 06/15/14 1615  BP: 180/84 181/67 163/60 144/66  Pulse: 67 75 66 66  Temp:      TempSrc:      Resp:      SpO2: 99% 98% 98% 97%    Wt Readings from Last 3 Encounters:  04/12/14 80.287 kg (177 lb)  11/16/13 78.019 kg (172 lb)  10/12/13 76.658 kg (169 lb)    General:  Appears calm and comfortable Eyes: PERRL, normal lids, irises & conjunctiva ENT: grossly normal hearing, lips & tongue Neck: no LAD, masses or thyromegaly Cardiovascular: RRR, no m/r/g. No LE edema. Telemetry: SR, no arrhythmias  Respiratory: CTA bilaterally, no w/r/r. Normal respiratory effort. Abdomen: soft, ntnd Skin: no rash or induration seen on limited exam Musculoskeletal: grossly normal tone BUE/BLE Psychiatric: grossly normal mood and affect, speech fluent and appropriate Neurologic: Neurologic Examination  Mental Status:  Alert, oriented, thought content appropriate. Speech fluent without evidence of aphasia. Able to follow 3 step commands without difficulty.  Cranial Nerves:  II: visual fields grossly normal, pupils equal, round, reactive to light and accommodation  III,IV, VI: ptosis not present, extra-ocular motions intact bilaterally  V,VII: no facial droop, facial light touch sensation mildly reduced on left  VIII: hearing normal bilaterally  IX,X: gag reflex present  XI: trapezius strength/neck flexion strength normal bilaterally  XII: no deviation of tongue  Motor:  Right : Upper extremity 5/5  throughout Left: Upper extremity Proximal 5/5, distal 5/5  Right Lower extremity Left Lower extremity 5/5 strength on right 5/5 proximal to 5/5 distally  Tone and bulk:normal tone throughout; no atrophy noted  Sensory: Pinprick and light touch mildly reduced on left side of face, arm, and leg  Deep Tendon Reflexes: 2+ and symmetric throughout  Plantars: Right: downgoing Left: downgoing  Cerebellar: Normal FTN bilaterally  Gait: Deferred due to fall risk           Labs on Admission:  Basic Metabolic Panel:  Recent Labs Lab 06/15/14 1315  NA 142  K 4.1  CL 104  CO2 26  GLUCOSE 89  BUN 14  CREATININE 0.75  CALCIUM 9.2   Liver Function Tests:  Recent Labs Lab 06/15/14 1315  AST 20  ALT 12  ALKPHOS 93  BILITOT 0.4  PROT 6.4  ALBUMIN 3.4*   No results found for this basename: LIPASE, AMYLASE,  in the last 168 hours No results found for  this basename: AMMONIA,  in the last 168 hours CBC:  Recent Labs Lab 06/15/14 1315  WBC 6.7  NEUTROABS 4.3  HGB 12.9  HCT 39.3  MCV 85.2  PLT 189   Cardiac Enzymes: No results found for this basename: CKTOTAL, CKMB, CKMBINDEX, TROPONINI,  in the last 168 hours  BNP (last 3 results) No results found for this basename: PROBNP,  in the last 8760 hours CBG:  Recent Labs Lab 06/15/14 1311  GLUCAP 82    Radiological Exams on Admission: Ct Head (brain) Wo Contrast  06/15/2014   CLINICAL DATA:  Headache at 3 o'clock today.  History migraines.  EXAM: CT HEAD WITHOUT CONTRAST  TECHNIQUE: Contiguous axial images were obtained from the base of the skull through the vertex without intravenous contrast.  COMPARISON:  04/14/2013  FINDINGS: There is no evidence of mass effect, midline shift, or extra-axial fluid collections. There is no evidence of a space-occupying lesion or intracranial hemorrhage. There is no evidence of a cortical-based area of acute infarction. There is generalized cerebral atrophy. There is periventricular white  matter low attenuation likely secondary to microangiopathy.  The ventricles and sulci are appropriate for the patient's age. The basal cisterns are patent.  Visualized portions of the orbits are unremarkable. The visualized portions of the paranasal sinuses and mastoid air cells are unremarkable.  The osseous structures are unremarkable.  IMPRESSION: No acute intracranial pathology.   Electronically Signed   By: Kathreen Devoid   On: 06/15/2014 15:28    EKG: Independently reviewed. Normal sinus rhythm Y axis  Assessment/Plan TIA (transient ischemic attack) - HgbA1c, fasting lipid panel  MRI, MRA of the brain without contrast  PT consult, OT consult, Speech consult  Echocardiogram  Carotid dopplers  Prophylactic therapy-Antiplatelet med: continue Plavix , might need aspirin will defer to neurology. Avoid D5 fluids as may be harmfull risk factor modification  Cardiac Monitoring  Neurochecks q4h  Keep MAP 70 tobacco counseling cessation.   HYPERTENSION, BENIGN - She is on no blood pressure medication at home a low permissive hypertension.  Code Status: full (must indicate code status--if unknown or must be presumed, indicate so) DVT Prophylaxis: Family Communication: Daughter's Disposition Plan: observation (indicate anticipated LOS)  Time spent: 48 minutes  FELIZ Marguarite Arbour [facility] Pager [phone]

## 2014-06-15 NOTE — ED Provider Notes (Addendum)
CSN: [id number]     Arrival date & time 06/15/14  1201 History   First MD Initiated Contact with Patient 06/15/14 1253     Chief Complaint  Patient presents with  . Numbness  . Headache     (Consider location/radiation/quality/duration/timing/severity/associated sxs/prior Treatment) HPI Comments: 78 year old female, history of transient ischemic attack in August 2014, hypothyroidism and chronic headaches, hypercholesterolemia. She presents with acute onset of numbness of her left upper extremity and her face which occurred at approximately 10:30 this morning. The symptoms were persistent, gradually improved and have now completely resolved. She does note that she has no associated weakness, difficulty with vision or balance but did have some difficulty with memory, she was unable to remember her phone number or family members birthdays of which she knows them very well. At this time the symptoms have resolved as well. She does state that she has chronic headaches, the headache that she has today is not unusual though it is intense and unremitting despite taking her over-the-counter medications. She has no fever chills nausea vomiting stiff neck or any other complaints. Her workup in August involved MRI of the brain, MRA, carotid ultrasounds and echocardiogram, none of which revealed any specific abnormalities.  Patient is a 78 y.o. female presenting with headaches. The history is provided by the patient, a relative and medical records.  Headache   Past Medical History  Diagnosis Date  . Hypertension   . Migraine   . Thyroid disease   . High cholesterol    Past Surgical History  Procedure Laterality Date  . Abdominal hysterectomy    . Appendectomy    . Shoulder surgery    . Rotator cuff repair     Family History  Problem Relation Age of Onset  . Stroke Mother   . Alzheimer's disease Mother   . Osteoporosis Mother     broken hip  . Stroke Father   . Heart attack Brother   .  Heart attack Maternal Aunt   . Stroke Maternal Aunt    History  Substance Use Topics  . Smoking status: Never Smoker   . Smokeless tobacco: Never Used  . Alcohol Use: No   OB History   Grav Para Term Preterm Abortions TAB SAB Ect Mult Living                 Review of Systems  Neurological: Positive for headaches.  All other systems reviewed and are negative.     Allergies  Fioricet; Iodine; Latex; Meloxicam; Meperidine hcl; Morphine; Other; Penicillins; Rosuvastatin; Simvastatin; and Sulfonamide derivatives  Home Medications   Prior to Admission medications   Medication Sig Start Date End Date Taking? Authorizing Provider  aspirin-acetaminophen-caffeine (EXCEDRIN MIGRAINE) [phone] MG per tablet Take 2 tablets by mouth 2 (two) times daily as needed for headache or migraine.   Yes Historical Provider, MD  atorvastatin (LIPITOR) 20 MG tablet Take 1 tablet (20 mg total) by mouth daily. 10/12/13  Yes Chipper Herb, MD  Cholecalciferol (VITAMIN D-3) 5000 UNITS TABS Take 2 capsules by mouth daily.   Yes Historical Provider, MD  clopidogrel (PLAVIX) 75 MG tablet Take 1 tablet (75 mg total) by mouth daily with breakfast. 10/12/13  Yes Chipper Herb, MD  diazepam (VALIUM) 5 MG tablet Take 5 mg by mouth daily as needed for anxiety or muscle spasms. 10/12/13  Yes Chipper Herb, MD  levothyroxine (SYNTHROID) 200 MCG tablet Take 1 tablet (200 mcg total) by mouth daily before breakfast. 10/12/13  Yes Chipper Herb, MD  promethazine (PHENERGAN) 25 MG tablet Take 25 mg by mouth every 12 (twelve) hours as needed for nausea or vomiting.   Yes Historical Provider, MD  traMADol (ULTRAM) 50 MG tablet Take 75 mg by mouth 2 (two) times daily as needed (for pain).   Yes Historical Provider, MD   BP 181/67  Pulse 75  Temp(Src) 98.9 F (37.2 C) (Oral)  Resp 18  SpO2 98% Physical Exam  Nursing note and vitals reviewed. Constitutional: She appears well-developed and well-nourished. No distress.   HENT:  Head: Normocephalic and atraumatic.  Mouth/Throat: Oropharynx is clear and moist. No oropharyngeal exudate.  Eyes: Conjunctivae and EOM are normal. Pupils are equal, round, and reactive to light. Right eye exhibits no discharge. Left eye exhibits no discharge. No scleral icterus.  Neck: Normal range of motion. Neck supple. No JVD present. No thyromegaly present.  Cardiovascular: Normal rate, regular rhythm, normal heart sounds and intact distal pulses.  Exam reveals no gallop and no friction rub.   No murmur heard. Pulmonary/Chest: Effort normal and breath sounds normal. No respiratory distress. She has no wheezes. She has no rales.  Abdominal: Soft. Bowel sounds are normal. She exhibits no distension and no mass. There is no tenderness.  Musculoskeletal: Normal range of motion. She exhibits no edema and no tenderness.  Lymphadenopathy:    She has no cervical adenopathy.  Neurological: She is alert. Coordination normal.  Neurologic exam:  Speech clear, pupils equal round reactive to light, extraocular movements intact  Normal peripheral visual fields Cranial nerves III through XII normal including no facial droop Follows commands, moves all extremities x4, normal strength to bilateral upper and lower extremities at all major muscle groups including grip Sensation normal to light touch and pinprick Coordination intact, no limb ataxia, finger-nose-finger normal Rapid alternating movements normal No pronator drift    Skin: Skin is warm and dry. No rash noted. No erythema.  Psychiatric: She has a normal mood and affect. Her behavior is normal.    ED Course  Procedures (including critical care time) Labs Review Labs Reviewed  COMPREHENSIVE METABOLIC PANEL - Abnormal; Notable for the following:    Albumin 3.4 (*)    GFR calc non Af Amer 80 (*)    All other components within normal limits  PROTIME-INR  APTT  CBC  DIFFERENTIAL  CBG MONITORING, ED  I-STAT TROPOININ, ED     Imaging Review Ct Head (brain) Wo Contrast  06/15/2014   CLINICAL DATA:  Headache at 3 o'clock today.  History migraines.  EXAM: CT HEAD WITHOUT CONTRAST  TECHNIQUE: Contiguous axial images were obtained from the base of the skull through the vertex without intravenous contrast.  COMPARISON:  04/14/2013  FINDINGS: There is no evidence of mass effect, midline shift, or extra-axial fluid collections. There is no evidence of a space-occupying lesion or intracranial hemorrhage. There is no evidence of a cortical-based area of acute infarction. There is generalized cerebral atrophy. There is periventricular white matter low attenuation likely secondary to microangiopathy.  The ventricles and sulci are appropriate for the patient's age. The basal cisterns are patent.  Visualized portions of the orbits are unremarkable. The visualized portions of the paranasal sinuses and mastoid air cells are unremarkable.  The osseous structures are unremarkable.  IMPRESSION: No acute intracranial pathology.   Electronically Signed   By: Kathreen Devoid   On: 06/15/2014 15:28     EKG Interpretation   Date/Time:  Thursday June 15 2014 12:12:51 EDT Ventricular  Rate:  67 PR Interval:  164 QRS Duration: 92 QT Interval:  414 QTC Calculation: 437 R Axis:   70 Text Interpretation:  Normal sinus rhythm Normal ECG since last tracing no  significant change Confirmed by Melanye Hiraldo  MD, Alyzae Hawkey ([id number]) on 06/15/2014  1:15:21 PM      MDM   Final diagnoses:  Transient cerebral ischemia, unspecified transient cerebral ischemia type  Essential hypertension  Other headache syndrome    Benign exam at this time, ongoing headache, we'll give medication for headache while we await CT results, labs and neurologic consultation. Though her symptoms started 3 hours ago they have completely resolved, she is not a code stroke candidate.  D/w Collier Salina with Neurohospitalist who will come to see pt. - 1:53 PM  Neuro has seen pt - and  requests hospitalist admission for repeat TIA w/u.  Paged for admit  D/w hospitalist who will admit  Johnna Acosta, MD 06/15/14 1534  Johnna Acosta, MD 06/15/14 [phone]

## 2014-06-16 ENCOUNTER — Encounter (HOSPITAL_COMMUNITY): Payer: Self-pay | Admitting: General Practice

## 2014-06-16 DIAGNOSIS — G458 Other transient cerebral ischemic attacks and related syndromes: Secondary | ICD-10-CM

## 2014-06-16 LAB — LIPID PANEL
CHOL/HDL RATIO: 2.3 ratio
CHOLESTEROL: 99 mg/dL (ref 0–200)
HDL: 44 mg/dL (ref >39)
LDL Cholesterol: 45 mg/dL (ref 0–99)
Triglycerides: 51 mg/dL (ref <150)
VLDL: 10 mg/dL (ref 0–40)

## 2014-06-16 LAB — RAPID URINE DRUG SCREEN, HOSP PERFORMED
Amphetamines: NOT DETECTED
Barbiturates: NOT DETECTED
Benzodiazepines: POSITIVE — AB
Cocaine: NOT DETECTED
Opiates: NOT DETECTED
TETRAHYDROCANNABINOL: NOT DETECTED

## 2014-06-16 LAB — HEMOGLOBIN A1C
Hgb A1c MFr Bld: 5.1 % (ref <5.7)
MEAN PLASMA GLUCOSE: 100 mg/dL (ref <117)

## 2014-06-16 MED ORDER — OXYCODONE-ACETAMINOPHEN 5-325 MG PO TABS: 1.0000 | ORAL_TABLET | Freq: Three times a day (TID) | ORAL | Status: DC | PRN

## 2014-06-16 NOTE — Discharge Summary (Signed)
Physician Discharge Summary  Cindy Duran IZT:[id number] DOB: [birth year] DOA: 06/15/2014  PCP: Redge Gainer, MD  Admit date: 06/15/2014 Discharge date: 06/16/2014  Time spent: 35 minutes  Recommendations for Outpatient Follow-up:  1. Please follow-up on patient's TIA symptoms, was set up to follow-up with [facility] neurology on discharge.  Discharge Diagnoses:  Active Problems:   HYPERTENSION, BENIGN   TIA (transient ischemic attack)   Discharge Condition: Stable/improved  Diet recommendation: Heart healthy diet  Filed Weights   06/16/14 0500  Weight: 81.421 kg (179 lb 8 oz)    History of present illness:  Cindy Duran is a 78 y.o. female with past medical history of TIA in August 2014, family history of stroke, hypertension (previously on anti-hypertensive therapy), hyperlipidemia, migraine headaches without aura, hypothyroidism on replacement, and osteoarthritis who presents with chief complaint of left hand/arm and face numbness/tingling. She relates that this a.m. she started having left thumb numbness that extended into her forearm lasted about 15 minutes. As per daughter she was slow to response during this time. She relates she was having headache during this time. But they did not see any slurred speech or ataxia.   Hospital Course:  Patient is a pleasant 78 year old female with a past medical history of hypertension, having a history of TIA who was admitted to the medicine service on 06/15/2014. She presented with complaints of left facial numbness/tingling along with left hand numbness. Symptoms lasted for approximate 20 minutes and spontaneously resolved. She was worked up with CT scan of brain that did not reveal acute intracranial pathology. She was further worked up with an MRI of head which was negative for acute ischemia. She did not have further episodes of paresthesias, remained neurologically stable. She was discharged to home in stable condition on 06/16/2014. Patient was  maintained on aspirin and Plavix.   Discharge Exam: Filed Vitals:   06/16/14 0900  BP: 150/52  Pulse: 72  Temp: 98.6 F (37 C)  Resp: 16    General: No acute distress she is awake alert oriented, states feeling well Cardiovascular: Regular rate and rhythm normal S1-S2 no murmurs rubs or gallops Respiratory: Normal respiratory effort, lungs are clear to auscultation bilaterally Abdomen: Soft nontender nondistended Neurological: Nonfocal neurologic examination, cranial nerves II-12 grossly intact, 5 of 5 muscle strength to bilateral upper and lower extremities  Discharge Instructions You were cared for by a hospitalist during your hospital stay. If you have any questions about your discharge medications or the care you received while you were in the hospital after you are discharged, you can call the unit and asked to speak with the hospitalist on call if the hospitalist that took care of you is not available. Once you are discharged, your primary care physician will handle any further medical issues. Please note that NO REFILLS for any discharge medications will be authorized once you are discharged, as it is imperative that you return to your primary care physician (or establish a relationship with a primary care physician if you do not have one) for your aftercare needs so that they can reassess your need for medications and monitor your lab values.  Discharge Instructions   Call MD for:  difficulty breathing, headache or visual disturbances    Complete by:  As directed      Call MD for:  extreme fatigue    Complete by:  As directed      Call MD for:  hives    Complete by:  As directed  Call MD for:  persistant dizziness or light-headedness    Complete by:  As directed      Call MD for:  persistant nausea and vomiting    Complete by:  As directed      Call MD for:  redness, tenderness, or signs of infection (pain, swelling, redness, odor or green/yellow discharge around incision  site)    Complete by:  As directed      Call MD for:  severe uncontrolled pain    Complete by:  As directed      Call MD for:  temperature >100.4    Complete by:  As directed      Diet - low sodium heart healthy    Complete by:  As directed      Increase activity slowly    Complete by:  As directed           Current Discharge Medication List    CONTINUE these medications which have NOT CHANGED   Details  aspirin-acetaminophen-caffeine (EXCEDRIN MIGRAINE) 250-250-65 MG per tablet Take 2 tablets by mouth 2 (two) times daily as needed for headache or migraine.    atorvastatin (LIPITOR) 20 MG tablet Take 1 tablet (20 mg total) by mouth daily. Qty: 90 tablet, Refills: 3    Cholecalciferol (VITAMIN D-3) 5000 UNITS TABS Take 2 capsules by mouth daily.    clopidogrel (PLAVIX) 75 MG tablet Take 1 tablet (75 mg total) by mouth daily with breakfast. Qty: 90 tablet, Refills: 3    diazepam (VALIUM) 5 MG tablet Take 5 mg by mouth daily as needed for anxiety or muscle spasms.    levothyroxine (SYNTHROID) 200 MCG tablet Take 1 tablet (200 mcg total) by mouth daily before breakfast. Qty: 90 tablet, Refills: 3    promethazine (PHENERGAN) 25 MG tablet Take 25 mg by mouth every 12 (twelve) hours as needed for nausea or vomiting.    traMADol (ULTRAM) 50 MG tablet Take 75 mg by mouth 2 (two) times daily as needed (for pain).       Allergies  Allergen Reactions  . Fioricet [Butalbital-Apap-Caffeine] Other (See Comments)    hallucinations  . Iodine Other (See Comments)    "skin comes off"  . Latex Other (See Comments)    Skin peeling off  . Meloxicam Other (See Comments)    Pt can't remember rxn  . Meperidine Hcl Nausea And Vomiting  . Morphine Nausea And Vomiting  . Other     Clorox Bleach  . Penicillins Hives  . Rosuvastatin Other (See Comments)    Pt can't remember rxn  . Simvastatin Other (See Comments)    Pt can't remember rxn  . Sulfonamide Derivatives Swelling   Follow-up  Information   Follow up with Redge Gainer, MD In 2 weeks.   Specialty:  Family Medicine   Contact information:   [address] [place] [address] [phone]       Follow up with [facility] In 2 weeks.   Contact information:   [address] [place] [address] [phone]       The results of significant diagnostics from this hospitalization (including imaging, microbiology, ancillary and laboratory) are listed below for reference.    Significant Diagnostic Studies: Ct Head (brain) Wo Contrast  06/15/2014   CLINICAL DATA:  Headache at 3 o'clock today.  History migraines.  EXAM: CT HEAD WITHOUT CONTRAST  TECHNIQUE: Contiguous axial images were obtained from the base of the skull through the vertex without  intravenous contrast.  COMPARISON:  04/14/2013  FINDINGS: There is no evidence of mass effect, midline shift, or extra-axial fluid collections. There is no evidence of a space-occupying lesion or intracranial hemorrhage. There is no evidence of a cortical-based area of acute infarction. There is generalized cerebral atrophy. There is periventricular white matter low attenuation likely secondary to microangiopathy.  The ventricles and sulci are appropriate for the patient's age. The basal cisterns are patent.  Visualized portions of the orbits are unremarkable. The visualized portions of the paranasal sinuses and mastoid air cells are unremarkable.  The osseous structures are unremarkable.  IMPRESSION: No acute intracranial pathology.   Electronically Signed   By: Kathreen Devoid   On: 06/15/2014 15:28   Mri Brain Without Contrast  06/16/2014   CLINICAL DATA:  LEFT upper extremity numbness lasting 15 min beginning yesterday morning. Headache. History migraine headaches, stroke and hypertension.  EXAM: MRI HEAD WITHOUT CONTRAST  MRA HEAD WITHOUT CONTRAST  TECHNIQUE: Multiplanar, multiecho pulse sequences of the brain and surrounding structures were obtained without  intravenous contrast. Angiographic images of the head were obtained using MRA technique without contrast.  COMPARISON:  CT of the head June 15, 2014 at 1509 hr MRI/MRA of the head April 15, 2013  FINDINGS: MRI HEAD FINDINGS  No reduced diffusion to suggest acute ischemia. No susceptibility artifact to suggest hemorrhage. Ventricles and sulci are normal for patient's age. Patchy to confluent supratentorial white matter FLAIR T2 hyperintensities without midline shift or mass effect. Innumerable punctate T2 hyperintensities within the basal ganglia may reflect perivascular spaces and/or lacunar infarcts.  No abnormal extra-axial fluid collections. Status post RIGHT ocular lens implant. Small RIGHT maxillary mucosal retention cyst without paranasal sinus air-fluid levels. Mastoid air cells are well aerated. No abnormal sellar expansion. No cerebellar tonsillar ectopia. No suspicious calvarial bone marrow signal.  MRA HEAD FINDINGS  Anterior circulation: Normal flow related enhancement of including cervical, petrous, cavernous and supra clinoid internal carotid arteries. Congenitally diminutive RIGHT A1 segment. Somewhat ectatic and possibly fenestrated origin of the anterior cerebral arteries, with small apparent infundibulum, unchanged. Tiny RIGHT greater than LEFT posterior communicating arteries are present. Normal flow related enhancement of the anterior and middle cerebral arteries.  Posterior circulation: LEFT vertebral artery is dominant. Normal flow early enhancement of the vertebral arteries, vertebrobasilar junction basilar artery. Normal flow early enhancement of the main branch vessels. Normal flow early enhancement posterior cerebral arteries.  IMPRESSION: MRI HEAD: No acute intracranial process, specifically no evidence of acute ischemia.  Chronic changes as described above.  MRA HEAD: No acute vascular process or hemodynamically significant stenosis.   Electronically Signed   By: Elon Alas    On: 06/16/2014 03:40   [name] Head/brain Wo Cm  06/16/2014   CLINICAL DATA:  LEFT upper extremity numbness lasting 15 min beginning yesterday morning. Headache. History migraine headaches, stroke and hypertension.  EXAM: MRI HEAD WITHOUT CONTRAST  MRA HEAD WITHOUT CONTRAST  TECHNIQUE: Multiplanar, multiecho pulse sequences of the brain and surrounding structures were obtained without intravenous contrast. Angiographic images of the head were obtained using MRA technique without contrast.  COMPARISON:  CT of the head June 15, 2014 at 1509 hr MRI/MRA of the head April 15, 2013  FINDINGS: MRI HEAD FINDINGS  No reduced diffusion to suggest acute ischemia. No susceptibility artifact to suggest hemorrhage. Ventricles and sulci are normal for patient's age. Patchy to confluent supratentorial white matter FLAIR T2 hyperintensities without midline shift or mass effect. Innumerable punctate T2 hyperintensities within the  basal ganglia may reflect perivascular spaces and/or lacunar infarcts.  No abnormal extra-axial fluid collections. Status post RIGHT ocular lens implant. Small RIGHT maxillary mucosal retention cyst without paranasal sinus air-fluid levels. Mastoid air cells are well aerated. No abnormal sellar expansion. No cerebellar tonsillar ectopia. No suspicious calvarial bone marrow signal.  MRA HEAD FINDINGS  Anterior circulation: Normal flow related enhancement of including cervical, petrous, cavernous and supra clinoid internal carotid arteries. Congenitally diminutive RIGHT A1 segment. Somewhat ectatic and possibly fenestrated origin of the anterior cerebral arteries, with small apparent infundibulum, unchanged. Tiny RIGHT greater than LEFT posterior communicating arteries are present. Normal flow related enhancement of the anterior and middle cerebral arteries.  Posterior circulation: LEFT vertebral artery is dominant. Normal flow early enhancement of the vertebral arteries, vertebrobasilar junction  basilar artery. Normal flow early enhancement of the main branch vessels. Normal flow early enhancement posterior cerebral arteries.  IMPRESSION: MRI HEAD: No acute intracranial process, specifically no evidence of acute ischemia.  Chronic changes as described above.  MRA HEAD: No acute vascular process or hemodynamically significant stenosis.   Electronically Signed   By: Elon Alas   On: 06/16/2014 03:40    Microbiology: No results found for this or any previous visit (from the past 240 hour(s)).   Labs: Basic Metabolic Panel:  Recent Labs Lab 06/15/14 1315  NA 142  K 4.1  CL 104  CO2 26  GLUCOSE 89  BUN 14  CREATININE 0.75  CALCIUM 9.2   Liver Function Tests:  Recent Labs Lab 06/15/14 1315  AST 20  ALT 12  ALKPHOS 93  BILITOT 0.4  PROT 6.4  ALBUMIN 3.4*   No results found for this basename: LIPASE, AMYLASE,  in the last 168 hours No results found for this basename: AMMONIA,  in the last 168 hours CBC:  Recent Labs Lab 06/15/14 1315  WBC 6.7  NEUTROABS 4.3  HGB 12.9  HCT 39.3  MCV 85.2  PLT 189   Cardiac Enzymes: No results found for this basename: CKTOTAL, CKMB, CKMBINDEX, TROPONINI,  in the last 168 hours BNP: BNP (last 3 results) No results found for this basename: PROBNP,  in the last 8760 hours CBG:  Recent Labs Lab 06/15/14 1311  GLUCAP 82       Signed:  Kelly Eisler  [facility] 06/16/2014, 11:22 AM

## 2014-06-16 NOTE — Progress Notes (Signed)
Pt d/c to home by car with family. Assessment stable. Prescription given. Pt verbalizes understanding of d/c instructions.

## 2014-06-16 NOTE — Discharge Instructions (Signed)
Transient Ischemic Attack A transient ischemic attack (TIA) is a "warning stroke" that causes stroke-like symptoms. Unlike a stroke, a TIA does not cause permanent damage to the brain. The symptoms of a TIA can happen very fast and do not last long. It is important to know the symptoms of a TIA and what to do. This can help prevent a major stroke or death. CAUSES   A TIA is caused by a temporary blockage in an artery in the brain or neck (carotid artery). The blockage does not allow the brain to get the blood supply it needs and can cause different symptoms. The blockage can be caused by either:  A blood clot.  Fatty buildup (plaque) in a neck or brain artery. RISK FACTORS  High blood pressure (hypertension).  High cholesterol.  Diabetes mellitus.  Heart disease.  The build up of plaque in the blood vessels (peripheral artery disease or atherosclerosis).  The build up of plaque in the blood vessels providing blood and oxygen to the brain (carotid artery stenosis).  An abnormal heart rhythm (atrial fibrillation).  Obesity.  Smoking.  Taking oral contraceptives (especially in combination with smoking).  Physical inactivity.  A diet high in fats, salt (sodium), and calories.  Alcohol use.  Use of illegal drugs (especially cocaine and methamphetamine).  Being female.  Being African American.  Being over the age of 67.  Family history of stroke.  Previous history of blood clots, stroke, TIA, or heart attack.  Sickle cell disease. SYMPTOMS  TIA symptoms are the same as a stroke but are temporary. These symptoms usually develop suddenly, or may be newly present upon awakening from sleep:  Sudden weakness or numbness of the face, arm, or leg, especially on one side of the body.  Sudden trouble walking or difficulty moving arms or legs.  Sudden confusion.  Sudden personality changes.  Trouble speaking (aphasia) or understanding.  Difficulty swallowing.  Sudden  trouble seeing in one or both eyes.  Double vision.  Dizziness.  Loss of balance or coordination.  Sudden severe headache with no known cause.  Trouble reading or writing.  Loss of bowel or bladder control.  Loss of consciousness. DIAGNOSIS  Your caregiver may be able to determine the presence or absence of a TIA based on your symptoms, history, and physical exam. Computed tomography (CT scan) of the brain is usually performed to help identify a TIA. Other tests may be done to diagnose a TIA. These tests may include:  Electrocardiography.  Continuous heart monitoring.  Echocardiography.  Carotid ultrasonography.  Magnetic resonance imaging (MRI).  A scan of the brain circulation.  Blood tests. PREVENTION  The risk of a TIA can be decreased by appropriately treating high blood pressure, high cholesterol, diabetes, heart disease, and obesity and by quitting smoking, limiting alcohol, and staying physically active. TREATMENT  Time is of the essence. Since the symptoms of TIA are the same as a stroke, it is important to seek treatment as soon as possible because you may need a medicine to dissolve the clot (thrombolytic) that cannot be given if too much time has passed. Treatment options vary. Treatment options may include rest, oxygen, intravenous (IV) fluids, and medicines to thin the blood (anticoagulants). Medicines and diet may be used to address diabetes, high blood pressure, and other risk factors. Measures will be taken to prevent short-term and long-term complications, including infection from breathing foreign material into the lungs (aspiration pneumonia), blood clots in the legs, and falls. Treatment options include procedures  to either remove plaque in the carotid arteries or dilate carotid arteries that have narrowed due to plaque. Those procedures are:  Carotid endarterectomy.  Carotid angioplasty and stenting. HOME CARE INSTRUCTIONS   Take all medicines prescribed  by your caregiver. Follow the directions carefully. Medicines may be used to control risk factors for a stroke. Be sure you understand all your medicine instructions.  You may be told to take aspirin or the anticoagulant warfarin. Warfarin needs to be taken exactly as instructed.  Taking too much or too little warfarin is dangerous. Too much warfarin increases the risk of bleeding. Too little warfarin continues to allow the risk for blood clots. While taking warfarin, you will need to have regular blood tests to measure your blood clotting time. A PT blood test measures how long it takes for blood to clot. Your PT is used to calculate another value called an INR. Your PT and INR help your caregiver to adjust your dose of warfarin. The dose can change for many reasons. It is critically important that you take warfarin exactly as prescribed.  Many foods, especially foods high in vitamin K can interfere with warfarin and affect the PT and INR. Foods high in vitamin K include spinach, kale, broccoli, cabbage, collard and turnip greens, brussels sprouts, peas, cauliflower, seaweed, and parsley as well as beef and pork liver, green tea, and soybean oil. You should eat a consistent amount of foods high in vitamin K. Avoid major changes in your diet, or notify your caregiver before changing your diet. Arrange a visit with a dietitian to answer your questions.  Many medicines can interfere with warfarin and affect the PT and INR. You must tell your caregiver about any and all medicines you take, this includes all vitamins and supplements. Be especially cautious with aspirin and anti-inflammatory medicines. Do not take or discontinue any prescribed or over-the-counter medicine except on the advice of your caregiver or pharmacist.  Warfarin can have side effects, such as excessive bruising or bleeding. You will need to hold pressure over cuts for longer than usual. Your caregiver or pharmacist will discuss other  potential side effects.  Avoid sports or activities that may cause injury or bleeding.  Be mindful when shaving, flossing your teeth, or handling sharp objects.  Alcohol can change the body's ability to handle warfarin. It is best to avoid alcoholic drinks or consume only very small amounts while taking warfarin. Notify your caregiver if you change your alcohol intake.  Notify your dentist or other caregivers before procedures.  Eat a diet that includes 5 or more servings of fruits and vegetables each day. This may reduce the risk of stroke. Certain diets may be prescribed to address high blood pressure, high cholesterol, diabetes, or obesity.  A low-sodium, low-saturated fat, low-trans fat, low-cholesterol diet is recommended to manage high blood pressure.  A low-saturated fat, low-trans fat, low-cholesterol, and high-fiber diet may control cholesterol levels.  A controlled-carbohydrate, controlled-sugar diet is recommended to manage diabetes.  A reduced-calorie, low-sodium, low-saturated fat, low-trans fat, low-cholesterol diet is recommended to manage obesity.  Maintain a healthy weight.  Stay physically active. It is recommended that you get at least 30 minutes of activity on most or all days.  Do not smoke.  Limit alcohol use even if you are not taking warfarin. Moderate alcohol use is considered to be:  No more than 2 drinks each day for men.  No more than 1 drink each day for nonpregnant women.  Stop drug abuse.  Home safety. A safe home environment is important to reduce the risk of falls. Your caregiver may arrange for specialists to evaluate your home. Having grab bars in the bedroom and bathroom is often important. Your caregiver may arrange for equipment to be used at home, such as raised toilets and a seat for the shower.  Follow all instructions for follow-up with your caregiver. This is very important. This includes any referrals and lab tests. Proper follow up can  prevent a stroke or another TIA from occurring. SEEK MEDICAL CARE IF:  You have personality changes.  You have difficulty swallowing.  You are seeing double.  You have dizziness.  You have a fever.  You have skin breakdown. SEEK IMMEDIATE MEDICAL CARE IF:  Any of these symptoms may represent a serious problem that is an emergency. Do not wait to see if the symptoms will go away. Get medical help right away. Call your local emergency services (911 in [place] Do not drive yourself to the hospital.  You have sudden weakness or numbness of the face, arm, or leg, especially on one side of the body.  You have sudden trouble walking or difficulty moving arms or legs.  You have sudden confusion.  You have trouble speaking (aphasia) or understanding.  You have sudden trouble seeing in one or both eyes.  You have a loss of balance or coordination.  You have a sudden, severe headache with no known cause.  You have new chest pain or an irregular heartbeat.  You have a partial or total loss of consciousness. MAKE SURE YOU:   Understand these instructions.  Will watch your condition.  Will get help right away if you are not doing well or get worse. Document Released: 05/14/2005 Document Revised: 08/09/2013 Document Reviewed: 11/09/2013 [facility] Patient Information 2015 [place], [place]. This information is not intended to replace advice given to you by your health care provider. Make sure you discuss any questions you have with your health care provider. Migraine Headache A migraine headache is an intense, throbbing pain on one or both sides of your head. A migraine can last for 30 minutes to several hours. CAUSES  The exact cause of a migraine headache is not always known. However, a migraine may be caused when nerves in the brain become irritated and release chemicals that cause inflammation. This causes pain. Certain things may also trigger migraines, such  as:  Alcohol.  Smoking.  Stress.  Menstruation.  Aged cheeses.  Foods or drinks that contain nitrates, glutamate, aspartame, or tyramine.  Lack of sleep.  Chocolate.  Caffeine.  Hunger.  Physical exertion.  Fatigue.  Medicines used to treat chest pain (nitroglycerine), birth control pills, estrogen, and some blood pressure medicines. SIGNS AND SYMPTOMS  Pain on one or both sides of your head.  Pulsating or throbbing pain.  Severe pain that prevents daily activities.  Pain that is aggravated by any physical activity.  Nausea, vomiting, or both.  Dizziness.  Pain with exposure to bright lights, loud noises, or activity.  General sensitivity to bright lights, loud noises, or smells. Before you get a migraine, you may get warning signs that a migraine is coming (aura). An aura may include:  Seeing flashing lights.  Seeing bright spots, halos, or zigzag lines.  Having tunnel vision or blurred vision.  Having feelings of numbness or tingling.  Having trouble talking.  Having muscle weakness. DIAGNOSIS  A migraine headache is often diagnosed based on:  Symptoms.  Physical exam.  A CT  scan or MRI of your head. These imaging tests cannot diagnose migraines, but they can help rule out other causes of headaches. TREATMENT Medicines may be given for pain and nausea. Medicines can also be given to help prevent recurrent migraines.  HOME CARE INSTRUCTIONS  Only take over-the-counter or prescription medicines for pain or discomfort as directed by your health care provider. The use of long-term narcotics is not recommended.  Lie down in a dark, quiet room when you have a migraine.  Keep a journal to find out what may trigger your migraine headaches. For example, write down:  What you eat and drink.  How much sleep you get.  Any change to your diet or medicines.  Limit alcohol consumption.  Quit smoking if you smoke.  Get 7-9 hours of sleep, or as  recommended by your health care provider.  Limit stress.  Keep lights dim if bright lights bother you and make your migraines worse. SEEK IMMEDIATE MEDICAL CARE IF:   Your migraine becomes severe.  You have a fever.  You have a stiff neck.  You have vision loss.  You have muscular weakness or loss of muscle control.  You start losing your balance or have trouble walking.  You feel faint or pass out.  You have severe symptoms that are different from your first symptoms. MAKE SURE YOU:   Understand these instructions.  Will watch your condition.  Will get help right away if you are not doing well or get worse. Document Released: 08/04/2005 Document Revised: 12/19/2013 Document Reviewed: 04/11/2013 [facility] Patient Information 2015 [place], [place]. This information is not intended to replace advice given to you by your health care provider. Make sure you discuss any questions you have with your health care provider.

## 2014-06-19 LAB — GLUCOSE, CAPILLARY: GLUCOSE-CAPILLARY: 87 mg/dL (ref 70–99)

## 2014-06-24 ENCOUNTER — Other Ambulatory Visit: Payer: Self-pay | Admitting: Family Medicine

## 2014-07-31 ENCOUNTER — Other Ambulatory Visit: Payer: Self-pay | Admitting: Nurse Practitioner

## 2014-07-31 NOTE — Telephone Encounter (Signed)
Tramadol is NOT on med list and she is on roxicet and cannot be on both

## 2014-07-31 NOTE — Telephone Encounter (Signed)
Patient has tramadol listed in history medications.  Last script was 05-22-14.  The  Hospital put her on roxicet and she only had 5 pills which are gone. She has been taking tramadol for years for her migraines and declares that nothing else works.   Advise please.

## 2014-08-01 MED ORDER — TRAMADOL HCL 50 MG PO TABS: 50.0000 mg | ORAL_TABLET | Freq: Three times a day (TID) | ORAL | Status: DC | PRN

## 2014-08-01 NOTE — Telephone Encounter (Signed)
Aware ,script ready. 

## 2014-08-21 ENCOUNTER — Ambulatory Visit: Payer: Medicare Other | Admitting: Family Medicine

## 2014-10-05 ENCOUNTER — Other Ambulatory Visit: Payer: Self-pay | Admitting: Nurse Practitioner

## 2014-10-05 MED ORDER — TRAMADOL HCL 50 MG PO TABS: 50.0000 mg | ORAL_TABLET | Freq: Three times a day (TID) | ORAL | Status: DC | PRN

## 2014-10-18 ENCOUNTER — Other Ambulatory Visit: Payer: Self-pay

## 2014-10-18 MED ORDER — LEVOTHYROXINE SODIUM 200 MCG PO TABS: 200.0000 ug | ORAL_TABLET | Freq: Every day | ORAL | Status: DC

## 2014-10-19 ENCOUNTER — Encounter: Payer: Self-pay | Admitting: Family Medicine

## 2014-10-19 ENCOUNTER — Ambulatory Visit (INDEPENDENT_AMBULATORY_CARE_PROVIDER_SITE_OTHER): Payer: Medicare Other | Admitting: Family Medicine

## 2014-10-19 ENCOUNTER — Other Ambulatory Visit: Payer: Self-pay | Admitting: Family Medicine

## 2014-10-19 VITALS — BP 159/94 | HR 75 | Temp 97.0°F | Ht 66.0 in | Wt 177.0 lb

## 2014-10-19 DIAGNOSIS — R002 Palpitations: Secondary | ICD-10-CM

## 2014-10-19 DIAGNOSIS — E038 Other specified hypothyroidism: Secondary | ICD-10-CM | POA: Diagnosis not present

## 2014-10-19 DIAGNOSIS — I10 Essential (primary) hypertension: Secondary | ICD-10-CM | POA: Diagnosis not present

## 2014-10-19 DIAGNOSIS — E034 Atrophy of thyroid (acquired): Secondary | ICD-10-CM | POA: Diagnosis not present

## 2014-10-19 NOTE — Progress Notes (Signed)
24 hr bp monitor applied pt voices understanding

## 2014-10-19 NOTE — Addendum Note (Signed)
Addended by: Earlene Plater on: 10/19/2014 10:22 AM   Modules accepted: Miquel Dunn

## 2014-10-19 NOTE — Addendum Note (Signed)
Addended by: Zannie Cove on: 10/19/2014 10:22 AM   Modules accepted: Orders

## 2014-10-19 NOTE — Patient Instructions (Signed)
Continue to monitor blood pressure readings at home We will do a 24-hour blood pressure monitor on you We will follow this with a [employer] monitor in which she will hit the button when you have these irregular fast beats. We will also arrange for you to see the cardiologist because of these palpitations.

## 2014-10-19 NOTE — Progress Notes (Signed)
Subjective:    Patient ID: Cindy Duran, female    DOB: 04/10/1936, 79 y.o.   MRN: [id number]  HPI Patient here today due to her elevated Blood pressure. She states that awhile back she was stopped from taking her BP meds because it was dropping her BP too low and she once passed out. In the last few months she has noticed her BP getting higher at times. Some of her home readings are running 177/114 and 144/109. The patient comes in today and is very alert. She has a history of a CVA and does not drive anymore. She also complains of palpitations but the elevated blood pressure readings are not associated with the times she has the palpitations. The palpitations occur randomly and not on a specific time.         Patient Active Problem List   Diagnosis Date Noted  . Allergic rhinitis due to pollen 04/12/2014  . Vitamin D deficiency 04/12/2014  . Other migraine with status migrainosus, not intractable 04/12/2014  . Osteoporosis, senile 11/16/2013  . Osteoarthritis 10/12/2013  . TIA (transient ischemic attack) 04/16/2013  . Hyperlipidemia 04/16/2013  . HYPERTENSION, BENIGN 05/30/2009  . SHORTNESS OF BREATH 05/30/2009   Outpatient Encounter Prescriptions as of 10/19/2014  Medication Sig  . aspirin-acetaminophen-caffeine (EXCEDRIN MIGRAINE) [phone] MG per tablet Take 2 tablets by mouth 2 (two) times daily as needed for headache or migraine.  Marland Kitchen atorvastatin (LIPITOR) 20 MG tablet Take 1 tablet (20 mg total) by mouth daily.  . Cholecalciferol (VITAMIN D-3) 5000 UNITS TABS Take 2 capsules by mouth daily.  . clopidogrel (PLAVIX) 75 MG tablet Take 1 tablet (75 mg total) by mouth daily with breakfast.  . diazepam (VALIUM) 5 MG tablet TAKE ONE TABLET BY MOUTH DAILY AS NEEDED FOR ANXIETY  . levothyroxine (SYNTHROID) 200 MCG tablet Take 1 tablet (200 mcg total) by mouth daily before breakfast.  . promethazine (PHENERGAN) 25 MG tablet Take 25 mg by mouth every 12 (twelve) hours as needed for  nausea or vomiting.  . traMADol (ULTRAM) 50 MG tablet Take 1 tablet (50 mg total) by mouth every 8 (eight) hours as needed.  . [DISCONTINUED] oxyCODONE-acetaminophen (ROXICET) 5-325 MG per tablet Take 1 tablet by mouth every 8 (eight) hours as needed.  . [DISCONTINUED] traMADol (ULTRAM) 50 MG tablet Take 1 tablet (50 mg total) by mouth every 8 (eight) hours as needed.    Review of Systems  Constitutional: Negative.   HENT: Negative.   Eyes: Negative.   Respiratory: Negative.   Cardiovascular: Negative.        Heart rate and BP is increasing   Gastrointestinal: Negative.   Endocrine: Negative.   Genitourinary: Negative.   Musculoskeletal: Negative.   Skin: Negative.   Allergic/Immunologic: Negative.   Neurological: Negative.   Hematological: Negative.   Psychiatric/Behavioral: Negative.        Objective:   Physical Exam  Constitutional: She is oriented to person, place, and time. She appears well-developed and well-nourished. No distress.  HENT:  Head: Normocephalic.  Eyes: Conjunctivae and EOM are normal. Pupils are equal, round, and reactive to light. Right eye exhibits no discharge. Left eye exhibits no discharge. No scleral icterus.  Cardiovascular: Normal rate, regular rhythm and normal heart sounds.   No murmur heard. The rhythm is regular with a rate of 72/m  Pulmonary/Chest: Effort normal and breath sounds normal. She has no wheezes. She has no rales.  Musculoskeletal: Normal range of motion. She exhibits no edema.  Neurological: She  is alert and oriented to person, place, and time.  Skin: Skin is warm and dry. No rash noted.  Psychiatric: She has a normal mood and affect. Her behavior is normal. Judgment and thought content normal.  Nursing note and vitals reviewed.  BP 129/84 mmHg  Pulse 75  Temp(Src) 97 F (36.1 C) (Oral)  Ht _0  (1.676 m)  Wt 177 lb (80.287 kg)  BMI 28.58 kg/m2  Blood pressures were checked in both arms by me and the reading in the right  arm was 160/100 sitting and the reading in the left arm was 168/100 sitting A repeat blood pressure with the mobile monitor checked the same way as it was checked when she came in was elevated at 154/92 this time in the left arm.      Assessment & Plan:  1. Essential hypertension -24 hour blood pressure monitor -Change batteries in patient's home monitor -If blood pressures remain elevated on the home 24-hour monitor consider a beta blocker for blood pressure control - BMP8+EGFR  2. Palpitations -After the 24-hour blood pressure monitor is reviewed please arrange to put on a [employer] monitor to try to determine what kind of palpitation she is having.  3. Hypothyroidism due to acquired atrophy of thyroid -Thyroid profile today to make sure that this is not playing a role with her palpitations  Patient Instructions  Continue to monitor blood pressure readings at home We will do a 24-hour blood pressure monitor on you We will follow this with a [employer] monitor in which she will hit the button when you have these irregular fast beats. We will also arrange for you to see the cardiologist because of these palpitations.   Arrie Senate MD   .

## 2014-10-19 NOTE — Addendum Note (Signed)
Addended by: Zannie Cove on: 10/19/2014 10:40 AM   Modules accepted: Orders

## 2014-10-20 ENCOUNTER — Ambulatory Visit (INDEPENDENT_AMBULATORY_CARE_PROVIDER_SITE_OTHER): Payer: Medicare Other | Admitting: *Deleted

## 2014-10-20 ENCOUNTER — Ambulatory Visit: Payer: Self-pay

## 2014-10-20 DIAGNOSIS — R002 Palpitations: Secondary | ICD-10-CM

## 2014-10-20 LAB — BMP8+EGFR
BUN/Creatinine Ratio: 16 (ref 11–26)
BUN: 13 mg/dL (ref 8–27)
CO2: 21 mmol/L (ref 18–29)
Calcium: 9.8 mg/dL (ref 8.7–10.3)
Chloride: 103 mmol/L (ref 97–108)
Creatinine, Ser: 0.8 mg/dL (ref 0.57–1.00)
GFR calc Af Amer: 83 mL/min/{1.73_m2} (ref >59)
GFR calc non Af Amer: 72 mL/min/{1.73_m2} (ref >59)
Glucose: 88 mg/dL (ref 65–99)
Potassium: 4.2 mmol/L (ref 3.5–5.2)
Sodium: 143 mmol/L (ref 134–144)

## 2014-10-20 LAB — THYROID PANEL WITH TSH
Free Thyroxine Index: 1.8 (ref 1.2–4.9)
T3 UPTAKE RATIO: 25 % (ref 24–39)
T4 TOTAL: 7.1 ug/dL (ref 4.5–12.0)
TSH: 10.42 u[IU]/mL — ABNORMAL HIGH (ref 0.450–4.500)

## 2014-10-20 NOTE — Progress Notes (Signed)
[employer] 30 day cardiac event monitor placed and patient verbalized understanding of instructions on how to record event and call them into [facility].

## 2014-10-20 NOTE — Addendum Note (Signed)
Addended by: Louann Sjogren A on: 10/20/2014 12:43 PM   Modules accepted: Orders

## 2014-10-24 NOTE — Addendum Note (Signed)
Addended by: Thana Ates on: 10/24/2014 04:19 PM   Modules accepted: Orders

## 2014-10-30 ENCOUNTER — Telehealth: Payer: Self-pay | Admitting: *Deleted

## 2014-10-30 NOTE — Telephone Encounter (Signed)
Pt aware of KOH monitor and aware that we are contacting cardio to get a "soon appt"

## 2014-10-31 ENCOUNTER — Ambulatory Visit: Payer: Self-pay | Admitting: Endocrinology

## 2014-11-13 ENCOUNTER — Ambulatory Visit: Payer: Medicare Other | Admitting: Family Medicine

## 2014-11-20 ENCOUNTER — Ambulatory Visit: Payer: Self-pay | Admitting: Endocrinology

## 2014-11-23 ENCOUNTER — Ambulatory Visit: Payer: Self-pay | Admitting: Cardiology

## 2014-11-27 ENCOUNTER — Encounter: Payer: Self-pay | Admitting: Endocrinology

## 2014-11-27 ENCOUNTER — Ambulatory Visit (INDEPENDENT_AMBULATORY_CARE_PROVIDER_SITE_OTHER): Payer: Medicare Other | Admitting: Endocrinology

## 2014-11-27 VITALS — BP 132/86 | HR 84 | Temp 97.6°F | Ht 66.0 in | Wt 178.0 lb

## 2014-11-27 DIAGNOSIS — E039 Hypothyroidism, unspecified: Secondary | ICD-10-CM | POA: Diagnosis not present

## 2014-11-27 LAB — T4, FREE: FREE T4: 1.71 ng/dL — AB (ref 0.60–1.60)

## 2014-11-27 LAB — TSH: TSH: 0.07 u[IU]/mL — AB (ref 0.35–4.50)

## 2014-11-27 MED ORDER — LEVOTHYROXINE SODIUM 150 MCG PO TABS: 150.0000 ug | ORAL_TABLET | Freq: Every day | ORAL | Status: DC

## 2014-11-27 NOTE — Patient Instructions (Signed)
blood tests are requested for you today.  We'll let you know about the results. As the blood test has fluctuated, i favor shooting for anything close to normal.

## 2014-11-27 NOTE — Progress Notes (Signed)
Subjective:    Patient ID: Cindy Duran, female    DOB: 13-Apr-1936, 79 y.o.   MRN: [id number]  HPI Hx is from pt and dtr.  Pt reports hypothyroidism was dx'ed in approx 1998.  she has been on prescribed thyroid hormone therapy since then.  she has never taken kelp or any other type of non-prescribed thyroid product.  she has never had dedicated thyroid imaging.  she has never had thyroid surgery, or XRT to the neck.  she has never been on amiodarone or lithium.  She has slight cramps in the legs, but no assoc numbness.  She has been on synthroid 200 mcg/day, since early 2015, without any recent dosage change.  She says she takes as rx'ed, on an empty stomach, with water only.   Past Medical History  Diagnosis Date  . Hypertension   . Migraine   . Thyroid disease   . High cholesterol     Past Surgical History  Procedure Laterality Date  . Abdominal hysterectomy    . Appendectomy    . Shoulder surgery    . Rotator cuff repair      History   Social History  . Marital Status: Widowed    Spouse Name: N/A  . Number of Children: 3  . Years of Education: [employer]   Occupational History  . Retired    Social History Main Topics  . Smoking status: Never Smoker   . Smokeless tobacco: Never Used  . Alcohol Use: No  . Drug Use: No  . Sexual Activity: No   Other Topics Concern  . Not on file   Social History Narrative   Patient lives with daughters.   Caffeine Use: 1.5 [facility] 12oz daily    Current Outpatient Prescriptions on File Prior to Visit  Medication Sig Dispense Refill  . aspirin-acetaminophen-caffeine (EXCEDRIN MIGRAINE) 250-250-65 MG per tablet Take 2 tablets by mouth 2 (two) times daily as needed for headache or migraine.    Marland Kitchen atorvastatin (LIPITOR) 20 MG tablet Take 1 tablet (20 mg total) by mouth daily. 90 tablet 3  . Cholecalciferol (VITAMIN D-3) 5000 UNITS TABS Take 2 capsules by mouth daily.    . clopidogrel (PLAVIX) 75 MG tablet Take 1 tablet (75 mg  total) by mouth daily with breakfast. 90 tablet 3  . diazepam (VALIUM) 5 MG tablet TAKE ONE TABLET BY MOUTH DAILY AS NEEDED FOR ANXIETY 90 tablet 0  . promethazine (PHENERGAN) 25 MG tablet Take 25 mg by mouth every 12 (twelve) hours as needed for nausea or vomiting.    . traMADol (ULTRAM) 50 MG tablet Take 1 tablet (50 mg total) by mouth every 8 (eight) hours as needed. 60 tablet 0   No current facility-administered medications on file prior to visit.    Allergies  Allergen Reactions  . Fioricet [Butalbital-Apap-Caffeine] Other (See Comments)    hallucinations  . Iodine Other (See Comments)    "skin comes off"  . Latex Other (See Comments)    Skin peeling off  . Meloxicam Other (See Comments)    Pt can't remember rxn  . Meperidine Hcl Nausea And Vomiting  . Morphine Nausea And Vomiting  . Other     Clorox Bleach  . Penicillins Hives  . Rosuvastatin Other (See Comments)    Pt can't remember rxn  . Simvastatin Other (See Comments)    Pt can't remember rxn  . Sulfonamide Derivatives Swelling    Family History  Problem Relation Age of Onset  .  Stroke Mother   . Alzheimer's disease Mother   . Osteoporosis Mother     broken hip  . Stroke Father   . Heart attack Brother   . Heart attack Maternal Aunt   . Stroke Maternal Aunt   . Thyroid disease Daughter     BP 132/86 mmHg  Pulse 84  Temp(Src) 97.6 F (36.4 C) (Oral)  Ht 5\' 6"  (1.676 m)  Wt 178 lb (80.74 kg)  BMI 28.74 kg/m2     Review of Systems  Constitutional: Negative for fatigue.  Neurological: Positive for tremors.   denies hair loss, sob, constipation, blurry vision, cold intolerance, dry skin, rhinorrhea, easy bruising, and syncope.  She has lost weight since CVA.  She has memory loss.  She has chronic arthralgias.      Objective:   Physical Exam VS: see vs page GEN: no distress HEAD: head: no deformity eyes: no periorbital swelling, no proptosis external nose and ears are normal mouth: no lesion  seen NECK: supple, thyroid is slightly enlarged, R>L.  i am unable to appreciate a nodule.  CHEST WALL: no deformity.   LUNGS:  Clear to auscultation.  CV: reg rate and rhythm, no murmur.  ABD: abdomen is soft, nontender.  no hepatosplenomegaly.  not distended.  no hernia.  MUSCULOSKELETAL: muscle bulk and strength are grossly normal.  no obvious joint swelling.  gait is normal and steady EXTEMITIES: no deformity, except for OA changes of the fingers.  no edema. PULSES: no carotid bruit NEURO:  cn 2-12 grossly intact.   readily moves all 4's.  sensation is intact to touch on all 4's.  No tremor. SKIN:  Normal texture and temperature.  No rash or suspicious lesion is visible.  Not diaphoretic. NODES:  None palpable at the neck.   PSYCH: alert, well-oriented.  Does not appear anxious nor depressed.   radiol (09/01/13); carotid US makes no mention of the thyroid.  Also MRA of the carotids (2014) also makes no mention of the thyroid.   Lab Results  Component Value Date   TSH 0.07* 11/27/2014   T4TOTAL 7.1 10/19/2014      Assessment & Plan:  Hypothyroidism, overreplaced.  The reason for the apparent fluctuation in her dosage requirement is unclear.  Patient is advised the following: Patient Instructions  blood tests are requested for you today.  We'll let you know about the results. As the blood test has fluctuated, i favor shooting for anything close to normal.     Addendum: please skip 4 days of the medication altogether, then resume at 150 mcg/d.  Please come back for a follow-up appointment in 1 month.

## 2014-11-28 ENCOUNTER — Other Ambulatory Visit: Payer: Self-pay | Admitting: Family Medicine

## 2014-12-05 ENCOUNTER — Ambulatory Visit: Payer: Self-pay | Admitting: Cardiology

## 2014-12-11 ENCOUNTER — Other Ambulatory Visit: Payer: Self-pay | Admitting: Family Medicine

## 2014-12-13 NOTE — Telephone Encounter (Signed)
Last seen 10/19/14 DWM  Last lipid 04/12/14  If approved print

## 2014-12-14 ENCOUNTER — Encounter: Payer: Self-pay | Admitting: Cardiology

## 2014-12-14 ENCOUNTER — Ambulatory Visit (INDEPENDENT_AMBULATORY_CARE_PROVIDER_SITE_OTHER): Payer: Medicare Other | Admitting: Cardiology

## 2014-12-14 VITALS — BP 142/92 | HR 75 | Ht 66.0 in | Wt 176.0 lb

## 2014-12-14 DIAGNOSIS — I10 Essential (primary) hypertension: Secondary | ICD-10-CM

## 2014-12-14 DIAGNOSIS — E782 Mixed hyperlipidemia: Secondary | ICD-10-CM

## 2014-12-14 DIAGNOSIS — R002 Palpitations: Secondary | ICD-10-CM

## 2014-12-14 MED ORDER — METOPROLOL SUCCINATE ER 25 MG PO TB24: 12.5000 mg | ORAL_TABLET | Freq: Every day | ORAL | Status: DC

## 2014-12-14 NOTE — Progress Notes (Signed)
Cardiology Office Note  Date: 12/14/2014   ID: TAMEAH MIHALKO, DOB February 13, 1936, MRN [id number]  PCP: Redge Gainer, MD  Primary Cardiologist: Rozann Lesches, MD   Chief Complaint  Patient presents with  . Palpitations    History of Present Illness: Cindy Duran is a 79 y.o. female referred for cardiology consultation by Dr. Laurance Flatten. Recent history reviewed. She has had intermittent sense of palpitations and dizziness, no frank syncope. There does not seem to be any particular precipitant. She has had no chest pain. In reviewing the chart I see that she has had fluctuating blood pressures, some symptomatic hypotension resulting in discontinuation of both Norvasc and Diovan HCT along the way. She reports a 30 year history of hypertension.  Records indicate previous evaluation by Dr. Percival Spanish back in October 2010 for evaluation of shortness of breath. She did undergo a reassuring Myoview study at that time as documented below.  She had a cardiac monitor obtained by Dr. Laurance Flatten ranging from March through early April of this year. I reviewed the available strips, underlying rhythm being sinus with PACs and PVCs noted intermittently, brief bursts of PAT as well. She also had an ambulatory blood pressure monitor showing average systolics in the 244W to 102V and average diastolics in the 25D, peak systolic blood pressure was in the 664Q and peak diastolic blood pressure was in the 80s.  I note that her TSH was recently documented at 0.07, and her Synthroid was cut back by Dr. Loanne Drilling.  Follow-up tracing today is reviewed below.   Past Medical History  Diagnosis Date  . Essential hypertension   . Migraine   . Hypothyroidism   . Hyperlipidemia     Past Surgical History  Procedure Laterality Date  . Abdominal hysterectomy    . Appendectomy    . Shoulder surgery    . Rotator cuff repair      Current Outpatient Prescriptions  Medication Sig Dispense Refill  .  aspirin-acetaminophen-caffeine (EXCEDRIN MIGRAINE) 250-250-65 MG per tablet Take 2 tablets by mouth 2 (two) times daily as needed for headache or migraine.    Marland Kitchen atorvastatin (LIPITOR) 20 MG tablet TAKE ONE TABLET BY MOUTH ONE TIME DAILY 90 tablet 0  . Cholecalciferol (VITAMIN D-3) 5000 UNITS TABS Take 2 capsules by mouth daily.    . clopidogrel (PLAVIX) 75 MG tablet TAKE ONE TABLET BY MOUTH DAILY WITH BREAKFAST 90 tablet 1  . diazepam (VALIUM) 5 MG tablet TAKE ONE TABLET BY MOUTH DAILY AS NEEDED (Patient taking differently: TAKE ONE TABLET BY MOUTH DAILY AS NEEDED fo leg cramps) 90 tablet 0  . levothyroxine (SYNTHROID, LEVOTHROID) 150 MCG tablet Take 1 tablet (150 mcg total) by mouth daily. 30 tablet 2  . promethazine (PHENERGAN) 25 MG tablet TAKE 1 TABLET BY MOUTH ONCE A DAY AS NEEDED FOR NAUSEA 30 tablet 1  . traMADol (ULTRAM) 50 MG tablet TAKE  ONE TABLET BY MOUTH EVERY 8 HOURS AS NEEDED (Patient taking differently: TAKE  ONE TABLET BY MOUTH EVERY 8 HOURS AS NEEDED for heaches/arthritis) 60 tablet 0  . metoprolol succinate (TOPROL XL) 25 MG 24 hr tablet Take 0.5 tablets (12.5 mg total) by mouth daily. 15 tablet 3   No current facility-administered medications for this visit.    Allergies:  Fioricet; Iodine; Latex; Meloxicam; Meperidine hcl; Morphine; Other; Penicillins; Rosuvastatin; Simvastatin; and Sulfonamide derivatives   Social History: The patient  reports that she has never smoked. She has never used smokeless tobacco. She reports that she does  not drink alcohol or use illicit drugs.   Family History: The patient's family history includes Alzheimer's disease in her mother; Heart attack in her brother and maternal aunt; Osteoporosis in her mother; Stroke in her father, maternal aunt, and mother; Thyroid disease in her daughter.   ROS:  Please see the history of present illness. Otherwise, complete review of systems is positive for chronic back pain, migraines.  All other systems are  reviewed and negative.   Physical Exam: VS:  BP 142/92 mmHg  Pulse 75  Ht 5\' 6"  (1.676 m)  Wt 176 lb (79.833 kg)  BMI 28.42 kg/m2  SpO2 98%, BMI Body mass index is 28.42 kg/(m^2).  Wt Readings from Last 3 Encounters:  12/14/14 176 lb (79.833 kg)  11/27/14 178 lb (80.74 kg)  10/19/14 177 lb (80.287 kg)     General: Patient appears comfortable at rest. HEENT: Conjunctiva and lids normal, oropharynx clear with moist mucosa. Neck: Supple, no elevated JVP or carotid bruits, no thyromegaly. Lungs: Clear to auscultation, nonlabored breathing at rest. Cardiac: Regular rate and rhythm, no S3 or significant systolic murmur, no pericardial rub. Abdomen: Soft, nontender, bowel sounds present. Extremities: No pitting edema, distal pulses 2+. Skin: Warm and dry. Musculoskeletal: No kyphosis. Neuropsychiatric: Alert and oriented x3, affect grossly appropriate.   ECG: ECG is ordered today and reviewed finding sinus rhythm with nonspecific ST changes, probable early repolarization.   Recent Labwork: 06/15/2014: ALT 12; AST 20; Hemoglobin 12.9; Platelets 189 10/19/2014: BUN 13; Creatinine 0.80; Potassium 4.2; Sodium 143 11/27/2014: TSH 0.07*     Component Value Date/Time   CHOL 99 06/16/2014 0539   CHOL 153 04/12/2014 1240   TRIG 51 06/16/2014 0539   TRIG 80 10/20/2013 1338   HDL 44 06/16/2014 0539   HDL 60 04/12/2014 1240   HDL 47 10/20/2013 1338   CHOLHDL 2.3 06/16/2014 0539   CHOLHDL 2.6 04/12/2014 1240   VLDL 10 06/16/2014 0539   LDLCALC 45 06/16/2014 0539   LDLCALC 77 04/12/2014 1240   LDLCALC 86 10/20/2013 1338    Other Studies Reviewed Today:  1. Adenosine Myoview October 2010: Overall Impression  Exercise Capacity: Adenosine study with no exercise. BP Response: Normal blood pressure response. Clinical Symptoms: chest tight ECG Impression: No significant ST segment change suggestive of ischemia. Overall Impression: Normal stress nuclear study.  2. Carotid Dopplers  08/09/2013 reported no significant ICA stenoses bilaterally.  3. Echocardiogram 04/15/2013: Study Conclusions  - Left ventricle: The cavity size was normal. Wall thickness was normal. Systolic function was normal. The estimated ejection fraction was in the range of 60% to 65%. Wall motion was normal; there were no regional wall motion abnormalities. - Left atrium: The atrium was mildly dilated.   Assessment and Plan:  1. Palpitations with documented PACs, PVCs, and brief bursts of PAT by recent cardiac monitoring. Patient actually did not endorse any major symptoms while she was wearing the monitor, prior to this however has had some dizziness with her palpitations. I wonder whether she has had more prolonged episodes of PAT contributing. We will try low-dose Toprol-XL 12.5 mg daily to see if this helps her symptoms. With reduction in her Synthroid and recent low TSH, she may also have improvement.  2. Essential hypertension, fluctuating blood pressure is noted. Continue to monitor. Would keep off Norvasc and Diovan HCTZ for now.  3. Hyperlipidemia, on Lipitor. LDL has been well controlled.  Current medicines were reviewed with the patient today.   Orders Placed This Encounter  Procedures  .  EKG 12-Lead    Disposition: FU with me in 3 months.   Signed, Satira Sark, MD, [facility] 12/14/2014 10:15 AM    [facility] [place], [place], [place] [address] Phone: [phone]; Fax: [phone]

## 2014-12-14 NOTE — Patient Instructions (Signed)
Your physician has recommended you make the following change in your medication:  Start toprol xl 12.5 mg daily. Continue all other medications the same. Your physician recommends that you schedule a follow-up appointment in: 3 months. You will receive a reminder letter in the mail in about 1-2 months reminding you to call and schedule your appointment. If you don't receive this letter, please contact our office.

## 2015-01-26 ENCOUNTER — Telehealth: Payer: Self-pay | Admitting: Family Medicine

## 2015-01-29 ENCOUNTER — Other Ambulatory Visit: Payer: Self-pay | Admitting: *Deleted

## 2015-01-29 MED ORDER — TRAMADOL HCL 50 MG PO TABS: ORAL_TABLET | ORAL | Status: DC

## 2015-01-29 NOTE — Telephone Encounter (Signed)
Patient last seen in office on 10-19-14. Rx last filled on 12-13-14 for #60. Please advise. If approved please print and route to [facility]

## 2015-03-09 ENCOUNTER — Ambulatory Visit: Payer: Medicare Other | Admitting: Cardiology

## 2015-04-30 ENCOUNTER — Ambulatory Visit (INDEPENDENT_AMBULATORY_CARE_PROVIDER_SITE_OTHER): Payer: Medicare Other | Admitting: Family Medicine

## 2015-04-30 ENCOUNTER — Encounter: Payer: Self-pay | Admitting: Family Medicine

## 2015-04-30 VITALS — BP 155/81 | HR 73 | Temp 97.3°F | Ht 66.0 in | Wt 176.0 lb

## 2015-04-30 DIAGNOSIS — E038 Other specified hypothyroidism: Secondary | ICD-10-CM

## 2015-04-30 DIAGNOSIS — E034 Atrophy of thyroid (acquired): Secondary | ICD-10-CM

## 2015-04-30 DIAGNOSIS — R002 Palpitations: Secondary | ICD-10-CM

## 2015-04-30 DIAGNOSIS — R42 Dizziness and giddiness: Secondary | ICD-10-CM

## 2015-04-30 DIAGNOSIS — I10 Essential (primary) hypertension: Secondary | ICD-10-CM | POA: Diagnosis not present

## 2015-04-30 DIAGNOSIS — G44221 Chronic tension-type headache, intractable: Secondary | ICD-10-CM

## 2015-04-30 DIAGNOSIS — E559 Vitamin D deficiency, unspecified: Secondary | ICD-10-CM | POA: Diagnosis not present

## 2015-04-30 DIAGNOSIS — Z0000 Encounter for general adult medical examination without abnormal findings: Secondary | ICD-10-CM | POA: Diagnosis not present

## 2015-04-30 NOTE — Progress Notes (Signed)
Subjective:    Patient ID: Cindy Duran, female    DOB: 04/23/1936, 79 y.o.   MRN: [id number]  HPI Patient is here today for annual wellness exam and follow up of chronic medical problems which includes hyperlipidemia and hypothyroid. She is taking medications regularly. The patient continues to have her ongoing problems with headaches. She continues to have back pain and knee pain. The knees are somewhat better. She is due for lab work and due to return an FOBT. She is requesting refills on all of her medications. The patient is in good spirits today. Her generalized medical condition has not changed since her last visit. She continues to have dizzy spells at times. This is mostly when she is walking. She also has rapid heartbeat at times mostly when she is up and moving. She does not have any chest pain. The dizzy spells may last for one to 2 minutes. She does have some nausea occasionally and takes Phenergan to relieve this. She has had a persistent cough that has been going on for over 2 months.       Patient Active Problem List   Diagnosis Date Noted  . Hypothyroidism 11/27/2014  . Allergic rhinitis due to pollen 04/12/2014  . Vitamin D deficiency 04/12/2014  . Other migraine with status migrainosus, not intractable 04/12/2014  . Osteoporosis, senile 11/16/2013  . Osteoarthritis 10/12/2013  . TIA (transient ischemic attack) 04/16/2013  . Hyperlipidemia 04/16/2013  . HYPERTENSION, BENIGN 05/30/2009  . SHORTNESS OF BREATH 05/30/2009   Outpatient Encounter Prescriptions as of 04/30/2015  Medication Sig  . aspirin-acetaminophen-caffeine (EXCEDRIN MIGRAINE) 194-174-08 MG per tablet Take 2 tablets by mouth 2 (two) times daily as needed for headache or migraine.  Marland Kitchen atorvastatin (LIPITOR) 20 MG tablet TAKE ONE TABLET BY MOUTH ONE TIME DAILY  . Cholecalciferol (VITAMIN D-3) 5000 UNITS TABS Take 2 capsules by mouth daily.  . clopidogrel (PLAVIX) 75 MG tablet TAKE ONE TABLET BY MOUTH DAILY  WITH BREAKFAST  . diazepam (VALIUM) 5 MG tablet TAKE ONE TABLET BY MOUTH DAILY AS NEEDED (Patient taking differently: TAKE ONE TABLET BY MOUTH DAILY AS NEEDED fo leg cramps)  . levothyroxine (SYNTHROID, LEVOTHROID) 150 MCG tablet Take 1 tablet (150 mcg total) by mouth daily.  . promethazine (PHENERGAN) 25 MG tablet TAKE 1 TABLET BY MOUTH ONCE A DAY AS NEEDED FOR NAUSEA  . traMADol (ULTRAM) 50 MG tablet TAKE  ONE TABLET BY MOUTH EVERY 8 HOURS AS NEEDED  . [DISCONTINUED] metoprolol succinate (TOPROL XL) 25 MG 24 hr tablet Take 0.5 tablets (12.5 mg total) by mouth daily.   No facility-administered encounter medications on file as of 04/30/2015.     Review of Systems  Constitutional: Negative.   Eyes: Negative.   Respiratory: Negative.   Cardiovascular: Negative.   Gastrointestinal: Negative.   Endocrine: Negative.   Genitourinary: Negative.   Musculoskeletal: Positive for back pain and arthralgias (knees hurt but are some better).  Skin: Negative.   Allergic/Immunologic: Negative.   Neurological: Positive for headaches.  Hematological: Negative.   Psychiatric/Behavioral: Negative.        Objective:   Physical Exam  Constitutional: She is oriented to person, place, and time. She appears well-developed and well-nourished. No distress.  HENT:  Head: Normocephalic.  Right Ear: External ear normal.  Left Ear: External ear normal.  Nose: Nose normal.  Mouth/Throat: Oropharynx is clear and moist.  Eyes: Conjunctivae and EOM are normal. Pupils are equal, round, and reactive to light. Right eye exhibits no discharge.  Left eye exhibits no discharge. No scleral icterus.  Neck: Normal range of motion. Neck supple. No thyromegaly present.  Normal range of motion without bruits adenopathy.  Cardiovascular: Normal rate, regular rhythm, normal heart sounds and intact distal pulses.   No murmur heard. Heart is regular at 72/m  Pulmonary/Chest: Effort normal and breath sounds normal. No  respiratory distress. She has no wheezes. She has no rales. She exhibits no tenderness.  Lungs are clear anteriorly and posteriorly  Abdominal: Soft. Bowel sounds are normal. She exhibits no mass. There is no tenderness. There is no rebound and no guarding.  The abdomen is soft without masses or organ enlargement or bruits  Musculoskeletal: She exhibits no edema or tenderness.  The patient has somewhat hesitant range of motion more secondary to the back pain but moves fairly well and needed assistance with a getting up on the table and arising from the table.  Lymphadenopathy:    She has no cervical adenopathy.  Neurological: She is alert and oriented to person, place, and time. She has normal reflexes. No cranial nerve deficit.  Skin: Skin is warm and dry. No rash noted.  Psychiatric: She has a normal mood and affect. Her behavior is normal. Judgment and thought content normal.  Nursing note and vitals reviewed.  BP 155/81 mmHg  Pulse 73  Temp(Src) 97.3 F (36.3 C) (Oral)  Ht '5\' 6"'  (1.676 m)  Wt 176 lb (79.833 kg)  BMI 28.42 kg/m2        Assessment & Plan:  1. Essential hypertension -The blood pressure is slightly elevated today and we will not change treatment because of this we will continue to have her monitor this at home and watch her sodium intake. - BMP8+EGFR - Hepatic function panel - CBC with Differential/Platelet - NMR, lipoprofile  2. Hypothyroidism due to acquired atrophy of thyroid -Continue current treatment pending results of lab work - CBC with Differential/Platelet - Thyroid Panel With TSH  3. Annual physical exam -The patient needs to get her mammogram and this will be scheduled for her before she leaves office today. - BMP8+EGFR - Hepatic function panel - CBC with Differential/Platelet - Thyroid Panel With TSH - NMR, lipoprofile - Vit D  25 hydroxy (rtn osteoporosis monitoring)  4. Vitamin D deficiency -Continue current treatment pending results of  lab work - CBC with Differential/Platelet - Vit D  25 hydroxy (rtn osteoporosis monitoring)  5. Dizziness and giddiness -This is been an ongoing issue with the patient and she is not concerned as she deals with it and does not want to do anything else at this time. She did see the cardiologist and the medication that he gave her made her feel worse so she stopped it.  6. Chronic tension-type headache, intractable -These headaches have also been going on for years and she has a regimen in place to help control this.  7. Palpitations -She also deals with periodic palpitations and does not want to do any other treatment for this at the present time.  -The patient will be given a 90 day refill for all her medications.  Patient Instructions                       Medicare Annual Wellness Visit  [facility] and the medical providers at [facility] strive to bring you the best medical care.  In doing so we not only want to address your current medical conditions and concerns but also to  detect new conditions early and prevent illness, disease and health-related problems.    Medicare offers a yearly Wellness Visit which allows our clinical staff to assess your need for preventative services including immunizations, lifestyle education, counseling to decrease risk of preventable diseases and screening for fall risk and other medical concerns.    This visit is provided free of charge (no copay) for all Medicare recipients. The clinical pharmacists at [facility] have begun to conduct these Wellness Visits which will also include a thorough review of all your medications.    As you primary medical provider recommend that you make an appointment for your Annual Wellness Visit if you have not done so already this year.  You may set up this appointment before you leave today or you may call back ([phone]) and schedule an appointment.  Please make sure when you  call that you mention that you are scheduling your Annual Wellness Visit with the clinical pharmacist so that the appointment may be made for the proper length of time.     Continue current medications. Continue good therapeutic lifestyle changes which include good diet and exercise. Fall precautions discussed with patient. If an FOBT was given today- please return it to our front desk. If you are over 57 years old - you may need Prevnar 45 or the adult Pneumonia vaccine.  **Flu shots will be available soon--- please call and schedule a FLU-CLINIC appointment**  After your visit with [place] today you will receive a survey in the mail or online from [employer] regarding your care with [place]. Please take a moment to fill this out. Your feedback is very important to [place] as you can help [place] better understand your patient needs as well as improve your experience and satisfaction. WE CARE ABOUT YOU!!!   **Please join [place] SEPT.[email or web address], 2016 from 5:00 to 7:00pm for our OPEN HOUSE! Come out and meet our NEW providers** The patient should continue to be careful with walking so that she does not fall. She should use her walker or cane when necessary.   Arrie Senate MD

## 2015-04-30 NOTE — Patient Instructions (Addendum)
Medicare Annual Wellness Visit  [place] and the medical providers at [facility] strive to bring you the best medical care.  In doing so we not only want to address your current medical conditions and concerns but also to detect new conditions early and prevent illness, disease and health-related problems.    Medicare offers a yearly Wellness Visit which allows our clinical staff to assess your need for preventative services including immunizations, lifestyle education, counseling to decrease risk of preventable diseases and screening for fall risk and other medical concerns.    This visit is provided free of charge (no copay) for all Medicare recipients. The clinical pharmacists at [facility] have begun to conduct these Wellness Visits which will also include a thorough review of all your medications.    As you primary medical provider recommend that you make an appointment for your Annual Wellness Visit if you have not done so already this year.  You may set up this appointment before you leave today or you may call back ([phone]) and schedule an appointment.  Please make sure when you call that you mention that you are scheduling your Annual Wellness Visit with the clinical pharmacist so that the appointment may be made for the proper length of time.     Continue current medications. Continue good therapeutic lifestyle changes which include good diet and exercise. Fall precautions discussed with patient. If an FOBT was given today- please return it to our front desk. If you are over 46 years old - you may need Prevnar 55 or the adult Pneumonia vaccine.  **Flu shots will be available soon--- please call and schedule a FLU-CLINIC appointment**  After your visit with [place] today you will receive a survey in the mail or online from [employer] regarding your care with [place]. Please take a moment to fill this out. Your feedback is  very important to [place] as you can help [place] better understand your patient needs as well as improve your experience and satisfaction. WE CARE ABOUT YOU!!!   **Please join [place] SEPT.[email or web address], [id number] from 5:00 to 7:00pm for our OPEN HOUSE! Come out and meet our NEW providers** The patient should continue to be careful with walking so that she does not fall. She should use her walker or cane when necessary.

## 2015-05-01 ENCOUNTER — Telehealth: Payer: Self-pay | Admitting: Family Medicine

## 2015-05-01 LAB — CBC WITH DIFFERENTIAL/PLATELET
Basophils Absolute: 0 10*3/uL (ref 0.0–0.2)
Basos: 0 %
EOS (ABSOLUTE): 0.1 10*3/uL (ref 0.0–0.4)
EOS: 2 %
HEMATOCRIT: 43.8 % (ref 34.0–46.6)
Hemoglobin: 14.2 g/dL (ref 11.1–15.9)
IMMATURE GRANS (ABS): 0 10*3/uL (ref 0.0–0.1)
IMMATURE GRANULOCYTES: 0 %
LYMPHS: 23 %
Lymphocytes Absolute: 1.8 10*3/uL (ref 0.7–3.1)
MCH: 28.1 pg (ref 26.6–33.0)
MCHC: 32.4 g/dL (ref 31.5–35.7)
MCV: 87 fL (ref 79–97)
MONOS ABS: 0.6 10*3/uL (ref 0.1–0.9)
Monocytes: 8 %
NEUTROS PCT: 67 %
Neutrophils Absolute: 5.4 10*3/uL (ref 1.4–7.0)
Platelets: 234 10*3/uL (ref 150–379)
RBC: 5.06 x10E6/uL (ref 3.77–5.28)
RDW: 14.6 % (ref 12.3–15.4)
WBC: 8 10*3/uL (ref 3.4–10.8)

## 2015-05-01 LAB — THYROID PANEL WITH TSH
FREE THYROXINE INDEX: 1.4 (ref 1.2–4.9)
T3 UPTAKE RATIO: 23 % — AB (ref 24–39)
T4 TOTAL: 6 ug/dL (ref 4.5–12.0)
TSH: 10.79 u[IU]/mL — AB (ref 0.450–4.500)

## 2015-05-01 LAB — NMR, LIPOPROFILE
Cholesterol: 162 mg/dL (ref 100–199)
HDL CHOLESTEROL BY NMR: 53 mg/dL (ref >39)
HDL PARTICLE NUMBER: 30.3 umol/L — AB (ref >=30.5)
LDL Particle Number: 953 nmol/L (ref <1000)
LDL SIZE: 21.5 nm (ref >20.5)
LDL-C: 88 mg/dL (ref 0–99)
LP-IR Score: 29 (ref <=45)
SMALL LDL PARTICLE NUMBER: 218 nmol/L (ref <=527)
Triglycerides by NMR: 104 mg/dL (ref 0–149)

## 2015-05-01 LAB — HEPATIC FUNCTION PANEL
ALT: 12 IU/L (ref 0–32)
AST: 21 IU/L (ref 0–40)
Albumin: 4.3 g/dL (ref 3.5–4.8)
Alkaline Phosphatase: 98 IU/L (ref 39–117)
Bilirubin Total: 0.4 mg/dL (ref 0.0–1.2)
Bilirubin, Direct: 0.12 mg/dL (ref 0.00–0.40)
TOTAL PROTEIN: 7 g/dL (ref 6.0–8.5)

## 2015-05-01 LAB — VITAMIN D 25 HYDROXY (VIT D DEFICIENCY, FRACTURES): VIT D 25 HYDROXY: 68.8 ng/mL (ref 30.0–100.0)

## 2015-05-01 LAB — BMP8+EGFR
BUN / CREAT RATIO: 21 (ref 11–26)
BUN: 18 mg/dL (ref 8–27)
CO2: 26 mmol/L (ref 18–29)
CREATININE: 0.85 mg/dL (ref 0.57–1.00)
Calcium: 9.8 mg/dL (ref 8.7–10.3)
Chloride: 105 mmol/L (ref 97–108)
GFR calc Af Amer: 77 mL/min/{1.73_m2} (ref >59)
GFR calc non Af Amer: 67 mL/min/{1.73_m2} (ref >59)
Glucose: 94 mg/dL (ref 65–99)
POTASSIUM: 4.4 mmol/L (ref 3.5–5.2)
SODIUM: 146 mmol/L — AB (ref 134–144)

## 2015-05-01 MED ORDER — DIAZEPAM 5 MG PO TABS: 5.0000 mg | ORAL_TABLET | Freq: Every day | ORAL | Status: DC | PRN

## 2015-05-01 MED ORDER — ATORVASTATIN CALCIUM 20 MG PO TABS: 20.0000 mg | ORAL_TABLET | Freq: Every day | ORAL | Status: DC

## 2015-05-01 MED ORDER — TRAMADOL HCL 50 MG PO TABS: ORAL_TABLET | ORAL | Status: DC

## 2015-05-01 MED ORDER — CLOPIDOGREL BISULFATE 75 MG PO TABS: 75.0000 mg | ORAL_TABLET | Freq: Every day | ORAL | Status: DC

## 2015-05-01 MED ORDER — SYNTHROID 150 MCG PO TABS: 150.0000 ug | ORAL_TABLET | Freq: Every day | ORAL | Status: DC

## 2015-05-01 NOTE — Telephone Encounter (Signed)
Patients aware that rx was sent to pharmacy

## 2015-05-01 NOTE — Addendum Note (Signed)
Addended by: Zannie Cove on: 05/01/2015 12:59 PM   Modules accepted: Orders

## 2015-05-24 ENCOUNTER — Telehealth: Payer: Self-pay | Admitting: Family Medicine

## 2015-05-24 NOTE — Telephone Encounter (Signed)
Have patient come in today and at least get her blood pressure checked in the office today by someone

## 2015-05-25 ENCOUNTER — Ambulatory Visit (INDEPENDENT_AMBULATORY_CARE_PROVIDER_SITE_OTHER): Payer: Medicare Other | Admitting: *Deleted

## 2015-05-25 DIAGNOSIS — Z23 Encounter for immunization: Secondary | ICD-10-CM | POA: Diagnosis not present

## 2015-05-25 NOTE — Telephone Encounter (Signed)
Spoke with daughter this morning and she will bring her by to have her b/p checked.  Wanted to make you aware of this before she comes.

## 2015-06-13 ENCOUNTER — Other Ambulatory Visit: Payer: Self-pay

## 2015-07-30 ENCOUNTER — Other Ambulatory Visit: Payer: Self-pay | Admitting: Family Medicine

## 2015-07-30 MED ORDER — TRAMADOL HCL 50 MG PO TABS: ORAL_TABLET | ORAL | Status: DC

## 2015-07-30 NOTE — Telephone Encounter (Signed)
The patient may have one refill of this prescription

## 2015-07-30 NOTE — Telephone Encounter (Signed)
Patient needs refill on tramadol. Last filled 05-01-15 for 90 pills.  Please advise

## 2015-09-17 ENCOUNTER — Ambulatory Visit: Payer: Self-pay | Admitting: Family Medicine

## 2015-09-18 ENCOUNTER — Encounter: Payer: Self-pay | Admitting: Family Medicine

## 2015-09-24 ENCOUNTER — Other Ambulatory Visit: Payer: Self-pay | Admitting: Family Medicine

## 2015-09-24 MED ORDER — TRAMADOL HCL 50 MG PO TABS: ORAL_TABLET | ORAL | Status: DC

## 2015-09-24 NOTE — Telephone Encounter (Signed)
Ultram rx ready for pick up  

## 2015-09-24 NOTE — Telephone Encounter (Signed)
Patient aware rx ready to be picked up 

## 2015-09-24 NOTE — Telephone Encounter (Signed)
Last filled 07/30/15, last seen 04/30/15

## 2015-09-25 ENCOUNTER — Other Ambulatory Visit: Payer: Self-pay

## 2015-09-25 MED ORDER — DIAZEPAM 5 MG PO TABS: 5.0000 mg | ORAL_TABLET | Freq: Every day | ORAL | Status: DC | PRN

## 2015-09-25 NOTE — Telephone Encounter (Signed)
Last seen 04/30/15  DWM  If approved route to nurse to call into [facility]

## 2015-09-25 NOTE — Telephone Encounter (Signed)
Sent to [facility]

## 2015-10-04 ENCOUNTER — Other Ambulatory Visit: Payer: Self-pay | Admitting: Family Medicine

## 2015-10-04 MED ORDER — PROMETHAZINE HCL 25 MG PO TABS: ORAL_TABLET | ORAL | Status: DC

## 2015-11-07 ENCOUNTER — Observation Stay (HOSPITAL_COMMUNITY)
Admission: EM | Admit: 2015-11-07 | Discharge: 2015-11-08 | Disposition: A | Discharge status: Home / Self Care | Payer: Medicare Other | Attending: Internal Medicine | Admitting: Internal Medicine

## 2015-11-07 ENCOUNTER — Encounter (HOSPITAL_COMMUNITY): Payer: Self-pay

## 2015-11-07 ENCOUNTER — Emergency Department (HOSPITAL_COMMUNITY): Payer: Medicare Other

## 2015-11-07 DIAGNOSIS — Z79899 Other long term (current) drug therapy: Secondary | ICD-10-CM | POA: Insufficient documentation

## 2015-11-07 DIAGNOSIS — M1990 Unspecified osteoarthritis, unspecified site: Secondary | ICD-10-CM | POA: Diagnosis not present

## 2015-11-07 DIAGNOSIS — Z8673 Personal history of transient ischemic attack (TIA), and cerebral infarction without residual deficits: Secondary | ICD-10-CM | POA: Insufficient documentation

## 2015-11-07 DIAGNOSIS — R200 Anesthesia of skin: Secondary | ICD-10-CM | POA: Diagnosis not present

## 2015-11-07 DIAGNOSIS — Z66 Do not resuscitate: Secondary | ICD-10-CM | POA: Insufficient documentation

## 2015-11-07 DIAGNOSIS — M810 Age-related osteoporosis without current pathological fracture: Secondary | ICD-10-CM | POA: Insufficient documentation

## 2015-11-07 DIAGNOSIS — Z7902 Long term (current) use of antithrombotics/antiplatelets: Secondary | ICD-10-CM | POA: Insufficient documentation

## 2015-11-07 DIAGNOSIS — G43801 Other migraine, not intractable, with status migrainosus: Secondary | ICD-10-CM | POA: Diagnosis not present

## 2015-11-07 DIAGNOSIS — I10 Essential (primary) hypertension: Secondary | ICD-10-CM | POA: Diagnosis not present

## 2015-11-07 DIAGNOSIS — E785 Hyperlipidemia, unspecified: Secondary | ICD-10-CM | POA: Diagnosis not present

## 2015-11-07 DIAGNOSIS — N390 Urinary tract infection, site not specified: Secondary | ICD-10-CM | POA: Diagnosis not present

## 2015-11-07 DIAGNOSIS — I6789 Other cerebrovascular disease: Secondary | ICD-10-CM | POA: Diagnosis not present

## 2015-11-07 DIAGNOSIS — G459 Transient cerebral ischemic attack, unspecified: Principal | ICD-10-CM | POA: Insufficient documentation

## 2015-11-07 DIAGNOSIS — E039 Hypothyroidism, unspecified: Secondary | ICD-10-CM | POA: Insufficient documentation

## 2015-11-07 DIAGNOSIS — R202 Paresthesia of skin: Secondary | ICD-10-CM | POA: Diagnosis not present

## 2015-11-07 DIAGNOSIS — R208 Other disturbances of skin sensation: Secondary | ICD-10-CM | POA: Diagnosis present

## 2015-11-07 DIAGNOSIS — R0682 Tachypnea, not elsewhere classified: Secondary | ICD-10-CM | POA: Diagnosis not present

## 2015-11-07 HISTORY — DX: Transient cerebral ischemic attack, unspecified: G45.9

## 2015-11-07 LAB — CBC WITH DIFFERENTIAL/PLATELET
BASOS ABS: 0 10*3/uL (ref 0.0–0.1)
BASOS PCT: 0 %
EOS PCT: 2 %
Eosinophils Absolute: 0.1 10*3/uL (ref 0.0–0.7)
HEMATOCRIT: 40.4 % (ref 36.0–46.0)
Hemoglobin: 13.2 g/dL (ref 12.0–15.0)
LYMPHS PCT: 24 %
Lymphs Abs: 2 10*3/uL (ref 0.7–4.0)
MCH: 27.8 pg (ref 26.0–34.0)
MCHC: 32.7 g/dL (ref 30.0–36.0)
MCV: 85.2 fL (ref 78.0–100.0)
MONO ABS: 0.7 10*3/uL (ref 0.1–1.0)
Monocytes Relative: 8 %
Neutro Abs: 5.5 10*3/uL (ref 1.7–7.7)
Neutrophils Relative %: 66 %
Platelets: 181 10*3/uL (ref 150–400)
RBC: 4.74 MIL/uL (ref 3.87–5.11)
RDW: 13.2 % (ref 11.5–15.5)
WBC: 8.4 10*3/uL (ref 4.0–10.5)

## 2015-11-07 LAB — BASIC METABOLIC PANEL
Anion gap: 11 (ref 5–15)
BUN: 13 mg/dL (ref 6–20)
CALCIUM: 9.1 mg/dL (ref 8.9–10.3)
CO2: 25 mmol/L (ref 22–32)
Chloride: 106 mmol/L (ref 101–111)
Creatinine, Ser: 0.88 mg/dL (ref 0.44–1.00)
GFR calc Af Amer: >60 mL/min (ref >60)
GLUCOSE: 91 mg/dL (ref 65–99)
Potassium: 3.9 mmol/L (ref 3.5–5.1)
Sodium: 142 mmol/L (ref 135–145)

## 2015-11-07 LAB — URINALYSIS, ROUTINE W REFLEX MICROSCOPIC
GLUCOSE, UA: NEGATIVE mg/dL
Hgb urine dipstick: NEGATIVE
KETONES UR: 15 mg/dL — AB
Nitrite: NEGATIVE
PH: 5 (ref 5.0–8.0)
Protein, ur: 30 mg/dL — AB
Specific Gravity, Urine: 1.03 (ref 1.005–1.030)

## 2015-11-07 LAB — URINE MICROSCOPIC-ADD ON

## 2015-11-07 NOTE — H&P (Signed)
[facility] History and Physical  Cindy Duran [id number] DOB: April 17, 1936 DOA: 11/07/2015  Referring physician: ED physician PCP: Redge Gainer, MD  Specialists:   Chief Complaint:  Left facial tingling, left facial droop, slurred speech, increased urinary frequency  HPI: Cindy Duran is a 80 y.o. female with PMH of TIA X 2, migraine headache, hypertension, hyperlipidemia, hypothyroidism, who presents with left facial tingling, left facial droop and slurred speech.  Patient reports that she had one episode of left facial tingling, facial droop and slurred speech last night, which lasted for about 10-15 minutes, then resolved spontaneously. She does not have unilateral weakness or hearing loss. She has intermittent headache which she attributes to migraine headache. Patient also reports having increased urinary frequency recently, but no dysuria or burning on urination. She does not have nausea, vomiting, diarrhea, abdominal pain, chest pain, cough or shortness of breath. Patient reports that she has intermittent mild chest pain and palpitation recently, but currently no chest pain or palpitation.  In ED, patient was found to have positive urinalysis with moderate amount of leukocytes,electrolytes renal function okay. MRI -brain is negative for acute intracranial abnormalities. Patient admitted to inpatient for further intervention and treatment. ED physician discussed with the neurologist, Dr. Nicole Kindred, who recommended TIA workup.  EKG: Not done in ED, will get one.   Where does patient live?   At home  Can patient participate in ADLs? Little  Review of Systems:   General: no fevers, chills, no changes in body weight, has fatigue HEENT: no blurry vision, hearing changes or sore throat Pulm: no dyspnea, coughing, wheezing CV: no chest pain, no palpitations Abd: no nausea, vomiting, abdominal pain, diarrhea, constipation GU: no dysuria, burning on urination, has increased urinary  frequency, no hematuria  Ext: no leg edema Neuro: has Left facial tingling, left facial droop, slurred speech. No hearing loss or unilateral weakness. Skin: no rash MSK: No muscle spasm, no deformity, no limitation of range of movement in spin Heme: No easy bruising.  Travel history: No recent long distant travel.  Allergy:  Allergies  Allergen Reactions  . Fioricet [Butalbital-Apap-Caffeine] Other (See Comments)    hallucinations  . Iodine Other (See Comments)    "skin comes off"  . Latex Other (See Comments)    Skin peeling off  . Meloxicam Other (See Comments)    Pt can't remember rxn  . Meperidine Hcl Nausea And Vomiting  . Morphine Nausea And Vomiting  . Other     Clorox Bleach  . Penicillins Hives  . Rosuvastatin Other (See Comments)    Pt can't remember rxn  . Simvastatin Other (See Comments)    Pt can't remember rxn  . Sulfonamide Derivatives Swelling    Past Medical History  Diagnosis Date  . Essential hypertension   . Migraine   . Hypothyroidism   . Hyperlipidemia   . TIA (transient ischemic attack)     Past Surgical History  Procedure Laterality Date  . Abdominal hysterectomy    . Appendectomy    . Shoulder surgery    . Rotator cuff repair      Social History:  reports that she has never smoked. She has never used smokeless tobacco. She reports that she does not drink alcohol or use illicit drugs.  Family History:  Family History  Problem Relation Age of Onset  . Stroke Mother   . Alzheimer's disease Mother   . Osteoporosis Mother     Broken hip  . Stroke Father   .  Heart attack Brother   . Heart attack Maternal Aunt   . Stroke Maternal Aunt   . Thyroid disease Daughter      Prior to Admission medications   Medication Sig Start Date End Date Taking? Authorizing Provider  atorvastatin (LIPITOR) 20 MG tablet Take 1 tablet (20 mg total) by mouth daily. 05/01/15  Yes Chipper Herb, MD  Cholecalciferol (VITAMIN D-3) 5000 UNITS TABS Take 2  capsules by mouth daily.   Yes Historical Provider, MD  clopidogrel (PLAVIX) 75 MG tablet Take 1 tablet (75 mg total) by mouth daily with breakfast. 05/01/15  Yes Chipper Herb, MD  SYNTHROID 150 MCG tablet Take 1 tablet (150 mcg total) by mouth daily before breakfast. 05/01/15  Yes Chipper Herb, MD  diazepam (VALIUM) 5 MG tablet Take 1 tablet (5 mg total) by mouth daily as needed. Patient not taking: Reported on 11/07/2015 09/25/15   Chipper Herb, MD  levothyroxine (SYNTHROID, LEVOTHROID) 150 MCG tablet Take 1 tablet (150 mcg total) by mouth daily. Patient not taking: Reported on 11/07/2015 11/27/14   Renato Shin, MD  promethazine (PHENERGAN) 25 MG tablet TAKE 1 TABLET BY MOUTH ONCE A DAY AS NEEDED FOR NAUSEA Patient not taking: Reported on 11/07/2015 10/04/15   Wardell Honour, MD  traMADol (ULTRAM) 50 MG tablet TAKE  ONE TABLET BY MOUTH EVERY 8 HOURS AS NEEDED Patient not taking: Reported on 11/07/2015 09/24/15   Mary-Margaret Hassell Done, FNP    Physical Exam: Filed Vitals:   11/07/15 2300 11/07/15 2315 11/07/15 2330 11/07/15 2345  BP: 183/80 168/70 173/76 166/78  Pulse: 63 61 60 60  Temp:      TempSrc:      Resp: 18 15 16 12   Height:      Weight:      SpO2: 97% 96% 96% 96%   General: Not in acute distress HEENT:       Eyes: PERRL, EOMI, no scleral icterus.       ENT: No discharge from the ears and nose, no pharynx injection, no tonsillar enlargement.        Neck: No JVD, no bruit, no mass felt. Heme: No neck lymph node enlargement. Cardiac: S1/S2, RRR, No murmurs, No gallops or rubs. Pulm:  No rales, wheezing, rhonchi or rubs. Abd: Soft, nondistended, nontender, no rebound pain, no organomegaly, BS present. Ext: No pitting leg edema bilaterally. 2+DP/PT pulse bilaterally. Musculoskeletal: No joint deformities, No joint redness or warmth, no limitation of ROM in spin. Skin: No rashes.  Neuro: Alert, oriented X3, cranial nerves II-XII grossly intact, moves all extremities normally.  Muscle strength 5/5 in all extremities, sensation to light touch intact. Brachial reflex 2+ bilaterally. Knee reflex 1+ bilaterally. Negative Babinski's sign. Normal finger to nose test. Psych: Patient is not psychotic, no suicidal or hemocidal ideation.  Labs on Admission:  Basic Metabolic Panel:  Recent Labs Lab 11/07/15 2228  NA 142  K 3.9  CL 106  CO2 25  GLUCOSE 91  BUN 13  CREATININE 0.88  CALCIUM 9.1   Liver Function Tests: No results for input(s): AST, ALT, ALKPHOS, BILITOT, PROT, ALBUMIN in the last 168 hours. No results for input(s): LIPASE, AMYLASE in the last 168 hours. No results for input(s): AMMONIA in the last 168 hours. CBC:  Recent Labs Lab 11/07/15 2228  WBC 8.4  NEUTROABS 5.5  HGB 13.2  HCT 40.4  MCV 85.2  PLT 181   Cardiac Enzymes: No results for input(s): CKTOTAL, CKMB, CKMBINDEX, TROPONINI in the  last 168 hours.  BNP (last 3 results) No results for input(s): BNP in the last 8760 hours.  ProBNP (last 3 results) No results for input(s): PROBNP in the last 8760 hours.  CBG: No results for input(s): GLUCAP in the last 168 hours.  Radiological Exams on Admission: Mr Brain Wo Contrast (neuro Protocol)  11/07/2015  CLINICAL DATA:  Initial evaluation for acute onset left-sided facial tingling. EXAM: MRI HEAD WITHOUT CONTRAST TECHNIQUE: Multiplanar, multiecho pulse sequences of the brain and surrounding structures were obtained without intravenous contrast. COMPARISON:  Prior MRI from 06/15/2014. FINDINGS: Diffuse prominence of the CSF containing spaces compatible with generalized age-related cerebral atrophy. Patchy T2/FLAIR hyperintensity within the periventricular and deep white matter both cerebral hemispheres most consistent with chronic small vessel ischemic disease. Small perivascular cyst and/or small remote lacunar infarcts within the bilateral basal ganglia. No abnormal foci of restricted diffusion to suggest acute intracranial infarct.  Gray-white matter differentiation maintained. Major intracranial vascular flow voids preserved. No acute or chronic intracranial hemorrhage. No mass lesion, midline shift, or mass effect. No hydrocephalus. No extra-axial fluid collection. Craniocervical junction within normal limits. Pituitary gland normal. No acute abnormality about the orbits. Sequela prior lens extraction noted on the right. Paranasal sinuses are clear. Trace opacity within the bilateral mastoid air cells. Inner ear structures grossly normal. Bone marrow signal intensity within normal limits. No scalp soft tissue abnormality. IMPRESSION: 1. No acute intracranial infarct or other process identified. 2. Stable atrophy with chronic microvascular ischemic disease. Electronically Signed   By: Jeannine Boga M.D.   On: 11/07/2015 23:00    Assessment/Plan Principal Problem:   TIA (transient ischemic attack) Active Problems:   HYPERTENSION, BENIGN   Osteoarthritis   Osteoporosis, senile   Other migraine with status migrainosus, not intractable   Hypothyroidism  TIA (transient ischemic attack): pt's symptoms are concerning for TIA. MRI of the brain is negative. Patient reports that she has intermittent palpitation, indicating possible cardiac arrhythmia, such as atrial fibrillation. She states that she had cardiac monitor for 1 month in the past with negative findings.  - will admit to tele bed - Neurology was consulted by EDP, will follow up recommendations. - Risk factor modification: HgbA1c, fasting lipid panel  - MRA of the brain without contrast  - PT consult, OT consult, Speech consult  - 2 d Echocardiogram  - Ekg  - Carotid dopplers  - On plavix - pt may need to have cardiac monitor again to r/o Afib  HTN: Patient is not taking medications currently. Blood pressure is 183/80. She reports that her blood pressure has been fluctuating. -Hold off blood pressure medications to allow permissive  hypertension  Hypothyroidism: Last TSH was 10.790 on 04/30/15 -Continue home Synthroid -Check TSH  DVT ppx: SQ Lovenox  Code Status: DNR Family Communication: Yes, patient's 2 daughters and again2 granddaughters  at bed side Disposition Plan: Admit to inpatient   Date of Service 11/08/2015    Ivor Costa [facility] Pager [phone]  If 7PM-7AM, please contact night-coverage [email or web address] Password [facility] 11/08/2015, 12:07 AM

## 2015-11-07 NOTE — ED Notes (Signed)
Pt arrived via EMS c/o tingling to left side of face.  Denies any c/o pain.  Hx: TIA.  Took 2 tramadol at 1600.

## 2015-11-08 ENCOUNTER — Observation Stay (HOSPITAL_BASED_OUTPATIENT_CLINIC_OR_DEPARTMENT_OTHER): Payer: Medicare Other

## 2015-11-08 ENCOUNTER — Observation Stay (HOSPITAL_COMMUNITY): Payer: Medicare Other

## 2015-11-08 ENCOUNTER — Encounter (HOSPITAL_COMMUNITY): Payer: Self-pay | Admitting: General Practice

## 2015-11-08 DIAGNOSIS — E039 Hypothyroidism, unspecified: Secondary | ICD-10-CM

## 2015-11-08 DIAGNOSIS — G459 Transient cerebral ischemic attack, unspecified: Secondary | ICD-10-CM

## 2015-11-08 DIAGNOSIS — R208 Other disturbances of skin sensation: Secondary | ICD-10-CM

## 2015-11-08 DIAGNOSIS — I10 Essential (primary) hypertension: Secondary | ICD-10-CM | POA: Diagnosis not present

## 2015-11-08 DIAGNOSIS — N390 Urinary tract infection, site not specified: Secondary | ICD-10-CM

## 2015-11-08 DIAGNOSIS — R202 Paresthesia of skin: Secondary | ICD-10-CM | POA: Diagnosis not present

## 2015-11-08 DIAGNOSIS — R200 Anesthesia of skin: Secondary | ICD-10-CM | POA: Diagnosis present

## 2015-11-08 LAB — ECHOCARDIOGRAM COMPLETE
HEIGHTINCHES: 66.5 in
WEIGHTICAEL: 2832 [oz_av]

## 2015-11-08 LAB — LIPID PANEL
CHOL/HDL RATIO: 3 ratio
CHOLESTEROL: 131 mg/dL (ref 0–200)
HDL: 43 mg/dL (ref >40)
LDL Cholesterol: 76 mg/dL (ref 0–99)
TRIGLYCERIDES: 62 mg/dL (ref <150)
VLDL: 12 mg/dL (ref 0–40)

## 2015-11-08 LAB — GLUCOSE, CAPILLARY: Glucose-Capillary: 72 mg/dL (ref 65–99)

## 2015-11-08 LAB — PROTIME-INR
INR: 1.1 (ref 0.00–1.49)
Prothrombin Time: 14.4 seconds (ref 11.6–15.2)

## 2015-11-08 LAB — TSH: TSH: 2.275 u[IU]/mL (ref 0.350–4.500)

## 2015-11-08 LAB — APTT: APTT: 34 s (ref 24–37)

## 2015-11-08 MED ORDER — DEXTROSE 5 % IV SOLN
1.0000 g | Freq: Three times a day (TID) | INTRAVENOUS | Status: DC
  Administered 2015-11-08 (×2): 1 g via INTRAVENOUS
  Filled 2015-11-08 (×5): qty 1

## 2015-11-08 MED ORDER — ENOXAPARIN SODIUM 40 MG/0.4ML ~~LOC~~ SOLN
40.0000 mg | SUBCUTANEOUS | Status: DC
  Administered 2015-11-08: 40 mg via SUBCUTANEOUS
  Filled 2015-11-08: qty 0.4

## 2015-11-08 MED ORDER — CLOPIDOGREL BISULFATE 75 MG PO TABS
75.0000 mg | ORAL_TABLET | Freq: Every day | ORAL | Status: DC
  Administered 2015-11-08: 75 mg via ORAL
  Filled 2015-11-08: qty 1

## 2015-11-08 MED ORDER — LEVOTHYROXINE SODIUM 75 MCG PO TABS
150.0000 ug | ORAL_TABLET | Freq: Every day | ORAL | Status: DC
  Administered 2015-11-08: 150 ug via ORAL
  Filled 2015-11-08: qty 2

## 2015-11-08 MED ORDER — LEVOTHYROXINE SODIUM 150 MCG PO TABS
150.0000 ug | ORAL_TABLET | Freq: Every day | ORAL | Status: DC
  Filled 2015-11-08: qty 1

## 2015-11-08 MED ORDER — ATORVASTATIN CALCIUM 10 MG PO TABS
20.0000 mg | ORAL_TABLET | Freq: Every day | ORAL | Status: DC
  Administered 2015-11-08: 20 mg via ORAL
  Filled 2015-11-08: qty 2

## 2015-11-08 MED ORDER — SENNOSIDES-DOCUSATE SODIUM 8.6-50 MG PO TABS: 1.0000 | ORAL_TABLET | Freq: Every evening | ORAL | Status: DC | PRN

## 2015-11-08 MED ORDER — SODIUM CHLORIDE 0.9 % IV SOLN
INTRAVENOUS | Status: DC
  Administered 2015-11-08: 980 mL via INTRAVENOUS

## 2015-11-08 MED ORDER — TRAMADOL-ACETAMINOPHEN 37.5-325 MG PO TABS
2.0000 | ORAL_TABLET | Freq: Four times a day (QID) | ORAL | Status: DC | PRN
  Administered 2015-11-08: 2 via ORAL
  Filled 2015-11-08: qty 2

## 2015-11-08 MED ORDER — STROKE: EARLY STAGES OF RECOVERY BOOK
Freq: Once | Status: AC
  Administered 2015-11-08: 02:00:00
  Filled 2015-11-08: qty 1

## 2015-11-08 MED ORDER — CIPROFLOXACIN HCL 500 MG PO TABS: 500.0000 mg | ORAL_TABLET | Freq: Two times a day (BID) | ORAL | Status: AC

## 2015-11-08 MED ORDER — VITAMIN D 1000 UNITS PO TABS
2000.0000 [IU] | ORAL_TABLET | Freq: Every day | ORAL | Status: DC
  Administered 2015-11-08: 2000 [IU] via ORAL
  Filled 2015-11-08: qty 2

## 2015-11-08 NOTE — Consult Note (Signed)
Admission H&P    Chief Complaint: Transient left facial droop, left-sided weakness and speech difficulty.  HPI: Cindy Duran is an 80 y.o. female history of hypertension, hyperlipidemia and previous transient ischemic attacks, presenting following transient left facial weakness and numbness as well as speech difficulty and left extremity weakness. Symptoms occurred at about 6:30 PM tonight and then subsequently completely resolved. MRI of her brain was obtained which showed no acute intracranial abnormality. She's been taking Plavix for antiplatelet therapy. NIH stroke score at the time of this evaluation was 0.  LSN: 6:30 PM on 11/07/2015 tPA Given: No: Deficits resolved mRankin:  Past Medical History  Diagnosis Date  . Essential hypertension   . Migraine   . Hypothyroidism   . Hyperlipidemia   . TIA (transient ischemic attack)     Past Surgical History  Procedure Laterality Date  . Abdominal hysterectomy    . Appendectomy    . Shoulder surgery    . Rotator cuff repair      Family History  Problem Relation Age of Onset  . Stroke Mother   . Alzheimer's disease Mother   . Osteoporosis Mother     Broken hip  . Stroke Father   . Heart attack Brother   . Heart attack Maternal Aunt   . Stroke Maternal Aunt   . Thyroid disease Daughter    Social History:  reports that she has never smoked. She has never used smokeless tobacco. She reports that she does not drink alcohol or use illicit drugs.  Allergies:  Allergies  Allergen Reactions  . Fioricet [Butalbital-Apap-Caffeine] Other (See Comments)    hallucinations  . Iodine Other (See Comments)    "skin comes off"  . Latex Other (See Comments)    Skin peeling off  . Meloxicam Other (See Comments)    Pt can't remember rxn  . Meperidine Hcl Nausea And Vomiting  . Morphine Nausea And Vomiting  . Other     Clorox Bleach  . Penicillins Hives  . Rosuvastatin Other (See Comments)    Pt can't remember rxn  . Simvastatin Other  (See Comments)    Pt can't remember rxn  . Sulfonamide Derivatives Swelling    Medications: Patient's preadmission medications were reviewed by me.  ROS: History obtained from the patient  General ROS: negative for - chills, fatigue, fever, night sweats, weight gain or weight loss Psychological ROS: negative for - behavioral disorder, hallucinations, memory difficulties, mood swings or suicidal ideation Ophthalmic ROS: negative for - blurry vision, double vision, eye pain or loss of vision ENT ROS: negative for - epistaxis, nasal discharge, oral lesions, sore throat, tinnitus or vertigo Allergy and Immunology ROS: negative for - hives or itchy/watery eyes Hematological and Lymphatic ROS: negative for - bleeding problems, bruising or swollen lymph nodes Endocrine ROS: negative for - galactorrhea, hair pattern changes, polydipsia/polyuria or temperature intolerance Respiratory ROS: negative for - cough, hemoptysis, shortness of breath or wheezing Cardiovascular ROS: negative for - chest pain, dyspnea on exertion, edema or irregular heartbeat Gastrointestinal ROS: negative for - abdominal pain, diarrhea, hematemesis, nausea/vomiting or stool incontinence Genito-Urinary ROS: negative for - dysuria, hematuria, incontinence or urinary frequency/urgency Musculoskeletal ROS: negative for - joint swelling or muscular weakness Neurological ROS: as noted in HPI Dermatological ROS: negative for rash and skin lesion changes  Physical Examination: Blood pressure 166/78, pulse 60, temperature 98 F (36.7 C), temperature source Oral, resp. rate 12, height 5' 6.5" (1.689 m), weight 80.287 kg (177 lb), SpO2 96 %.  HEENT-  Normocephalic, no lesions, without obvious abnormality.  Normal external eye and conjunctiva.  Normal TM's bilaterally.  Normal auditory canals and external ears. Normal external nose, mucus membranes and septum.  Normal pharynx. Neck supple with no masses, nodes, nodules or  enlargement. Cardiovascular - regular rate and rhythm, S1, S2 normal, no murmur, click, rub or gallop Lungs - chest clear, no wheezing, rales, normal symmetric air entry Abdomen - soft, non-tender; bowel sounds normal; no masses,  no organomegaly Extremities - no joint deformities, effusion, or inflammation and no edema  Neurologic Examination: Mental Status: Alert, oriented, thought content appropriate.  Speech fluent without evidence of aphasia. Able to follow commands without difficulty. Cranial Nerves: II-Visual fields were normal. III/IV/VI-Pupils were equal and reacted normally to light. Extraocular movements were full and conjugate.    V/VII-no facial numbness and no facial weakness. VIII-normal. X-normal speech and symmetrical palatal movement. XI: trapezius strength/neck flexion strength normal bilaterally XII-midline tongue extension with normal strength. Motor: 5/5 bilaterally with normal tone and bulk Sensory: Normal throughout. Deep Tendon Reflexes: 1+ and symmetric. Plantars: Flexor bilaterally Cerebellar: Normal finger-to-nose testing. Carotid auscultation: Normal  Results for orders placed or performed during the hospital encounter of 11/07/15 (from the past 48 hour(s))  Urinalysis, Routine w reflex microscopic     Status: Abnormal   Collection Time: 11/07/15  9:03 PM  Result Value Ref Range   Color, Urine AMBER (A) YELLOW    Comment: BIOCHEMICALS MAY BE AFFECTED BY COLOR   APPearance CLOUDY (A) CLEAR   Specific Gravity, Urine 1.030 1.005 - 1.030   pH 5.0 5.0 - 8.0   Glucose, UA NEGATIVE NEGATIVE mg/dL   Hgb urine dipstick NEGATIVE NEGATIVE   Bilirubin Urine SMALL (A) NEGATIVE   Ketones, ur 15 (A) NEGATIVE mg/dL   Protein, ur 30 (A) NEGATIVE mg/dL   Nitrite NEGATIVE NEGATIVE   Leukocytes, UA MODERATE (A) NEGATIVE  Urine microscopic-add on     Status: Abnormal   Collection Time: 11/07/15  9:03 PM  Result Value Ref Range   Squamous Epithelial / LPF 0-5 (A)  NONE SEEN   WBC, UA 6-30 0 - 5 WBC/hpf   RBC / HPF 0-5 0 - 5 RBC/hpf   Bacteria, UA MANY (A) NONE SEEN   Casts HYALINE CASTS (A) NEGATIVE   Crystals CA OXALATE CRYSTALS (A) NEGATIVE  Basic metabolic panel     Status: None   Collection Time: 11/07/15 10:28 PM  Result Value Ref Range   Sodium 142 135 - 145 mmol/L   Potassium 3.9 3.5 - 5.1 mmol/L   Chloride 106 101 - 111 mmol/L   CO2 25 22 - 32 mmol/L   Glucose, Bld 91 65 - 99 mg/dL   BUN 13 6 - 20 mg/dL   Creatinine, Ser 0.88 0.44 - 1.00 mg/dL   Calcium 9.1 8.9 - 10.3 mg/dL   GFR calc non Af Amer >60 >60 mL/min   GFR calc Af Amer >60 >60 mL/min    Comment: (NOTE) The eGFR has been calculated using the CKD EPI equation. This calculation has not been validated in all clinical situations. eGFR's persistently <60 mL/min signify possible Chronic Kidney Disease.    Anion gap 11 5 - 15  CBC with Differential     Status: None   Collection Time: 11/07/15 10:28 PM  Result Value Ref Range   WBC 8.4 4.0 - 10.5 K/uL   RBC 4.74 3.87 - 5.11 MIL/uL   Hemoglobin 13.2 12.0 - 15.0 g/dL   HCT  40.4 36.0 - 46.0 %   MCV 85.2 78.0 - 100.0 fL   MCH 27.8 26.0 - 34.0 pg   MCHC 32.7 30.0 - 36.0 g/dL   RDW 13.2 11.5 - 15.5 %   Platelets 181 150 - 400 K/uL   Neutrophils Relative % 66 %   Neutro Abs 5.5 1.7 - 7.7 K/uL   Lymphocytes Relative 24 %   Lymphs Abs 2.0 0.7 - 4.0 K/uL   Monocytes Relative 8 %   Monocytes Absolute 0.7 0.1 - 1.0 K/uL   Eosinophils Relative 2 %   Eosinophils Absolute 0.1 0.0 - 0.7 K/uL   Basophils Relative 0 %   Basophils Absolute 0.0 0.0 - 0.1 K/uL   Mr Brain Wo Contrast (neuro Protocol)  11/07/2015  CLINICAL DATA:  Initial evaluation for acute onset left-sided facial tingling. EXAM: MRI HEAD WITHOUT CONTRAST TECHNIQUE: Multiplanar, multiecho pulse sequences of the brain and surrounding structures were obtained without intravenous contrast. COMPARISON:  Prior MRI from 06/15/2014. FINDINGS: Diffuse prominence of the CSF  containing spaces compatible with generalized age-related cerebral atrophy. Patchy T2/FLAIR hyperintensity within the periventricular and deep white matter both cerebral hemispheres most consistent with chronic small vessel ischemic disease. Small perivascular cyst and/or small remote lacunar infarcts within the bilateral basal ganglia. No abnormal foci of restricted diffusion to suggest acute intracranial infarct. Gray-white matter differentiation maintained. Major intracranial vascular flow voids preserved. No acute or chronic intracranial hemorrhage. No mass lesion, midline shift, or mass effect. No hydrocephalus. No extra-axial fluid collection. Craniocervical junction within normal limits. Pituitary gland normal. No acute abnormality about the orbits. Sequela prior lens extraction noted on the right. Paranasal sinuses are clear. Trace opacity within the bilateral mastoid air cells. Inner ear structures grossly normal. Bone marrow signal intensity within normal limits. No scalp soft tissue abnormality. IMPRESSION: 1. No acute intracranial infarct or other process identified. 2. Stable atrophy with chronic microvascular ischemic disease. Electronically Signed   By: Jeannine Boga M.D.   On: 11/07/2015 23:00    Assessment: 80 y.o. female with multiple risk factors for stroke as well as a history of TIAs presenting with recurrent TIA with likely subcortical right MCA involvement.  Stroke Risk Factors - family history, hyperlipidemia and hypertension  Plan: 1. HgbA1c, fasting lipid panel 2. MRA  of the brain without contrast 3. PT consult, OT consult, Speech consult 4. Echocardiogram 5. Carotid dopplers 6. Prophylactic therapy-Antiplatelet med: Plavix  7. Risk factor modification 8. Telemetry monitoring  C.R. Nicole Kindred, MD [facility] [phone]  11/08/2015, 12:18 AM

## 2015-11-08 NOTE — ED Provider Notes (Signed)
CSN: [id number]     Arrival date & time 11/07/15  2005 History   First MD Initiated Contact with Patient 11/07/15 2009     Chief Complaint  Patient presents with  . Tingling     (Consider location/radiation/quality/duration/timing/severity/associated sxs/prior Treatment) HPI Comments: 80 year old female with past medical history including migraines, hypertension, hyperlipidemia, TIA who presents with left facial numbness. Patient states that just after 6 PM today, she had a sudden onset of left facial tingling involving her cheek and chin. She then developed a mild left facial droop at her mouth and mild difficulty with forming words. Her daughter was with her and noticed the mild facial droop but it rapidly improved. All of her symptoms resolved within 10-15 minutes. Currently, she states that she has some mild left facial tingling but denies any other complaints. She has chronic migraines but denies any new headaches. No visual changes. No extremity numbness/weakness. No recent illness.  The history is provided by the patient.    Past Medical History  Diagnosis Date  . Essential hypertension   . Migraine   . Hypothyroidism   . Hyperlipidemia   . TIA (transient ischemic attack)    Past Surgical History  Procedure Laterality Date  . Abdominal hysterectomy    . Appendectomy    . Shoulder surgery    . Rotator cuff repair     Family History  Problem Relation Age of Onset  . Stroke Mother   . Alzheimer's disease Mother   . Osteoporosis Mother     Broken hip  . Stroke Father   . Heart attack Brother   . Heart attack Maternal Aunt   . Stroke Maternal Aunt   . Thyroid disease Daughter    Social History  Substance Use Topics  . Smoking status: Never Smoker   . Smokeless tobacco: Never Used  . Alcohol Use: No   OB History    No data available     Review of Systems  10 Systems reviewed and are negative for acute change except as noted in the HPI.   Allergies  Fioricet;  Iodine; Latex; Meloxicam; Meperidine hcl; Morphine; Other; Penicillins; Rosuvastatin; Simvastatin; and Sulfonamide derivatives  Home Medications   Prior to Admission medications   Medication Sig Start Date End Date Taking? Authorizing Provider  atorvastatin (LIPITOR) 20 MG tablet Take 1 tablet (20 mg total) by mouth daily. 05/01/15  Yes Chipper Herb, MD  Cholecalciferol (VITAMIN D-3) 5000 UNITS TABS Take 2 capsules by mouth daily.   Yes Historical Provider, MD  clopidogrel (PLAVIX) 75 MG tablet Take 1 tablet (75 mg total) by mouth daily with breakfast. 05/01/15  Yes Chipper Herb, MD  SYNTHROID 150 MCG tablet Take 1 tablet (150 mcg total) by mouth daily before breakfast. 05/01/15  Yes Chipper Herb, MD  diazepam (VALIUM) 5 MG tablet Take 1 tablet (5 mg total) by mouth daily as needed. Patient not taking: Reported on 11/07/2015 09/25/15   Chipper Herb, MD  levothyroxine (SYNTHROID, LEVOTHROID) 150 MCG tablet Take 1 tablet (150 mcg total) by mouth daily. Patient not taking: Reported on 11/07/2015 11/27/14   Renato Shin, MD  promethazine (PHENERGAN) 25 MG tablet TAKE 1 TABLET BY MOUTH ONCE A DAY AS NEEDED FOR NAUSEA Patient not taking: Reported on 11/07/2015 10/04/15   Wardell Honour, MD  traMADol (ULTRAM) 50 MG tablet TAKE  ONE TABLET BY MOUTH EVERY 8 HOURS AS NEEDED Patient not taking: Reported on 11/07/2015 09/24/15   Mary-Margaret Hassell Done, FNP  BP 166/78 mmHg  Pulse 60  Temp(Src) 98 F (36.7 C) (Oral)  Resp 12  Ht 5' 6.5" (1.689 m)  Wt 177 lb (80.287 kg)  BMI 28.14 kg/m2  SpO2 96% Physical Exam  Constitutional: She is oriented to person, place, and time. She appears well-developed and well-nourished. No distress.  Awake, alert  HENT:  Head: Normocephalic and atraumatic.  Eyes: Conjunctivae and EOM are normal. Pupils are equal, round, and reactive to light.  Neck: Neck supple.  Cardiovascular: Normal rate, regular rhythm and normal heart sounds.   No murmur heard. Pulmonary/Chest:  Effort normal and breath sounds normal. No respiratory distress.  Abdominal: Soft. Bowel sounds are normal. She exhibits no distension.  Musculoskeletal: She exhibits no edema.  Neurological: She is alert and oriented to person, place, and time. She has normal reflexes. No cranial nerve deficit. She exhibits normal muscle tone.  Fluent speech, normal finger-to-nose testing, negative pronator drift, 5/5 strength and normal sensation throughout  Skin: Skin is warm and dry.  Psychiatric: She has a normal mood and affect. Judgment and thought content normal.  Nursing note and vitals reviewed.   ED Course  Procedures (including critical care time) Labs Review Labs Reviewed  URINALYSIS, ROUTINE W REFLEX MICROSCOPIC (NOT AT [facility]) - Abnormal; Notable for the following:    Color, Urine AMBER (*)    APPearance CLOUDY (*)    Bilirubin Urine SMALL (*)    Ketones, ur 15 (*)    Protein, ur 30 (*)    Leukocytes, UA MODERATE (*)    All other components within normal limits  URINE MICROSCOPIC-ADD ON - Abnormal; Notable for the following:    Squamous Epithelial / LPF 0-5 (*)    Bacteria, UA MANY (*)    Casts HYALINE CASTS (*)    Crystals CA OXALATE CRYSTALS (*)    All other components within normal limits  URINE CULTURE  BASIC METABOLIC PANEL  CBC WITH DIFFERENTIAL/PLATELET  TSH  HEMOGLOBIN A1C  LIPID PANEL  PROTIME-INR  APTT    Imaging Review Mr Brain Wo Contrast (neuro Protocol)  11/07/2015  CLINICAL DATA:  Initial evaluation for acute onset left-sided facial tingling. EXAM: MRI HEAD WITHOUT CONTRAST TECHNIQUE: Multiplanar, multiecho pulse sequences of the brain and surrounding structures were obtained without intravenous contrast. COMPARISON:  Prior MRI from 06/15/2014. FINDINGS: Diffuse prominence of the CSF containing spaces compatible with generalized age-related cerebral atrophy. Patchy T2/FLAIR hyperintensity within the periventricular and deep white matter both cerebral hemispheres  most consistent with chronic small vessel ischemic disease. Small perivascular cyst and/or small remote lacunar infarcts within the bilateral basal ganglia. No abnormal foci of restricted diffusion to suggest acute intracranial infarct. Gray-white matter differentiation maintained. Major intracranial vascular flow voids preserved. No acute or chronic intracranial hemorrhage. No mass lesion, midline shift, or mass effect. No hydrocephalus. No extra-axial fluid collection. Craniocervical junction within normal limits. Pituitary gland normal. No acute abnormality about the orbits. Sequela prior lens extraction noted on the right. Paranasal sinuses are clear. Trace opacity within the bilateral mastoid air cells. Inner ear structures grossly normal. Bone marrow signal intensity within normal limits. No scalp soft tissue abnormality. IMPRESSION: 1. No acute intracranial infarct or other process identified. 2. Stable atrophy with chronic microvascular ischemic disease. Electronically Signed   By: Jeannine Boga M.D.   On: 11/07/2015 23:00   I have personally reviewed and evaluated these lab results as part of my medical decision-making.   EKG Interpretation None      MDM  Final diagnoses:  Transient cerebral ischemia, unspecified transient cerebral ischemia type  Left facial numbness   Patient presents with a brief episode of left facial numbness associated with facial droop and difficulty forming words. Symptoms resolved prior to arrival and on exam patient was well-appearing. Vital signs notable for mild hypertension. Neurologic exam was normal. Obtained above lab work which was unremarkable. Because of concern for stroke, obtained MRI brain which showed chronic microvascular ischemic changes but no new findings. I discussed with neurology, Dr. Nicole Kindred, who recommended admission for TIA workup. Discussed with [facility] hospitalist, Dr. Blaine Hamper; I appreciate his assistance. Patient admitted to medicine for  further workup.  Sharlett Iles, MD 11/08/15 [phone]

## 2015-11-08 NOTE — Progress Notes (Signed)
Pharmacy Antibiotic Note  Cindy Duran is a 80 y.o. female admitted on 11/07/2015 with UTI.  Pharmacy has been consulted for Aztreonam dosing.  Plan: Aztreonam 1gm IV q8h Will f/u renal function, micro data, and pt's clinical condition  Height: 5' 6.5" (168.9 cm) Weight: 177 lb (80.287 kg) IBW/kg (Calculated) : 60.45  Temp (24hrs), Avg:98 F (36.7 C), Min:98 F (36.7 C), Max:98 F (36.7 C)   Recent Labs Lab 11/07/15 2228  WBC 8.4  CREATININE 0.88    Estimated Creatinine Clearance: 57.8 mL/min (by C-G formula based on Cr of 0.88).    Allergies  Allergen Reactions  . Fioricet [Butalbital-Apap-Caffeine] Other (See Comments)    hallucinations  . Iodine Other (See Comments)    "skin comes off"  . Latex Other (See Comments)    Skin peeling off  . Meloxicam Other (See Comments)    Pt can't remember rxn  . Meperidine Hcl Nausea And Vomiting  . Morphine Nausea And Vomiting  . Other     Clorox Bleach  . Penicillins Hives  . Rosuvastatin Other (See Comments)    Pt can't remember rxn  . Simvastatin Other (See Comments)    Pt can't remember rxn  . Sulfonamide Derivatives Swelling    Antimicrobials this admission: 3/23 Aztreonam >>   Microbiology results: 3/23 UCx:     Thank you for allowing pharmacy to be a part of this patient's care.  Sherlon Handing, PharmD, BCPS Clinical pharmacist, pager [phone] 11/08/2015 12:30 AM

## 2015-11-08 NOTE — Discharge Summary (Signed)
Physician Discharge Summary  Cindy Duran [id number] DOB: October 26, 1935 DOA: 11/07/2015  PCP: Redge Gainer, MD  Admit date: 11/07/2015 Discharge date: 11/08/2015  Time spent:  25 minutes  Recommendations for Outpatient Follow-up:  1. Discharge home with PCP follow-up in one week 2. Patient will complete 7 days of antibiotic course on 11/14/2015. Please follow urine culture results as outpatient.   Discharge Diagnoses:  Principal Problem:   TIA (transient ischemic attack)   Active Problems:   HYPERTENSION, BENIGN   Osteoarthritis   Osteoporosis, senile   Other migraine with status migrainosus, not intractable   Hypothyroidism   Left facial numbness   UTI (urinary tract infection)   Discharge Condition: Fair  Diet recommendation: Heart healthy  CODE STATUS: DO NOT RESUSCITATE  Filed Weights   11/07/15 2014  Weight: 80.287 kg (177 lb)    History of present illness:  Please refer to admission H&P for details, in brief, 80 y.o. female with PMH of TIA X 2, migraine headache, hypertension, hyperlipidemia, hypothyroidism, who presented with left facial tingling, left facial droop and slurred speech.  Patient reported that she had one episode of left facial tingling, facial droop and slurred speech on the night prior to admission, which lasted for about 10-15 minutes, then resolved spontaneously. Denied any weakness or numbness. She has intermittent headache which she attributes to migraine headache. Patient also reports having increased urinary frequency with some burning the past 1 week. She denied nausea, vomiting, diarrhea, abdominal pain, chest pain, cough or shortness of breath.  In ED, patient was found to have positive urinalysis with moderate amount of leukocytes, labs otherwise normal.  MRI -brain is negative for acute intracranial abnormalities.  Hospitalist consulted and patient admitted for TIA workup.   Hospital Course:  TIA . No further symptoms. MRI brain  negative for acute findings. Stable on telemetry. LDL of 76.  Carotid Doppler without significant stenosis. 2-D echo shows LVH with EF of 60-60%, no cardiac source of emboli. Showed grade 1 diastolic dysfunction. Continue pravastatin. Patient on Plavix prior to hospitalization which is continued. Counseled on medication adherence. I also counseled on aggressive risk factor modifications. Will have physical therapy evaluate patient prior to discharge.  UTI Receive aztreonam while in the hospital. Will discharge her on oral ciprofloxacin to complete 7 days of antibiotics. Follow cultures and outpatient.  Essential hypertension Not on any medications. Target blood pressure is elevated. Should follow-up with PCP and started on blood pressure medications if remains elevated.  Hypothyroidism TSH normal. Continue Synthroid.   Procedures:  MRI brain  2-D echo  Carotid Doppler  Consultations:  Neurology  Discharge Exam: Filed Vitals:   11/08/15 0800 11/08/15 1354  BP: 177/87 150/61  Pulse: 65 70  Temp: 97.7 F (36.5 C) 97.9 F (36.6 C)  Resp:  20    General: Elderly female not in distress HEENT: Moist mucosa, supple neck Chest: Clear bilaterally CVS: Normal S1 and S2, no murmurs or gallop GI: Soft, nondistended, nontender Musculoskeletal: Warm, no edema CNS: Alert and oriented  Discharge Instructions    Current Discharge Medication List    START taking these medications   Details  ciprofloxacin (CIPRO) 500 MG tablet Take 1 tablet (500 mg total) by mouth 2 (two) times daily. Qty: 12 tablet, Refills: 0      CONTINUE these medications which have NOT CHANGED   Details  atorvastatin (LIPITOR) 20 MG tablet Take 1 tablet (20 mg total) by mouth daily. Qty: 90 tablet, Refills: 3    Cholecalciferol (  VITAMIN D-3) 5000 UNITS TABS Take 2 capsules by mouth daily.    clopidogrel (PLAVIX) 75 MG tablet Take 1 tablet (75 mg total) by mouth daily with breakfast. Qty: 90 tablet,  Refills: 3    SYNTHROID 150 MCG tablet Take 1 tablet (150 mcg total) by mouth daily before breakfast. Qty: 30 tablet, Refills: 6      STOP taking these medications     diazepam (VALIUM) 5 MG tablet      promethazine (PHENERGAN) 25 MG tablet      traMADol (ULTRAM) 50 MG tablet        Allergies  Allergen Reactions  . Fioricet [Butalbital-Apap-Caffeine] Other (See Comments)    hallucinations  . Iodine Other (See Comments)    "skin comes off"  . Latex Other (See Comments)    Skin peeling off  . Meloxicam Other (See Comments)    Pt can't remember rxn  . Meperidine Hcl Nausea And Vomiting  . Morphine Nausea And Vomiting  . Other     Clorox Bleach  . Penicillins Hives  . Rosuvastatin Other (See Comments)    Pt can't remember rxn  . Simvastatin Other (See Comments)    Pt can't remember rxn  . Sulfonamide Derivatives Swelling   Follow-up Information    Follow up with Redge Gainer, MD In 1 week.   Specialty:  Family Medicine   Contact information:   [place] [address] [phone]        The results of significant diagnostics from this hospitalization (including imaging, microbiology, ancillary and laboratory) are listed below for reference.    Significant Diagnostic Studies: Mr Brain Wo Contrast (neuro Protocol)  11/07/2015  CLINICAL DATA:  Initial evaluation for acute onset left-sided facial tingling. EXAM: MRI HEAD WITHOUT CONTRAST TECHNIQUE: Multiplanar, multiecho pulse sequences of the brain and surrounding structures were obtained without intravenous contrast. COMPARISON:  Prior MRI from 06/15/2014. FINDINGS: Diffuse prominence of the CSF containing spaces compatible with generalized age-related cerebral atrophy. Patchy T2/FLAIR hyperintensity within the periventricular and deep white matter both cerebral hemispheres most consistent with chronic small vessel ischemic disease. Small perivascular cyst and/or small remote lacunar infarcts within the  bilateral basal ganglia. No abnormal foci of restricted diffusion to suggest acute intracranial infarct. Gray-white matter differentiation maintained. Major intracranial vascular flow voids preserved. No acute or chronic intracranial hemorrhage. No mass lesion, midline shift, or mass effect. No hydrocephalus. No extra-axial fluid collection. Craniocervical junction within normal limits. Pituitary gland normal. No acute abnormality about the orbits. Sequela prior lens extraction noted on the right. Paranasal sinuses are clear. Trace opacity within the bilateral mastoid air cells. Inner ear structures grossly normal. Bone marrow signal intensity within normal limits. No scalp soft tissue abnormality. IMPRESSION: 1. No acute intracranial infarct or other process identified. 2. Stable atrophy with chronic microvascular ischemic disease. Electronically Signed   By: Jeannine Boga M.D.   On: 11/07/2015 23:00   [name] Head/brain Wo Cm  11/08/2015  CLINICAL DATA:  Initial evaluation for transient left-sided facial tingling an droop with slurred speech. Now resolved. EXAM: MRA HEAD WITHOUT CONTRAST TECHNIQUE: Angiographic images of the Circle of Willis were obtained using MRA technique without intravenous contrast. COMPARISON:  Prior MRI from 11/07/2015. FINDINGS: ANTERIOR CIRCULATION: Visualized distal cervical segments of the internal carotid arteries are patent with antegrade flow. Petrous, cavernous, and supraclinoid segments widely patent bilaterally. A1 segments, anterior communicating artery, and anterior cerebral arteries well opacified. Mild atheromatous irregularity with multifocal stenoses within the ACA  is bilaterally. M1 segments patent without stenosis or occlusion. MCA bifurcations normal. Distal MCA branches symmetric and well opacified bilaterally. POSTERIOR CIRCULATION: Vertebral arteries patent to the vertebrobasilar junction. Left vertebral artery dominant. Posterior inferior cerebral arteries  patent bilaterally. Basilar artery widely patent to its distal aspect. Superior cerebral arteries well opacified. Both posterior cerebral arteries arise from the basilar artery and are well opacified to their distal aspects. No aneurysm or vascular malformation. IMPRESSION: 1. No large or proximal arterial branch occlusion within the intracranial circulation. No high-grade or correctable stenosis. 2. Question mild atheromatous irregularity within the anterior cerebral arteries bilaterally with mild multi focal narrowing. Otherwise negative intracranial MRA. No other significant atheromatous disease identified. Electronically Signed   By: Jeannine Boga M.D.   On: 11/08/2015 01:17    Microbiology: No results found for this or any previous visit (from the past 240 hour(s)).   Labs: Basic Metabolic Panel:  Recent Labs Lab 11/07/15 2228  NA 142  K 3.9  CL 106  CO2 25  GLUCOSE 91  BUN 13  CREATININE 0.88  CALCIUM 9.1   Liver Function Tests: No results for input(s): AST, ALT, ALKPHOS, BILITOT, PROT, ALBUMIN in the last 168 hours. No results for input(s): LIPASE, AMYLASE in the last 168 hours. No results for input(s): AMMONIA in the last 168 hours. CBC:  Recent Labs Lab 11/07/15 2228  WBC 8.4  NEUTROABS 5.5  HGB 13.2  HCT 40.4  MCV 85.2  PLT 181   Cardiac Enzymes: No results for input(s): CKTOTAL, CKMB, CKMBINDEX, TROPONINI in the last 168 hours. BNP: BNP (last 3 results) No results for input(s): BNP in the last 8760 hours.  ProBNP (last 3 results) No results for input(s): PROBNP in the last 8760 hours.  CBG:  Recent Labs Lab 11/08/15 0658  GLUCAP 72       Signed:  Louellen Molder MD.  [facility] 11/08/2015, 3:19 PM

## 2015-11-08 NOTE — Progress Notes (Signed)
VASCULAR LAB PRELIMINARY  PRELIMINARY  PRELIMINARY  PRELIMINARY  Carotid duplex completed.    Preliminary report:  1-39% ICA plaquing.  Vertebral artery flow is antegrade.   Linkyn Gobin, RVT 11/08/2015, 11:25 AM

## 2015-11-08 NOTE — Progress Notes (Signed)
Pt for discharge home today. Discharge orders received. IV and telemetry dcd with dressing clean dry and intact. Discharge instructions and 1 prescription given with verbalized understanding. Family at bedside to assist with discharge. Staff brought patient to lobby via wheelchair at this time. Transported to home by family member.

## 2015-11-08 NOTE — Care Management Note (Signed)
Case Management Note  Patient Details  Name: Cindy Duran MRN: [id number] Date of Birth: Dec 28, 1935  Subjective/Objective:      Pt admitted with TIA. MRI results negative. Pt currently on IV abx for UTI. She is from home with family.               Action/Plan: Awaiting PT/OT recs. CM following for discharge disposition.   Expected Discharge Date:                  Expected Discharge Plan:     In-House Referral:     Discharge planning Services     Post Acute Care Choice:    Choice offered to:     DME Arranged:    DME Agency:     HH Arranged:    HH Agency:     Status of Service:  In process, will continue to follow  Medicare Important Message Given:    Date Medicare IM Given:    Medicare IM give by:    Date Additional Medicare IM Given:    Additional Medicare Important Message give by:     If discussed at [place] of Stay Meetings, dates discussed:    Additional Comments:  Pollie Friar, RN 11/08/2015, 11:24 AM

## 2015-11-08 NOTE — Care Management Note (Signed)
Case Management Note  Patient Details  Name: Cindy Duran MRN: [id number] Date of Birth: 1936/07/23  Subjective/Objective:                    Action/Plan: Patient discharging home with family and self care. No further needs per CM.   Expected Discharge Date:                  Expected Discharge Plan:  Home/Self Care  In-House Referral:     Discharge planning Services     Post Acute Care Choice:    Choice offered to:     DME Arranged:    DME Agency:     HH Arranged:    [place] Agency:     Status of Service:  Completed, signed off  Medicare Important Message Given:    Date Medicare IM Given:    Medicare IM give by:    Date Additional Medicare IM Given:    Additional Medicare Important Message give by:     If discussed at [place] of Stay Meetings, dates discussed:    Additional Comments:  Pollie Friar, RN 11/08/2015, 3:33 PM

## 2015-11-08 NOTE — Progress Notes (Signed)
OT Evaluation  Pt appears at her baseline regarding ADL and mobility. Educated pt/familyon warning signs.symptoms of CVA using FAST.     11/08/15 1545  OT Visit Information  Last OT Received On 11/08/15  Assistance Needed +1  History of Present Illness Pt is a 80 y.o. female with PMH of TIA X 2, migraine headache, hypertension, hyperlipidemia, and hypothyroidism, who presented to the ED with left facial tingling, left facial droop and slurred speech.MRI of her brain was obtained which showed no acute intracranial abnormality  Precautions  Precautions None  Home Living  Family/patient expects to be discharged to: Private residence  [name]  Available Help at Discharge Family;Available PRN/intermittently  Type of Home House  Home Access Stairs to enter  Entrance Stairs-Number of Steps 5  Entrance Stairs-Rails Right  Home Layout One level  Bathroom Shower/Tub Tub/shower unit  Automotive engineer None  Prior Function  Level of Independence Independent  Comments watches granson during the day  Communication  Communication No difficulties (baseline word finding deficits at times)  Pain Assessment  Pain Assessment No/denies pain  Cognition  Arousal/Alertness Awake/alert  Behavior During Therapy WFL for tasks assessed/performed  Overall Cognitive Status Within Functional Limits for tasks assessed  Upper Extremity Assessment  Upper Extremity Assessment Overall WFL for tasks assessed  Lower Extremity Assessment  Lower Extremity Assessment Defer to PT evaluation  Cervical / Trunk Assessment  Cervical / Trunk Assessment Normal  ADL  Overall ADL's  At baseline  Vision- History  Baseline Vision/History Wears glasses  Vision- Assessment  Vision Assessment? No apparent visual deficits  Bed Mobility  Overal bed mobility Independent  Transfers  Overall transfer level Independent  Balance  Overall balance assessment No apparent balance  deficits (not formally assessed)  OT - End of Session  Activity Tolerance Patient tolerated treatment well  Patient left in chair;with call bell/phone within reach;with family/visitor present  Nurse Communication Mobility status;Other (comment) (readytoD/C)  OT Assessment  OT Therapy Diagnosis  Generalized weakness  OT Recommendation/Assessment Patient does not need any further OT services  OT Problem List Decreased activity tolerance  OT Recommendation  Follow Up Recommendations No OT follow up;Supervision - Intermittent  OT Equipment None recommended by OT  Acute Rehab OT Goals  Patient Stated Goal to go home  OT Goal Formulation All assessment and education complete, DC therapy  OT Time Calculation  OT Start Time (ACUTE ONLY) 1525  OT Stop Time (ACUTE ONLY) 1540  OT Time Calculation (min) 15 min  OT G-codes **NOT FOR INPATIENT CLASS**  Functional Assessment Tool Used clinical judgement  Functional Limitation Self care  Self Care Current Status [id number]) [facility]  Self Care Goal Status [id number]) [facility]  Self Care Discharge Status [id number]) [place]  OT General Charges  $OT Visit 1 Procedure  OT Evaluation  $OT Eval Low Complexity 1 Procedure  Written Expression  Dominant Hand Right  Maurie Boettcher, OTR/L  [phone] 11/08/2015

## 2015-11-08 NOTE — Progress Notes (Signed)
STROKE TEAM PROGRESS NOTE   HISTORY OF PRESENT ILLNESS Cindy Duran is an 80 y.o. female history of hypertension, hyperlipidemia and previous transient ischemic attacks, presenting following transient left facial weakness and numbness as well as speech difficulty and left extremity weakness. Symptoms occurred at about 6:30 PM tonight 11/07/2015 ([facility]) and then subsequently completely resolved. MRI of her brain was obtained which showed no acute intracranial abnormality. She's been taking Plavix for antiplatelet therapy. NIH stroke score at the time of evaluation was 0. Patient was not administered IV t-PA secondary to deficits resolved. She was admitted for further evaluation and treatment.   SUBJECTIVE (INTERVAL HISTORY) Her family is at the bedside.  Overall she feels her condition is completely resolved.    OBJECTIVE Temp:  [97.7 F (36.5 C)-98 F (36.7 C)] 97.7 F (36.5 C) (03/23 0800) Pulse Rate:  [59-68] 65 (03/23 0800) Cardiac Rhythm:  [-] Normal sinus rhythm (03/23 0148) Resp:  [11-22] 15 (03/23 0526) BP: (123-186)/(67-87) 177/87 mmHg (03/23 0800) SpO2:  [94 %-100 %] 97 % (03/23 0526) Weight:  [80.287 kg (177 lb)] 80.287 kg (177 lb) (03/22 2014)  CBC:   Recent Labs Lab 11/07/15 2228  WBC 8.4  NEUTROABS 5.5  HGB 13.2  HCT 40.4  MCV 85.2  PLT 0000000    Basic Metabolic Panel:   Recent Labs Lab 11/07/15 2228  NA 142  K 3.9  CL 106  CO2 25  GLUCOSE 91  BUN 13  CREATININE 0.88  CALCIUM 9.1    Lipid Panel:     Component Value Date/Time   CHOL 131 11/08/2015 0110   CHOL 153 04/12/2014 1240   TRIG 62 11/08/2015 0110   TRIG 104 04/30/2015 1228   HDL 43 11/08/2015 0110   HDL 53 04/30/2015 1228   HDL 60 04/12/2014 1240   CHOLHDL 3.0 11/08/2015 0110   CHOLHDL 2.6 04/12/2014 1240   VLDL 12 11/08/2015 0110   LDLCALC 76 11/08/2015 0110   LDLCALC 77 04/12/2014 1240   LDLCALC 86 10/20/2013 1338   HgbA1c:  Lab Results  Component Value Date   HGBA1C 5.1 06/15/2014    Urine Drug Screen:     Component Value Date/Time   LABOPIA NONE DETECTED 06/16/2014 0151   COCAINSCRNUR NONE DETECTED 06/16/2014 0151   LABBENZ POSITIVE* 06/16/2014 0151   AMPHETMU NONE DETECTED 06/16/2014 0151   THCU NONE DETECTED 06/16/2014 0151   LABBARB NONE DETECTED 06/16/2014 0151      IMAGING  Mr Brain Wo Contrast (neuro Protocol)  11/07/2015  CLINICAL DATA:  Initial evaluation for acute onset left-sided facial tingling. EXAM: MRI HEAD WITHOUT CONTRAST TECHNIQUE: Multiplanar, multiecho pulse sequences of the brain and surrounding structures were obtained without intravenous contrast. COMPARISON:  Prior MRI from 06/15/2014. FINDINGS: Diffuse prominence of the CSF containing spaces compatible with generalized age-related cerebral atrophy. Patchy T2/FLAIR hyperintensity within the periventricular and deep white matter both cerebral hemispheres most consistent with chronic small vessel ischemic disease. Small perivascular cyst and/or small remote lacunar infarcts within the bilateral basal ganglia. No abnormal foci of restricted diffusion to suggest acute intracranial infarct. Gray-white matter differentiation maintained. Major intracranial vascular flow voids preserved. No acute or chronic intracranial hemorrhage. No mass lesion, midline shift, or mass effect. No hydrocephalus. No extra-axial fluid collection. Craniocervical junction within normal limits. Pituitary gland normal. No acute abnormality about the orbits. Sequela prior lens extraction noted on the right. Paranasal sinuses are clear. Trace opacity within the bilateral mastoid air cells. Inner ear structures grossly normal. Bone marrow  signal intensity within normal limits. No scalp soft tissue abnormality. IMPRESSION: 1. No acute intracranial infarct or other process identified. 2. Stable atrophy with chronic microvascular ischemic disease. Electronically Signed   By: Jeannine Boga M.D.   On: 11/07/2015 23:00   Mr Cindy Duran  Head/brain Wo Cm  11/08/2015  CLINICAL DATA:  Initial evaluation for transient left-sided facial tingling an droop with slurred speech. Now resolved. EXAM: MRA HEAD WITHOUT CONTRAST TECHNIQUE: Angiographic images of the Circle of Willis were obtained using MRA technique without intravenous contrast. COMPARISON:  Prior MRI from 11/07/2015. FINDINGS: ANTERIOR CIRCULATION: Visualized distal cervical segments of the internal carotid arteries are patent with antegrade flow. Petrous, cavernous, and supraclinoid segments widely patent bilaterally. A1 segments, anterior communicating artery, and anterior cerebral arteries well opacified. Mild atheromatous irregularity with multifocal stenoses within the ACA is bilaterally. M1 segments patent without stenosis or occlusion. MCA bifurcations normal. Distal MCA branches symmetric and well opacified bilaterally. POSTERIOR CIRCULATION: Vertebral arteries patent to the vertebrobasilar junction. Left vertebral artery dominant. Posterior inferior cerebral arteries patent bilaterally. Basilar artery widely patent to its distal aspect. Superior cerebral arteries well opacified. Both posterior cerebral arteries arise from the basilar artery and are well opacified to their distal aspects. No aneurysm or vascular malformation. IMPRESSION: 1. No large or proximal arterial branch occlusion within the intracranial circulation. No high-grade or correctable stenosis. 2. Question mild atheromatous irregularity within the anterior cerebral arteries bilaterally with mild multi focal narrowing. Otherwise negative intracranial MRA. No other significant atheromatous disease identified. Electronically Signed   By: Jeannine Boga M.D.   On: 11/08/2015 01:17       PHYSICAL EXAM Pleasant elderly lady not in distress. . Afebrile. Head is nontraumatic. Neck is supple without bruit.    Cardiac exam no murmur or gallop. Lungs are clear to auscultation. Distal pulses are well  felt. Neurological Exam ;  Awake  Alert oriented x 3. Normal speech and language.eye movements full without nystagmus.fundi were not visualized. Vision acuity and fields appear normal. Hearing is normal. Palatal movements are normal. Face symmetric. Tongue midline. Normal strength, tone, reflexes and coordination. Normal sensation. Gait deferred. ASSESSMENT/PLAN Cindy Duran is a 80 y.o. female with history of hypertension, hyperlipidemia and previous transient ischemic attacks presenting with left facial weakness and numbness, speech difficulty and left extremity weakness. She did not receive IV t-PA due to deficits resolved.   R brain TIA  Resultant  Neuro deficits resolved  MRI  No acute infarct  MRA  Essentially unremarkable  Carotid Doppler  pending   2D Echo  pending   LDL 76  HgbA1c pending  Lovenox 40 mg sq daily for VTE prophylaxis Diet Heart Room service appropriate?: Yes; Fluid consistency:: Thin  clopidogrel 75 mg daily prior to admission, continued on clopidogrel 75 mg daily  Patient counseled to be compliant with her antithrombotic medications  Ongoing aggressive stroke risk factor management  Therapy recommendations:  No therapy needs  Disposition:  Return home  Follow up tests above  Hypertension  Stable, 150-170s Permissive hypertension (OK if < 220/120) but gradually normalize in 5-7 days  Hyperlipidemia  Home meds:  lipitor 20, resumed in hospital  LDL 76, goal < 70  Continue statin at discharge  Other Stroke Risk Factors  Advanced age  Hx stroke/TIA  03/2013 TIA transient dysarthria, left hand-face numbness, left face droop. Old stroke on MRI  Family hx stroke (mother, father, maternal aunt)  Migraines  Other Active Problems  Hypothyroid  UTI on Aztreonam  Hospital day #   [place] for Pager information 11/08/2015 10:52 AM  I have personally examined this patient, reviewed notes,  independently viewed imaging studies, participated in medical decision making and plan of care. I have made any additions or clarifications directly to the above note. Agree with note above. She presented with transient left face weakness numbness and speech difficulties likely due to right brain TIA. She remains at risk for neurological worsening, recurrent stroke, TIA needs ongoing stroke evaluation and aggressive risk factor modification.  Antony Contras, MD Medical Director [facility] Pager: [phone] 11/08/2015 2:06 PM    To contact Stroke Continuity provider, please refer to [email or web address] After hours, contact General Neurology

## 2015-11-08 NOTE — Progress Notes (Signed)
Patient arrived from ED around 0130 suspected TIA, positive for UTI able to ambulate from ED stretcher to bed on arrival, family daughters and grand daughter were with her and grand daughter is staying the night. Patient and grand daughter want to be assured that patients DNR status does not preclude any tests that may be needed, I assured them that it would not, will continue to monitor.

## 2015-11-08 NOTE — Care Management Obs Status (Signed)
[place] NOTIFICATION   Patient Details  Name: Cindy Duran MRN: [id number] Date of Birth: Oct 22, 1935   Medicare Observation Status Notification Given:  Yes (MRI results negative)    Pollie Friar, RN 11/08/2015, 1:26 PM

## 2015-11-08 NOTE — Evaluation (Signed)
Physical Therapy Evaluation Patient Details Name: Cindy Duran MRN: [id number] DOB: 09-05-1935 Today's Date: 11/08/2015   History of Present Illness  Pt is a 80 y.o. female with PMH of TIA X 2, migraine headache, hypertension, hyperlipidemia, and hypothyroidism, who presented to the ED with left facial tingling, left facial droop and slurred speech.MRI of her brain was obtained which showed no acute intracranial abnormality  Clinical Impression  PT eval complete. Pt is at baseline level of function. All symptoms have resolved. No follow up PT services or home equipment indicated. PT signing off.    Follow Up Recommendations No PT follow up;Supervision - Intermittent    Equipment Recommendations  None recommended by PT    Recommendations for Other Services       Precautions / Restrictions Precautions Precautions: None      Mobility  Bed Mobility Overal bed mobility: Modified Independent             General bed mobility comments: bed flat, no rail  Transfers Overall transfer level: Modified independent Equipment used: None             General transfer comment: Pt demo safe technique.  Ambulation/Gait Ambulation/Gait assistance: Modified independent (Device/Increase time) Ambulation Distance (Feet): 250 Feet Assistive device: None Gait Pattern/deviations: Step-through pattern;Decreased stride length Gait velocity: mildly decreased   General Gait Details: steady gait noted  Stairs Stairs: Yes Stairs assistance: Supervision Stair Management: One rail Right;Step to pattern;Forwards Number of Stairs: 3    Wheelchair Mobility    Modified Rankin (Stroke Patients Only) Modified Rankin (Stroke Patients Only) Pre-Morbid Rankin Score: No symptoms Modified Rankin: No symptoms     Balance Overall balance assessment: Needs assistance   Sitting balance-Leahy Scale: Normal       Standing balance-Leahy Scale: Good Standing balance comment: Pt reports  occassional LOB during ambulation and expresses interest in cane. No LOB noted during eval. Pt encouraged to reach out to PCP for a cane prescription, if she felt it would make her feel more stable.                             Pertinent Vitals/Pain Pain Assessment: No/denies pain    Home Living Family/patient expects to be discharged to:: Private residence Living Arrangements: Children Available Help at Discharge: Family;Available PRN/intermittently Type of Home: House Home Access: Stairs to enter Entrance Stairs-Rails: Right Entrance Stairs-Number of Steps: 5 Home Layout: One level Home Equipment: None      Prior Function Level of Independence: Independent         Comments: watches granson during the day     Hand Dominance   Dominant Hand: Right    Extremity/Trunk Assessment   Upper Extremity Assessment: RUE deficits/detail RUE Deficits / Details: shoulder ROM deficits from previous surgery. Equal grip strength noted.         Lower Extremity Assessment: Overall WFL for tasks assessed      Cervical / Trunk Assessment: Kyphotic  Communication   Communication: No difficulties  Cognition Arousal/Alertness: Awake/alert Behavior During Therapy: WFL for tasks assessed/performed Overall Cognitive Status: Within Functional Limits for tasks assessed                      General Comments      Exercises        Assessment/Plan    PT Assessment Patent does not need any further PT services  PT Diagnosis Difficulty walking   PT  Problem List    PT Treatment Interventions     PT Goals (Current goals can be found in the Care Plan section) Acute Rehab PT Goals Patient Stated Goal: home PT Goal Formulation: All assessment and education complete, DC therapy    Frequency     Barriers to discharge        Co-evaluation               End of Session Equipment Utilized During Treatment: Gait belt Activity Tolerance: Patient tolerated  treatment well Patient left: in chair;with call bell/phone within reach Nurse Communication: Mobility status    Functional Assessment Tool Used: clinical judgement Functional Limitation: Mobility: Walking and moving around Mobility: Walking and Moving Around Current Status [id number]): At least 1 percent but less than 20 percent impaired, limited or restricted Mobility: Walking and Moving Around Goal Status [phone]): At least 1 percent but less than 20 percent impaired, limited or restricted Mobility: Walking and Moving Around Discharge Status [phone]): At least 1 percent but less than 20 percent impaired, limited or restricted    Time: 1453-1515 PT Time Calculation (min) (ACUTE ONLY): 22 min   Charges:   PT Evaluation $PT Eval Moderate Complexity: 1 Procedure     PT G Codes:   PT G-Codes **NOT FOR INPATIENT CLASS** Functional Assessment Tool Used: clinical judgement Functional Limitation: Mobility: Walking and moving around Mobility: Walking and Moving Around Current Status [id number]): At least 1 percent but less than 20 percent impaired, limited or restricted Mobility: Walking and Moving Around Goal Status [phone]): At least 1 percent but less than 20 percent impaired, limited or restricted Mobility: Walking and Moving Around Discharge Status [phone]): At least 1 percent but less than 20 percent impaired, limited or restricted    Cindy Duran 11/08/2015, 3:56 PM

## 2015-11-08 NOTE — Progress Notes (Signed)
Echocardiogram 2D Echocardiogram has been performed.  Cindy Duran 11/08/2015, 11:04 AM

## 2015-11-09 LAB — HEMOGLOBIN A1C
Hgb A1c MFr Bld: 5.6 % (ref 4.8–5.6)
MEAN PLASMA GLUCOSE: 114 mg/dL

## 2015-11-10 LAB — URINE CULTURE

## 2015-11-15 ENCOUNTER — Encounter: Payer: Self-pay | Admitting: Family Medicine

## 2015-11-15 ENCOUNTER — Ambulatory Visit (INDEPENDENT_AMBULATORY_CARE_PROVIDER_SITE_OTHER): Payer: Medicare Other | Admitting: Family Medicine

## 2015-11-15 VITALS — BP 139/88 | HR 81 | Temp 97.0°F | Ht 66.5 in | Wt 176.0 lb

## 2015-11-15 DIAGNOSIS — Z09 Encounter for follow-up examination after completed treatment for conditions other than malignant neoplasm: Secondary | ICD-10-CM

## 2015-11-15 DIAGNOSIS — R208 Other disturbances of skin sensation: Secondary | ICD-10-CM

## 2015-11-15 DIAGNOSIS — N390 Urinary tract infection, site not specified: Secondary | ICD-10-CM | POA: Diagnosis not present

## 2015-11-15 DIAGNOSIS — I10 Essential (primary) hypertension: Secondary | ICD-10-CM

## 2015-11-15 DIAGNOSIS — Z87440 Personal history of urinary (tract) infections: Secondary | ICD-10-CM

## 2015-11-15 DIAGNOSIS — G459 Transient cerebral ischemic attack, unspecified: Secondary | ICD-10-CM

## 2015-11-15 DIAGNOSIS — M5441 Lumbago with sciatica, right side: Secondary | ICD-10-CM

## 2015-11-15 DIAGNOSIS — R002 Palpitations: Secondary | ICD-10-CM

## 2015-11-15 DIAGNOSIS — R200 Anesthesia of skin: Secondary | ICD-10-CM

## 2015-11-15 LAB — MICROSCOPIC EXAMINATION: EPITHELIAL CELLS (NON RENAL): NONE SEEN /HPF (ref 0–10)

## 2015-11-15 LAB — URINALYSIS, COMPLETE
BILIRUBIN UA: NEGATIVE
Glucose, UA: NEGATIVE
KETONES UA: NEGATIVE
LEUKOCYTES UA: NEGATIVE
NITRITE UA: NEGATIVE
PH UA: 7 (ref 5.0–7.5)
RBC UA: NEGATIVE
UUROB: 0.2 mg/dL (ref 0.2–1.0)

## 2015-11-15 NOTE — Progress Notes (Signed)
Subjective:    Patient ID: Cindy Duran, female    DOB: 1936/07/11, 80 y.o.   MRN: [id number]  HPI Patient here today for hospital follow up. She was seen at [facility] for TIA and UTI.The patient has some left facial weakness and slurred speech the evening before she went to the hospital the next day. Brain scans and MRIs were all negative. She did have a urinary tract infection. There were no other medication changes other than taking Cipro which she has completed. She was sent home from the hospital with a diagnosis of TIA. It was requested in follow-up that we do a urine culture. She She has not had any more episodes of facial weakness or slurred speech since she was discharged from the hospital. She has has episodes of palpitations and lightheadedness and fuzziness like she was going to pass out. She had an EKG and a echocardiogram in the hospital and these were good. She denies any chest pain or increased shortness of breath. Her biggest complaint today is the back pain that she is having what she has had for years radiating to the right side. She's been told she has bone on bone in her vertebrae. It we are hoping to have her taking less pain medicine but she says that she needs more tramadol instead of less. She denies any GI symptoms or urinary tract symptoms. She is alert.     Patient Active Problem List   Diagnosis Date Noted  . UTI (urinary tract infection) 11/08/2015  . Left facial numbness   . Hypothyroidism 11/27/2014  . Allergic rhinitis due to pollen 04/12/2014  . Vitamin D deficiency 04/12/2014  . Other migraine with status migrainosus, not intractable 04/12/2014  . Osteoporosis, senile 11/16/2013  . Osteoarthritis 10/12/2013  . TIA (transient ischemic attack) 04/16/2013  . Hyperlipidemia 04/16/2013  . HYPERTENSION, BENIGN 05/30/2009  . SHORTNESS OF BREATH 05/30/2009   Outpatient Encounter Prescriptions as of 11/15/2015  Medication Sig  . aspirin-acetaminophen-caffeine  (EXCEDRIN MIGRAINE) [id number] MG tablet Take by mouth every 6 (six) hours as needed for headache.  Marland Kitchen atorvastatin (LIPITOR) 20 MG tablet Take 1 tablet (20 mg total) by mouth daily.  . Cholecalciferol (VITAMIN D-3) 5000 UNITS TABS Take 2 capsules by mouth daily.  . clopidogrel (PLAVIX) 75 MG tablet Take 1 tablet (75 mg total) by mouth daily with breakfast.  . SYNTHROID 150 MCG tablet Take 1 tablet (150 mcg total) by mouth daily before breakfast.   No facility-administered encounter medications on file as of 11/15/2015.      Review of Systems  Constitutional: Negative.   Eyes: Negative.   Respiratory: Negative.   Cardiovascular: Negative.   Gastrointestinal: Negative.   Endocrine: Negative.   Genitourinary: Negative.   Musculoskeletal: Positive for back pain.  Skin: Negative.   Allergic/Immunologic: Negative.   Neurological: Positive for headaches.  Hematological: Negative.   Psychiatric/Behavioral: Negative.        Objective:   Physical Exam  Constitutional: She is oriented to person, place, and time. She appears well-developed and well-nourished. She appears distressed.  HENT:  Head: Normocephalic and atraumatic.  No facial weakness and patient is talking without any slurred speech  Eyes: Conjunctivae and EOM are normal. Pupils are equal, round, and reactive to light. Right eye exhibits no discharge. Left eye exhibits no discharge. No scleral icterus.  Neck: Normal range of motion. Neck supple. No thyromegaly present.  Cardiovascular: Normal rate, regular rhythm and normal heart sounds.   No murmur heard. Pulmonary/Chest: Effort  normal and breath sounds normal. No respiratory distress. She has no wheezes. She has no rales.  Abdominal: Soft. Bowel sounds are normal. She exhibits no mass. There is no tenderness. There is no rebound and no guarding.  Musculoskeletal: She exhibits tenderness. She exhibits no edema.  Her range of motion is limited with laying down and sitting up  with increased pain with doing this. Patient is tender in her mid back into the right side.  Lymphadenopathy:    She has no cervical adenopathy.  Neurological: She is alert and oriented to person, place, and time. She has normal reflexes. No cranial nerve deficit.  Skin: Skin is warm and dry. No rash noted.  Psychiatric: She has a normal mood and affect. Her behavior is normal. Judgment and thought content normal.  Nursing note and vitals reviewed.  BP 139/88 mmHg  Pulse 81  Temp(Src) 97 F (36.1 C) (Oral)  Ht 5' 6.5" (1.689 m)  Wt 176 lb (79.833 kg)  BMI 27.98 kg/m2  EKG: Sinus rhythm and old inferior infarct       Assessment & Plan:  1. Recent urinary tract infection -We will call patient with results once these results are available. We will also do a urine culture. - Urinalysis, Complete - Urine culture  2. Hospital discharge follow-up -The patient has had no further episodes of facial weakness or slurred speech. She is not having any urinary tract symptoms. - Urinalysis, Complete - Urine culture  3. Urinary tract infection without hematuria, site unspecified -Recheck urinalysis  4. Transient cerebral ischemia, unspecified transient cerebral ischemia type -We will arrange for a visit with the neurologist and she will stay on her Plavix at the present time  5. HYPERTENSION, BENIGN -Pressure is good today and she will continue with salt restriction.  6. Left facial numbness -No further episodes of this.  7. Midline low back pain with right-sided sciatica -Referred back to neurosurgeon.  8. Palpitations and lightheadedness -[facility] [place] monitor and referral to cardiology  Patient Instructions  We will arrange for you to have a [place] [facility] monitor or cardiac monitor with a follow-up visit with the cardiologist because of these spells of lightheadedness and feeling like to going to pass out We will also arrange for you to have a visit with the neurologist  because of the episode of facial weakness and slurred speech We will also arrange for you to have a visit with the neurosurgeon to see if he can inject your back at the point where you're hurting the most to help control some of the back pain Continue to take the Valium and the Phenergan only if needed and take as little as possible Try to gradually reduce the use of tramadol   Arrie Senate MD

## 2015-11-15 NOTE — Patient Instructions (Addendum)
We will arrange for you to have a [facility] [employer] [facility] monitor or cardiac monitor with a follow-up visit with the cardiologist because of these spells of lightheadedness and feeling like to going to pass out We will also arrange for you to have a visit with the neurologist because of the episode of facial weakness and slurred speech We will also arrange for you to have a visit with the neurosurgeon to see if he can inject your back at the point where you're hurting the most to help control some of the back pain Continue to take the Valium and the Phenergan only if needed and take as little as possible Try to gradually reduce the use of tramadol

## 2015-11-16 LAB — URINE CULTURE: Organism ID, Bacteria: NO GROWTH

## 2015-11-19 ENCOUNTER — Encounter: Payer: Self-pay | Admitting: *Deleted

## 2015-11-20 ENCOUNTER — Other Ambulatory Visit: Payer: Self-pay | Admitting: Family Medicine

## 2015-11-20 DIAGNOSIS — M545 Low back pain, unspecified: Secondary | ICD-10-CM

## 2015-11-22 ENCOUNTER — Other Ambulatory Visit: Payer: Self-pay

## 2015-11-22 ENCOUNTER — Ambulatory Visit: Payer: Self-pay | Admitting: Family Medicine

## 2015-11-22 DIAGNOSIS — M545 Low back pain: Secondary | ICD-10-CM

## 2015-11-27 ENCOUNTER — Ambulatory Visit (HOSPITAL_COMMUNITY): Payer: Medicare Other | Attending: Family Medicine

## 2015-12-03 ENCOUNTER — Ambulatory Visit (INDEPENDENT_AMBULATORY_CARE_PROVIDER_SITE_OTHER): Payer: Medicare Other | Admitting: Neurology

## 2015-12-03 ENCOUNTER — Encounter: Payer: Self-pay | Admitting: Neurology

## 2015-12-03 VITALS — BP 135/80 | HR 74 | Ht 66.5 in | Wt 178.0 lb

## 2015-12-03 DIAGNOSIS — G459 Transient cerebral ischemic attack, unspecified: Secondary | ICD-10-CM

## 2015-12-03 DIAGNOSIS — G43119 Migraine with aura, intractable, without status migrainosus: Secondary | ICD-10-CM | POA: Diagnosis not present

## 2015-12-03 HISTORY — DX: Migraine with aura, intractable, without status migrainosus: G43.119

## 2015-12-03 NOTE — Patient Instructions (Signed)
Stroke Prevention Some medical conditions and behaviors are associated with an increased chance of having a stroke. You may prevent a stroke by making healthy choices and managing medical conditions. HOW CAN I REDUCE MY RISK OF HAVING A STROKE?   Stay physically active. Get at least 30 minutes of activity on most or all days.  Do not smoke. It may also be helpful to avoid exposure to secondhand smoke.  Limit alcohol use. Moderate alcohol use is considered to be:  No more than 2 drinks per day for men.  No more than 1 drink per day for nonpregnant women.  Eat healthy foods. This involves:  Eating 5 or more servings of fruits and vegetables a day.  Making dietary changes that address high blood pressure (hypertension), high cholesterol, diabetes, or obesity.  Manage your cholesterol levels.  Making food choices that are high in fiber and low in saturated fat, trans fat, and cholesterol may control cholesterol levels.  Take any prescribed medicines to control cholesterol as directed by your health care provider.  Manage your diabetes.  Controlling your carbohydrate and sugar intake is recommended to manage diabetes.  Take any prescribed medicines to control diabetes as directed by your health care provider.  Control your hypertension.  Making food choices that are low in salt (sodium), saturated fat, trans fat, and cholesterol is recommended to manage hypertension.  Ask your health care provider if you need treatment to lower your blood pressure. Take any prescribed medicines to control hypertension as directed by your health care provider.  If you are 18-39 years of age, have your blood pressure checked every 3-5 years. If you are 40 years of age or older, have your blood pressure checked every year.  Maintain a healthy weight.  Reducing calorie intake and making food choices that are low in sodium, saturated fat, trans fat, and cholesterol are recommended to manage  weight.  Stop drug abuse.  Avoid taking birth control pills.  Talk to your health care provider about the risks of taking birth control pills if you are over 35 years old, smoke, get migraines, or have ever had a blood clot.  Get evaluated for sleep disorders (sleep apnea).  Talk to your health care provider about getting a sleep evaluation if you snore a lot or have excessive sleepiness.  Take medicines only as directed by your health care provider.  For some people, aspirin or blood thinners (anticoagulants) are helpful in reducing the risk of forming abnormal blood clots that can lead to stroke. If you have the irregular heart rhythm of atrial fibrillation, you should be on a blood thinner unless there is a good reason you cannot take them.  Understand all your medicine instructions.  Make sure that other conditions (such as anemia or atherosclerosis) are addressed. SEEK IMMEDIATE MEDICAL CARE IF:   You have sudden weakness or numbness of the face, arm, or leg, especially on one side of the body.  Your face or eyelid droops to one side.  You have sudden confusion.  You have trouble speaking (aphasia) or understanding.  You have sudden trouble seeing in one or both eyes.  You have sudden trouble walking.  You have dizziness.  You have a loss of balance or coordination.  You have a sudden, severe headache with no known cause.  You have new chest pain or an irregular heartbeat. Any of these symptoms may represent a serious problem that is an emergency. Do not wait to see if the symptoms will   go away. Get medical help at once. Call your local emergency services (911 in [place] Do not drive yourself to the hospital.   This information is not intended to replace advice given to you by your health care provider. Make sure you discuss any questions you have with your health care provider.   Document Released: 09/11/2004 Document Revised: 08/25/2014 Document Reviewed:  02/04/2013 Elsevier Interactive Patient Education 2016 [employer] Inc[employer]  

## 2015-12-03 NOTE — Progress Notes (Signed)
Reason for visit: TIA events  Referring physician: Dr. Yevonne Aline RAMONIA Duran is a 80 y.o. female  History of present illness:  [name] is a 80 year old right-handed white female with a history of hypertension the past. The patient reports 3 separate episodes of left facial numbness, slurred speech, left facial droop that have occurred over the last 3 years. The first event occurred in August 2014, the second event occurred in October 2015, and the third event occurred on 11/07/2015. The patient indicated that she had symptoms for 10 or 15 minutes with full clearing of deficits. The patient reported no increase in her usual headache with the episodes. The patient has daily migraine headaches that she has had "all her life". The patient averages four Excedrin Migraine tablets a day, she will have headaches that start around 4 AM in the morning and wake her up, and then she will have a headache around 3 PM. The patient may have nausea and vomiting with the severe headaches, and she may have a kaleidoscope colors in the vision with the headache. The patient denies that she had any increase in her headache around the time of the TIA-type events. The patient reported no numbness or weakness of the arms or legs. She denied any gait disturbance. There was no confusion with the episodes. The patient has been on Plavix, this medical therapy was not changed. A recent MRI of the brain done in the hospital showed at least a moderate level of small vessel disease, MRA of the head was relatively unremarkable. Carotid Doppler studies were unremarkable, no evidence of a cardiogenic source of emboli was seen on 2-D echocardiogram. The patient has in the past had palpitations of the heart, episodes of near-syncope, but she indicates that coming off the diazepam and Ultram have improved these events.  Past Medical History  Diagnosis Date  . Essential hypertension   . Migraine   . Hypothyroidism   . Hyperlipidemia     . TIA (transient ischemic attack)   . Classical migraine with intractable migraine 12/03/2015    Past Surgical History  Procedure Laterality Date  . Abdominal hysterectomy    . Appendectomy    . Shoulder surgery    . Rotator cuff repair      Family History  Problem Relation Age of Onset  . Stroke Mother   . Alzheimer's disease Mother   . Osteoporosis Mother     Broken hip  . Stroke Father   . Heart attack Brother   . Heart attack Maternal Aunt   . Stroke Maternal Aunt   . Thyroid disease Daughter     Social history:  reports that she has never smoked. She has never used smokeless tobacco. She reports that she does not drink alcohol or use illicit drugs.  Medications:  Prior to Admission medications   Medication Sig Start Date End Date Taking? Authorizing Provider  aspirin-acetaminophen-caffeine (EXCEDRIN MIGRAINE) [phone] MG tablet Take by mouth every 6 (six) hours as needed for headache.   Yes Historical Provider, MD  atorvastatin (LIPITOR) 20 MG tablet Take 1 tablet (20 mg total) by mouth daily. 05/01/15  Yes Chipper Herb, MD  Cholecalciferol (VITAMIN D-3) 5000 UNITS TABS Take 2 capsules by mouth daily.   Yes Historical Provider, MD  clopidogrel (PLAVIX) 75 MG tablet Take 1 tablet (75 mg total) by mouth daily with breakfast. 05/01/15  Yes Chipper Herb, MD  SYNTHROID 150 MCG tablet Take 1 tablet (150 mcg total) by  mouth daily before breakfast. 05/01/15  Yes Chipper Herb, MD      Allergies  Allergen Reactions  . Fioricet [Butalbital-Apap-Caffeine] Other (See Comments)    hallucinations  . Iodine Other (See Comments)    "skin comes off"  . Latex Other (See Comments)    Skin peeling off  . Meloxicam Other (See Comments)    Pt can't remember rxn  . Meperidine Hcl Nausea And Vomiting  . Morphine Nausea And Vomiting  . Other     Clorox Bleach  . Penicillins Hives  . Rosuvastatin Other (See Comments)    Pt can't remember rxn  . Simvastatin Other (See Comments)     Pt can't remember rxn  . Sulfonamide Derivatives Swelling    ROS:  Out of a complete 14 system review of symptoms, the patient complains only of the following symptoms, and all other reviewed systems are negative.  Joint pain, joint swelling, aching muscles Allergies, runny nose, skin sensitivity Headache Restless legs   Blood pressure 135/80, pulse 74, height 5' 6.5" (1.689 m), weight 178 lb (80.74 kg).  Physical Exam  General: The patient is alert and cooperative at the time of the examination.  Eyes: Pupils are equal, round, and reactive to light. Discs are flat bilaterally.  Neck: The neck is supple, no carotid bruits are noted.  Respiratory: The respiratory examination is clear.  Cardiovascular: The cardiovascular examination reveals a regular rate and rhythm, no obvious murmurs or rubs are noted.  Skin: Extremities are without significant edema.  Neurologic Exam  Mental status: The patient is alert and oriented x 3 at the time of the examination. The patient has apparent normal recent and remote memory, with an apparently normal attention span and concentration ability.  Cranial nerves: Facial symmetry is present. There is good sensation of the face to pinprick and soft touch bilaterally. The strength of the facial muscles and the muscles to head turning and shoulder shrug are normal bilaterally. Speech is well enunciated, no aphasia or dysarthria is noted. Extraocular movements are full. Visual fields are full. The tongue is midline, and the patient has symmetric elevation of the soft palate. No obvious hearing deficits are noted.  Motor: The motor testing reveals 5 over 5 strength of all 4 extremities. Good symmetric motor tone is noted throughout.  Sensory: Sensory testing is intact to pinprick, soft touch, vibration sensation, and position sense on all 4 extremities. No evidence of extinction is noted.  Coordination: Cerebellar testing reveals good  finger-nose-finger and heel-to-shin bilaterally.  Gait and station: Gait is slightly wide-based, unsteady. Tandem gait was not attempted. Romberg is negative. No drift is seen.  Reflexes: Deep tendon reflexes are symmetric and normal bilaterally. Toes are downgoing bilaterally.   MRI brain 11/07/15:  IMPRESSION: 1. No acute intracranial infarct or other process identified. 2. Stable atrophy with chronic microvascular ischemic disease.  * MRI scan images were reviewed online. I agree with the written report.     MRA head 11/08/15:  IMPRESSION: 1. No large or proximal arterial branch occlusion within the intracranial circulation. No high-grade or correctable stenosis. 2. Question mild atheromatous irregularity within the anterior cerebral arteries bilaterally with mild multi focal narrowing. Otherwise negative intracranial MRA. No other significant atheromatous disease identified.  * MRA scan images were reviewed online. I agree with the written report.    2D echo 11/08/15:  Study Conclusions  - Left ventricle: The cavity size was normal. Wall thickness was  increased in a pattern of mild LVH.  Systolic function was normal.  The estimated ejection fraction was in the range of 60% to 65%.  Wall motion was normal; there were no regional wall motion  abnormalities. Doppler parameters are consistent with abnormal  left ventricular relaxation (grade 1 diastolic dysfunction). The  E/e&' ratio is >15, suggesting elevated LV filling pressure. - Aortic valve: Trileaflet. Sclerosis without stenosis. There was  trivial regurgitation. - Mitral valve: Mildly thickened leaflets . There was trivial  regurgitation. - Left atrium: Moderately dilated at 47 ml/m2. - Tricuspid valve: There was mild regurgitation. - Pulmonary arteries: PA peak pressure: 29 mm Hg (S). - Inferior vena cava: The vessel was normal in size. The  respirophasic diameter changes were in the normal range  (>= 50%),  consistent with normal central venous pressure.  Impressions:  - Compared to a prior echo in 2014, the LA is now moderate to  severely dilated. LVEF is unchanged at 60-65%.   Assessment/Plan:    1. TIA event  2. Migraine headache, classic migraine  The patient has had 3 identical TIA events over 3 years. The patient likely has a small and vessel involved in producing the above clinical symptoms. It would be unlikely for a cardiogenic event to result in 3 identical TIA events. The episodes do not appear to be associated with her migraine headache. The patient is having early morning headaches, and she may be having a rebound type headache from her Excedrin Migraine. I have recommended that she taper off of this drug and use ibuprofen instead. The patient indicates that there is a family history of DVT, strokes, and heart disease. I will check a hypercoagulable state panel. The patient will follow-up if needed, I will contact her concerning the results of the above. Ongoing antiplatelet therapy and blood pressure management is indicated.   Jill Alexanders MD 12/03/2015 8:01 PM  [facility] [address] [place], [place] [address]  Phone [phone] Fax [phone]

## 2015-12-10 ENCOUNTER — Ambulatory Visit (INDEPENDENT_AMBULATORY_CARE_PROVIDER_SITE_OTHER): Payer: Medicare Other | Admitting: Family Medicine

## 2015-12-10 ENCOUNTER — Encounter: Payer: Self-pay | Admitting: Family Medicine

## 2015-12-10 ENCOUNTER — Encounter (INDEPENDENT_AMBULATORY_CARE_PROVIDER_SITE_OTHER): Payer: Self-pay

## 2015-12-10 VITALS — BP 130/81 | HR 70 | Temp 97.0°F | Ht 66.5 in | Wt 174.0 lb

## 2015-12-10 DIAGNOSIS — M150 Primary generalized (osteo)arthritis: Secondary | ICD-10-CM | POA: Diagnosis not present

## 2015-12-10 DIAGNOSIS — I10 Essential (primary) hypertension: Secondary | ICD-10-CM | POA: Diagnosis not present

## 2015-12-10 DIAGNOSIS — E785 Hyperlipidemia, unspecified: Secondary | ICD-10-CM | POA: Diagnosis not present

## 2015-12-10 DIAGNOSIS — M159 Polyosteoarthritis, unspecified: Secondary | ICD-10-CM

## 2015-12-10 DIAGNOSIS — G459 Transient cerebral ischemic attack, unspecified: Secondary | ICD-10-CM | POA: Diagnosis not present

## 2015-12-10 NOTE — Patient Instructions (Signed)
Continue to follow-up with neurology as planned We will wait and not do any lab work at this visit and wait until the next visit for this. We will be looking for the lab work that he will be sending [place].

## 2015-12-10 NOTE — Progress Notes (Signed)
Subjective:    Patient ID: Cindy Duran, female    DOB: 09-09-1935, 80 y.o.   MRN: [id number]  HPI Patient here today for 4 week follow up on the "light-headed" spells she had been having. She states that since a recent medication change, her back pain and these spells have gotten better.The patient is doing great. She is left off her Phenergan and Valium and tramadol and for some reason she is feeling a lot better and has had no more spells. She says her heart is not cutting up and she does not want to go see Dr. Domenic Polite and does not want to wear the heart monitor unless it starts bothering her again. Her back pain is better and she was to wait on seeing the neurosurgeon. She has seen the neurologist and is waiting on some test results from his office. Dr. Jannifer Franklin review the MRI with her and indicated that she had had several strokes in the past. She is waiting on results of blood work from him. She otherwise denies chest pain shortness of breath trouble swallowing heartburn indigestion nausea vomiting diarrhea or blood in the stool. She is passing her water without problems. She actually looks after her great-grandchild and is getting exercise looking after him. She is in a positive frame of mind.     Patient Active Problem List   Diagnosis Date Noted  . Classical migraine with intractable migraine 12/03/2015  . UTI (urinary tract infection) 11/08/2015  . Left facial numbness   . Hypothyroidism 11/27/2014  . Allergic rhinitis due to pollen 04/12/2014  . Vitamin D deficiency 04/12/2014  . Other migraine with status migrainosus, not intractable 04/12/2014  . Osteoporosis, senile 11/16/2013  . Osteoarthritis 10/12/2013  . TIA (transient ischemic attack) 04/16/2013  . Hyperlipidemia 04/16/2013  . HYPERTENSION, BENIGN 05/30/2009  . SHORTNESS OF BREATH 05/30/2009   Outpatient Encounter Prescriptions as of 12/10/2015  Medication Sig  . aspirin-acetaminophen-caffeine (EXCEDRIN MIGRAINE)  [id number] MG tablet Take by mouth every 6 (six) hours as needed for headache.  Marland Kitchen atorvastatin (LIPITOR) 20 MG tablet Take 1 tablet (20 mg total) by mouth daily.  . Cholecalciferol (VITAMIN D-3) 5000 UNITS TABS Take 2 capsules by mouth daily.  . clopidogrel (PLAVIX) 75 MG tablet Take 1 tablet (75 mg total) by mouth daily with breakfast.  . SYNTHROID 150 MCG tablet Take 1 tablet (150 mcg total) by mouth daily before breakfast.   No facility-administered encounter medications on file as of 12/10/2015.      Review of Systems  Constitutional: Negative.   HENT: Negative.   Eyes: Negative.   Respiratory: Negative.   Cardiovascular: Negative.   Gastrointestinal: Negative.   Endocrine: Negative.   Genitourinary: Negative.   Musculoskeletal: Negative.   Skin: Negative.   Allergic/Immunologic: Negative.   Neurological: Negative.   Hematological: Negative.   Psychiatric/Behavioral: Negative.        Objective:   Physical Exam  Constitutional: She is oriented to person, place, and time. She appears well-developed and well-nourished. No distress.  HENT:  Head: Normocephalic and atraumatic.  Right Ear: External ear normal.  Left Ear: External ear normal.  Nose: Nose normal.  Mouth/Throat: Oropharynx is clear and moist. No oropharyngeal exudate.  Eyes: Conjunctivae and EOM are normal. Pupils are equal, round, and reactive to light. Right eye exhibits no discharge. Left eye exhibits no discharge. No scleral icterus.  Neck: Normal range of motion. Neck supple. No thyromegaly present.  No bruits or thyromegaly or adenopathy  Cardiovascular: Normal rate,  regular rhythm, normal heart sounds and intact distal pulses.   No murmur heard. At 72/m  Pulmonary/Chest: Effort normal and breath sounds normal. No respiratory distress. She has no wheezes. She has no rales. She exhibits no tenderness.  Clear anteriorly and posteriorly  Abdominal: Soft. Bowel sounds are normal. She exhibits no mass. There  is no tenderness. There is no rebound and no guarding.  No liver or spleen enlargement, no bruits and no epigastric or suprapubic tenderness.  Musculoskeletal: Normal range of motion. She exhibits no edema or tenderness.  Lymphadenopathy:    She has no cervical adenopathy.  Neurological: She is alert and oriented to person, place, and time. She has normal reflexes. No cranial nerve deficit.  Skin: Skin is warm and dry. No rash noted.  Psychiatric: She has a normal mood and affect. Her behavior is normal. Judgment and thought content normal.  Nursing note and vitals reviewed.   BP 130/81 mmHg  Pulse 70  Temp(Src) 97 F (36.1 C) (Oral)  Ht 5' 6.5" (1.689 m)  Wt 174 lb (78.926 kg)  BMI 27.67 kg/m2       Assessment & Plan:  1. Transient cerebral ischemia, unspecified transient cerebral ischemia type -No more syncopal spells. She will continue with her Plavix and her Excedrin Migraine as doing -Awaiting blood work results from the neurologist  2. HYPERTENSION, BENIGN -She'll continue to watch her sodium intake  3. Hyperlipidemia -Continue with aggressive therapeutic lifestyle changes and atorvastatin  4. Primary osteoarthritis involving multiple joints -Continue with exercise and Tylenol as needed.  Patient Instructions  Continue to follow-up with neurology as planned We will wait and not do any lab work at this visit and wait until the next visit for this. We will be looking for the lab work that he will be sending [place].   Arrie Senate MD

## 2015-12-12 ENCOUNTER — Ambulatory Visit: Payer: Self-pay | Admitting: Cardiology

## 2015-12-12 LAB — HYPERCOAGULABLE PANEL, COMPREHENSIVE
APTT: 25.6 s
AT III ACT/NOR PPP CHRO: 105 %
Act. Prt C Resist w/FV Defic.: 3 ratio
Anticardiolipin Ab, IgG: <10 [GPL'U]
Beta-2 Glycoprotein I, IgA: 13 SAU
Beta-2 Glycoprotein I, IgG: <10 SGU
Beta-2 Glycoprotein I, IgM: <10 SMU
DRVVT Screen Seconds: 31.6 s
FACTOR VIII ACTIVITY: 188 % — AB
Factor VII Antigen**: 124 %
HEXAGONAL PHOSPHOLIPID NEUTRAL: 14 s — AB
HOMOCYSTEINE: 24.5 umol/L — AB
PROT C AG ACT/NOR PPP IMM: 82 %
PROT S AG ACT/NOR PPP IMM: 71 %
Protein C Ag/FVII Ag Ratio**: 0.7 ratio
Protein S Ag/FVII Ag Ratio**: 0.6 ratio

## 2015-12-13 ENCOUNTER — Telehealth: Payer: Self-pay | Admitting: Neurology

## 2015-12-13 ENCOUNTER — Telehealth: Payer: Self-pay | Admitting: Family Medicine

## 2015-12-13 NOTE — Telephone Encounter (Signed)
FYI from patient

## 2015-12-13 NOTE — Telephone Encounter (Signed)
I called patient. Blood work showed some elevations in the homocystine level, factor VIII activity, and the antiphospholipid antibody was slightly elevated. These tests will need to be rechecked in 6-8 weeks. I will call her to come in.

## 2015-12-13 NOTE — Telephone Encounter (Signed)
He will send [place] a note after he sees you-----keep appointment

## 2015-12-31 ENCOUNTER — Ambulatory Visit: Payer: Self-pay | Admitting: Cardiology

## 2016-01-29 ENCOUNTER — Other Ambulatory Visit (INDEPENDENT_AMBULATORY_CARE_PROVIDER_SITE_OTHER): Payer: Self-pay

## 2016-01-29 ENCOUNTER — Other Ambulatory Visit: Payer: Self-pay | Admitting: *Deleted

## 2016-01-29 DIAGNOSIS — G459 Transient cerebral ischemic attack, unspecified: Secondary | ICD-10-CM

## 2016-01-29 DIAGNOSIS — G43119 Migraine with aura, intractable, without status migrainosus: Secondary | ICD-10-CM | POA: Diagnosis not present

## 2016-01-29 DIAGNOSIS — Z0289 Encounter for other administrative examinations: Secondary | ICD-10-CM

## 2016-01-29 NOTE — Progress Notes (Signed)
Pt here for labwork.  Looking at last phone message per Dr. Jannifer Franklin.  Placed orders.

## 2016-02-02 LAB — COAG STUDIES INTERP REPORT: PDF Image: 0

## 2016-02-02 LAB — ANTIPHOSPHOLIPID SYNDROME EVAL, BLD
APTT PPP: 25.4 s (ref 22.9–30.2)
Beta-2 Glyco I IgG: <9 GPI IgG units (ref 0–20)
DRVVT: 31.6 s (ref 0.0–47.0)
HEXAGONAL PHASE PHOSPHOLIPID: 19 s — AB (ref 0–11)
INR: 1 (ref 0.9–1.1)
PT: 10.5 s (ref 9.6–11.5)
THROMBIN TIME: 21.7 s — AB (ref 0.0–20.9)

## 2016-02-02 LAB — HOMOCYSTEINE: HOMOCYSTEINE: 24.7 umol/L — AB (ref 0.0–15.0)

## 2016-02-02 LAB — FACTOR VIII ANTIGEN: F VIII Antigen: 204 % — ABNORMAL HIGH

## 2016-02-02 LAB — FACTOR 8 ASSAY: FACTOR VIII ACTIVITY: 216 % — AB (ref 57–163)

## 2016-02-07 ENCOUNTER — Telehealth: Payer: Self-pay | Admitting: Neurology

## 2016-02-07 DIAGNOSIS — D6859 Other primary thrombophilia: Secondary | ICD-10-CM

## 2016-02-07 NOTE — Telephone Encounter (Signed)
I called, the number I have for the patient no longer is in service. I called the emergency contact number, left a message. The patient has a lupus anticoagulant antibody that has been persistently elevated. I would recommend a consultation through a hematologist, if they are amenable to this, they are to contact our office.

## 2016-02-11 NOTE — Addendum Note (Signed)
Addended by: Margette Fast on: 02/11/2016 05:39 PM   Modules accepted: Orders

## 2016-02-11 NOTE — Telephone Encounter (Signed)
Lupus anticoagulant, she is amenable to seeing a hematologist concerning this. I will get a revisit set up.

## 2016-02-11 NOTE — Telephone Encounter (Signed)
Patient is calling in to let us know she would like to have an order placed to see a hematologist.  Tuesdays are usually the days she can get a ride(exception, on vacation next week).  Thanks!

## 2016-02-29 ENCOUNTER — Encounter (HOSPITAL_COMMUNITY): Payer: Medicare Other | Attending: Hematology | Admitting: Hematology

## 2016-02-29 ENCOUNTER — Encounter (HOSPITAL_COMMUNITY): Payer: Self-pay | Admitting: Hematology

## 2016-02-29 NOTE — Progress Notes (Deleted)
Marland Kitchen    HEMATOLOGY/ONCOLOGY CONSULTATION NOTE  Date of Service: 02/29/2016  Patient Care Team: Chipper Herb, MD as PCP - General (Family Medicine)  CHIEF COMPLAINTS/PURPOSE OF CONSULTATION:  ***  HISTORY OF PRESENTING ILLNESS:  Cindy Duran is a wonderful 80 y.o. female who has been referred to [place] by Dr Merryl Hacker for evaluation and management of ***  MEDICAL HISTORY:  Past Medical History  Diagnosis Date  . Essential hypertension   . Migraine   . Hypothyroidism   . Hyperlipidemia   . TIA (transient ischemic attack)   . Classical migraine with intractable migraine 12/03/2015    SURGICAL HISTORY: Past Surgical History  Procedure Laterality Date  . Abdominal hysterectomy    . Appendectomy    . Shoulder surgery    . Rotator cuff repair      SOCIAL HISTORY: Social History   Social History  . Marital Status: Widowed    Spouse Name: N/A  . Number of Children: 3  . Years of Education: [employer]   Occupational History  . Retired    Social History Main Topics  . Smoking status: Never Smoker   . Smokeless tobacco: Never Used  . Alcohol Use: No  . Drug Use: No  . Sexual Activity: No   Other Topics Concern  . Not on file   Social History Narrative   Patient lives with daughters.   Right-handed.   Caffeine Use: 1.5 Mountain Dew 12oz daily    FAMILY HISTORY: Family History  Problem Relation Age of Onset  . Stroke Mother   . Alzheimer's disease Mother   . Osteoporosis Mother     Broken hip  . Stroke Father   . Heart attack Brother   . Heart attack Maternal Aunt   . Stroke Maternal Aunt   . Thyroid disease Daughter     ALLERGIES:  is allergic to fioricet; iodine; latex; meloxicam; meperidine hcl; morphine; other; penicillins; rosuvastatin; simvastatin; and sulfonamide derivatives.  MEDICATIONS:  Current Outpatient Prescriptions  Medication Sig Dispense Refill  . aspirin-acetaminophen-caffeine (EXCEDRIN MIGRAINE) 250-250-65 MG tablet Take by mouth every 6  (six) hours as needed for headache.    Marland Kitchen atorvastatin (LIPITOR) 20 MG tablet Take 1 tablet (20 mg total) by mouth daily. 90 tablet 3  . Cholecalciferol (VITAMIN D-3) 5000 UNITS TABS Take 2 capsules by mouth daily.    . clopidogrel (PLAVIX) 75 MG tablet Take 1 tablet (75 mg total) by mouth daily with breakfast. 90 tablet 3  . SYNTHROID 150 MCG tablet Take 1 tablet (150 mcg total) by mouth daily before breakfast. 30 tablet 6   No current facility-administered medications for this visit.    REVIEW OF SYSTEMS:    10 Point review of Systems was done is negative except as noted above.  PHYSICAL EXAMINATION: ECOG PERFORMANCE STATUS: {CHL ONC ECOG [id number]  .There were no vitals filed for this visit. There were no vitals filed for this visit. .There is no weight on file to calculate BMI.  GENERAL:alert, in no acute distress and comfortable SKIN: skin color, texture, turgor are normal, no rashes or significant lesions EYES: normal, conjunctiva are pink and non-injected, sclera clear OROPHARYNX:no exudate, no erythema and lips, buccal mucosa, and tongue normal  NECK: supple, no JVD, thyroid normal size, non-tender, without nodularity LYMPH:  no palpable lymphadenopathy in the cervical, axillary or inguinal LUNGS: clear to auscultation with normal respiratory effort HEART: regular rate & rhythm,  no murmurs and no lower extremity edema ABDOMEN: abdomen soft, non-tender,  normoactive bowel sounds  Musculoskeletal: no cyanosis of digits and no clubbing  PSYCH: alert & oriented x 3 with fluent speech NEURO: no focal motor/sensory deficits  LABORATORY DATA:  I have reviewed the data as listed  . CBC Latest Ref Rng 11/07/2015 04/30/2015 06/15/2014  WBC 4.0 - 10.5 K/uL 8.4 8.0 6.7  Hemoglobin 12.0 - 15.0 g/dL 13.2 - 12.9  Hematocrit 36.0 - 46.0 % 40.4 43.8 39.3  Platelets 150 - 400 K/uL 181 234 189    . CMP Latest Ref Rng 11/07/2015 04/30/2015 10/19/2014  Glucose 65 - 99 mg/dL 91 94 88    BUN 6 - 20 mg/dL 13 18 13   Creatinine 0.44 - 1.00 mg/dL 0.88 0.85 0.80  Sodium 135 - 145 mmol/L 142 146(H) 143  Potassium 3.5 - 5.1 mmol/L 3.9 4.4 4.2  Chloride 101 - 111 mmol/L 106 105 103  CO2 22 - 32 mmol/L 25 26 21   Calcium 8.9 - 10.3 mg/dL 9.1 9.8 9.8  Total Protein 6.0 - 8.5 g/dL - 7.0 -  Total Bilirubin 0.0 - 1.2 mg/dL - 0.4 -  Alkaline Phos 39 - 117 IU/L - 98 -  AST 0 - 40 IU/L - 21 -  ALT 0 - 32 IU/L - 12 -     RADIOGRAPHIC STUDIES: I have personally reviewed the radiological images as listed and agreed with the findings in the report. No results found.  ASSESSMENT & PLAN:  ***  All of the patients questions were answered with apparent satisfaction. The patient knows to call the clinic with any problems, questions or concerns.  I spent {CHL ONC TIME VISIT - [id number] counseling the patient face to face. The total time spent in the appointment was {CHL ONC TIME VISIT - [id number] and more than 50% was on counseling and direct patient cares.    Sullivan Lone MD [place] AAHIVMS [facility] Hematology/Oncology Physician [facility]  (Office):       [phone] (Work cell):  [phone] (Fax):           [phone]  02/29/2016 1:22 PM

## 2016-03-14 ENCOUNTER — Other Ambulatory Visit: Payer: Self-pay | Admitting: Family Medicine

## 2016-05-14 ENCOUNTER — Other Ambulatory Visit: Payer: Self-pay | Admitting: *Deleted

## 2016-05-14 MED ORDER — CLOPIDOGREL BISULFATE 75 MG PO TABS: 75.0000 mg | ORAL_TABLET | Freq: Every day | ORAL | 3 refills | Status: DC

## 2016-05-14 MED ORDER — ATORVASTATIN CALCIUM 20 MG PO TABS: 20.0000 mg | ORAL_TABLET | Freq: Every day | ORAL | 3 refills | Status: DC

## 2016-05-22 DIAGNOSIS — D225 Melanocytic nevi of trunk: Secondary | ICD-10-CM | POA: Diagnosis not present

## 2016-05-22 DIAGNOSIS — L578 Other skin changes due to chronic exposure to nonionizing radiation: Secondary | ICD-10-CM | POA: Diagnosis not present

## 2016-05-22 DIAGNOSIS — L820 Inflamed seborrheic keratosis: Secondary | ICD-10-CM | POA: Diagnosis not present

## 2016-05-22 DIAGNOSIS — L538 Other specified erythematous conditions: Secondary | ICD-10-CM | POA: Diagnosis not present

## 2016-07-03 ENCOUNTER — Ambulatory Visit: Payer: Medicare Other | Admitting: Family Medicine

## 2016-07-15 ENCOUNTER — Ambulatory Visit (INDEPENDENT_AMBULATORY_CARE_PROVIDER_SITE_OTHER): Payer: Medicare Other | Admitting: Family Medicine

## 2016-07-15 ENCOUNTER — Ambulatory Visit (INDEPENDENT_AMBULATORY_CARE_PROVIDER_SITE_OTHER): Payer: Medicare Other

## 2016-07-15 ENCOUNTER — Encounter: Payer: Self-pay | Admitting: Family Medicine

## 2016-07-15 VITALS — BP 130/79 | HR 70 | Temp 96.9°F | Ht 66.5 in | Wt 182.0 lb

## 2016-07-15 DIAGNOSIS — I10 Essential (primary) hypertension: Secondary | ICD-10-CM

## 2016-07-15 DIAGNOSIS — M159 Polyosteoarthritis, unspecified: Secondary | ICD-10-CM

## 2016-07-15 DIAGNOSIS — M150 Primary generalized (osteo)arthritis: Secondary | ICD-10-CM | POA: Diagnosis not present

## 2016-07-15 DIAGNOSIS — R350 Frequency of micturition: Secondary | ICD-10-CM

## 2016-07-15 DIAGNOSIS — E559 Vitamin D deficiency, unspecified: Secondary | ICD-10-CM

## 2016-07-15 DIAGNOSIS — M5440 Lumbago with sciatica, unspecified side: Secondary | ICD-10-CM | POA: Diagnosis not present

## 2016-07-15 DIAGNOSIS — E7800 Pure hypercholesterolemia, unspecified: Secondary | ICD-10-CM

## 2016-07-15 DIAGNOSIS — R6889 Other general symptoms and signs: Secondary | ICD-10-CM | POA: Diagnosis not present

## 2016-07-15 LAB — URINALYSIS, COMPLETE
Bilirubin, UA: NEGATIVE
GLUCOSE, UA: NEGATIVE
Ketones, UA: NEGATIVE
Nitrite, UA: NEGATIVE
PH UA: 5 (ref 5.0–7.5)
PROTEIN UA: NEGATIVE
Specific Gravity, UA: 1.025 (ref 1.005–1.030)
UUROB: 0.2 mg/dL (ref 0.2–1.0)

## 2016-07-15 LAB — MICROSCOPIC EXAMINATION
RBC, UA: >30 /hpf — AB (ref 0–?)
WBC, UA: >30 /hpf — AB (ref 0–?)

## 2016-07-15 NOTE — Progress Notes (Signed)
Subjective:    Patient ID: Cindy Duran, female    DOB: 07/07/1936, 80 y.o.   MRN: [id number]  HPI Pt here for follow up and management of chronic medical problems such as hypertension and hypercholesterolemia. Patient is taking medication as prescribed.The patient today complains of some urinary frequency and occasional dizzy spells along with low back pain. She is due to get a chest x-ray today and return an FOBT. We will also get lab work. She refuses pelvic exam Pap smear and DEXA scan but we will schedule a mammogram. The patient is having more back pain. She comes to the visit today with one of her relatives. She complains of occasional palpitations and has had multiple monitors and nothing was ever found. She thinks is more associated with stress that is secondary to her pain. She denies specifically any chest pain or shortness of breath. She does have rare heartburn indigestion but is not necessarily associated with eating. She denies any blood in the stool or black tarry bowel movements. The problem with passing her water to start a couple weeks ago. She does not drink enough fluids according to patient herself and her caregiver.  Patient Active Problem List   Diagnosis Date Noted  . Classical migraine with intractable migraine 12/03/2015  . UTI (urinary tract infection) 11/08/2015  . Left facial numbness   . Hypothyroidism 11/27/2014  . Allergic rhinitis due to pollen 04/12/2014  . Vitamin D deficiency 04/12/2014  . Other migraine with status migrainosus, not intractable 04/12/2014  . Osteoporosis, senile 11/16/2013  . Osteoarthritis 10/12/2013  . TIA (transient ischemic attack) 04/16/2013  . Hyperlipidemia 04/16/2013  . HYPERTENSION, BENIGN 05/30/2009  . SHORTNESS OF BREATH 05/30/2009   Outpatient Encounter Prescriptions as of 07/15/2016  Medication Sig  . aspirin-acetaminophen-caffeine (EXCEDRIN MIGRAINE) 701-779-39 MG tablet Take by mouth every 6 (six) hours as needed for  headache.  Marland Kitchen atorvastatin (LIPITOR) 20 MG tablet Take 1 tablet (20 mg total) by mouth daily.  . Cholecalciferol (VITAMIN D-3) 5000 UNITS TABS Take 2 capsules by mouth daily.  . clopidogrel (PLAVIX) 75 MG tablet Take 1 tablet (75 mg total) by mouth daily with breakfast.  . SYNTHROID 150 MCG tablet TAKE ONE TABLET BY MOUTH ONCE DAILY BEFORE BREAKFAST   No facility-administered encounter medications on file as of 07/15/2016.          Review of Systems  Constitutional: Negative.   HENT: Negative.   Eyes: Negative.   Respiratory: Negative.   Cardiovascular: Negative.        Dizzy spells   Gastrointestinal: Negative.   Endocrine: Negative.   Genitourinary: Positive for dysuria, frequency and urgency.  Musculoskeletal: Positive for back pain.       Lower back pain    Skin: Negative.   Allergic/Immunologic: Negative.   Neurological: Negative.   Hematological: Negative.   Psychiatric/Behavioral: Negative.        Objective:   Physical Exam  Constitutional: She is oriented to person, place, and time. She appears well-developed and well-nourished. No distress.  HENT:  Head: Normocephalic and atraumatic.  Right Ear: External ear normal.  Left Ear: External ear normal.  Nose: Nose normal.  Mouth/Throat: Oropharynx is clear and moist. No oropharyngeal exudate.  Eyes: Conjunctivae and EOM are normal. Pupils are equal, round, and reactive to light. Right eye exhibits no discharge. Left eye exhibits no discharge. No scleral icterus.  Patient plans to get her eyes checked soon. She will make sure that we get a copy of that  report.  Neck: Normal range of motion. Neck supple. No thyromegaly present.  No bruits thyromegaly or anterior cervical adenopathy  Cardiovascular: Normal rate, regular rhythm, normal heart sounds and intact distal pulses.   No murmur heard. Heart has a regular rate and rhythm at 72/m there are good pedal pulses.  Pulmonary/Chest: Effort normal and breath sounds  normal. No respiratory distress. She has no wheezes. She has no rales. She exhibits no tenderness.  Clear anteriorly and posteriorly  Abdominal: Soft. Bowel sounds are normal. She exhibits no mass. There is no tenderness. There is no rebound and no guarding.  No abdominal tenderness no organ enlargement and no bruits. And specifically no suprapubic tenderness.  Musculoskeletal: Normal range of motion. She exhibits no edema.  The patient definitely has to move slowly because of her ongoing mid low back pain. Reflexes are slightly diminished in the right lower extremity.  Lymphadenopathy:    She has no cervical adenopathy.  Neurological: She is alert and oriented to person, place, and time. No cranial nerve deficit.  Skin: Skin is warm and dry. No rash noted.  Psychiatric: She has a normal mood and affect. Her behavior is normal. Judgment and thought content normal.  Nursing note and vitals reviewed.   BP 130/79 (BP Location: Left Arm, Patient Position: Sitting, Cuff Size: Large)   Pulse 70   Temp (!) 96.9 F (36.1 C) (Oral)   Ht 5' 6.5" (1.689 m)   Wt 182 lb (82.6 kg)   BMI 28.94 kg/m        Assessment & Plan:  1. HYPERTENSION, BENIGN -The blood pressure is good today. She is currently not taking any medication for this. She does have some episodes of low blood pressure and she will try to get some support hose to see if this will stabilize the low blood pressures. - CBC with Differential/Platelet - BMP8+EGFR - Hepatic function panel - DG Chest 2 View; Future  2. Pure hypercholesterolemia -Continue with atorvastatin and as aggressive therapeutic lifestyle changes as possible - CBC with Differential/Platelet - BMP8+EGFR - Lipid panel - DG Chest 2 View; Future  3. Primary osteoarthritis involving multiple joints -Arrange for physical therapy to help especially with her low back pain - CBC with Differential/Platelet - DG Chest 2 View; Future  4. Frequent urination -  Urinalysis, Complete - Urine culture  5. Vitamin D deficiency -Continue current treatment pending results of lab work - CBC with Differential/Platelet - VITAMIN D 25 Hydroxy (Vit-D Deficiency, Fractures)  6. Bilateral low back pain with sciatica, sciatica laterality unspecified, unspecified chronicity -Arrange for physical therapy.  Patient Instructions  We will arrange for the patient have some physical therapy to see if this can help control some of her pain. We are encouraging her to put on some good support hose every morning with arising from bed We will ask her to pick up some Zantac 150 mg, the equate brand take one of these as needed for heartburn and indigestion She will try to drink more water and more fluids We'll give her a prescription for a lift chair We will encourage her to purchase some le croix  Don W. Moore MD   

## 2016-07-15 NOTE — Addendum Note (Signed)
Addended by: Zannie Cove on: 07/15/2016 12:28 PM   Modules accepted: Orders

## 2016-07-15 NOTE — Patient Instructions (Signed)
We will arrange for the patient have some physical therapy to see if this can help control some of her pain. We are encouraging her to put on some good support hose every morning with arising from bed We will ask her to pick up some Zantac 150 mg, the equate brand take one of these as needed for heartburn and indigestion She will try to drink more water and more fluids We'll give her a prescription for a lift chair We will encourage her to purchase some le croix

## 2016-07-15 NOTE — Addendum Note (Signed)
Addended by: Zannie Cove on: 07/15/2016 11:55 AM   Modules accepted: Orders

## 2016-07-16 LAB — CBC WITH DIFFERENTIAL/PLATELET
BASOS: 0 %
Basophils Absolute: 0 10*3/uL (ref 0.0–0.2)
EOS (ABSOLUTE): 0.1 10*3/uL (ref 0.0–0.4)
EOS: 1 %
HEMATOCRIT: 39.9 % (ref 34.0–46.6)
Hemoglobin: 13.2 g/dL (ref 11.1–15.9)
Immature Grans (Abs): 0 10*3/uL (ref 0.0–0.1)
Immature Granulocytes: 0 %
LYMPHS ABS: 2 10*3/uL (ref 0.7–3.1)
Lymphs: 24 %
MCH: 28.9 pg (ref 26.6–33.0)
MCHC: 33.1 g/dL (ref 31.5–35.7)
MCV: 87 fL (ref 79–97)
MONOS ABS: 0.7 10*3/uL (ref 0.1–0.9)
Monocytes: 8 %
Neutrophils Absolute: 5.5 10*3/uL (ref 1.4–7.0)
Neutrophils: 67 %
Platelets: 219 10*3/uL (ref 150–379)
RBC: 4.57 x10E6/uL (ref 3.77–5.28)
RDW: 13.6 % (ref 12.3–15.4)
WBC: 8.3 10*3/uL (ref 3.4–10.8)

## 2016-07-16 LAB — LIPID PANEL
CHOL/HDL RATIO: 2.3 ratio (ref 0.0–4.4)
Cholesterol, Total: 117 mg/dL (ref 100–199)
HDL: 50 mg/dL (ref >39)
LDL Calculated: 48 mg/dL (ref 0–99)
Triglycerides: 95 mg/dL (ref 0–149)
VLDL Cholesterol Cal: 19 mg/dL (ref 5–40)

## 2016-07-16 LAB — BMP8+EGFR
BUN / CREAT RATIO: 30 — AB (ref 12–28)
BUN: 22 mg/dL (ref 8–27)
CO2: 28 mmol/L (ref 18–29)
Calcium: 9.6 mg/dL (ref 8.7–10.3)
Chloride: 104 mmol/L (ref 96–106)
Creatinine, Ser: 0.73 mg/dL (ref 0.57–1.00)
GFR, EST AFRICAN AMERICAN: 91 mL/min/{1.73_m2} (ref >59)
GFR, EST NON AFRICAN AMERICAN: 79 mL/min/{1.73_m2} (ref >59)
Glucose: 94 mg/dL (ref 65–99)
POTASSIUM: 3.9 mmol/L (ref 3.5–5.2)
SODIUM: 146 mmol/L — AB (ref 134–144)

## 2016-07-16 LAB — HEPATIC FUNCTION PANEL
ALT: 10 IU/L (ref 0–32)
AST: 17 IU/L (ref 0–40)
Albumin: 4 g/dL (ref 3.5–4.8)
Alkaline Phosphatase: 88 IU/L (ref 39–117)
BILIRUBIN TOTAL: 0.3 mg/dL (ref 0.0–1.2)
BILIRUBIN, DIRECT: 0.11 mg/dL (ref 0.00–0.40)
TOTAL PROTEIN: 6.4 g/dL (ref 6.0–8.5)

## 2016-07-16 LAB — VITAMIN B12: VITAMIN B 12: 180 pg/mL — AB (ref 211–946)

## 2016-07-16 LAB — VITAMIN D 25 HYDROXY (VIT D DEFICIENCY, FRACTURES): Vit D, 25-Hydroxy: 94 ng/mL (ref 30.0–100.0)

## 2016-07-17 LAB — URINE CULTURE

## 2016-07-17 MED ORDER — CIPROFLOXACIN HCL 500 MG PO TABS: 500.0000 mg | ORAL_TABLET | Freq: Two times a day (BID) | ORAL | 0 refills | Status: DC

## 2016-07-17 NOTE — Addendum Note (Signed)
Addended by: Shelbie Ammons on: 07/17/2016 02:30 PM   Modules accepted: Orders

## 2016-07-23 ENCOUNTER — Telehealth: Payer: Self-pay | Admitting: Family Medicine

## 2016-07-23 ENCOUNTER — Other Ambulatory Visit: Payer: Self-pay | Admitting: *Deleted

## 2016-07-23 DIAGNOSIS — E538 Deficiency of other specified B group vitamins: Secondary | ICD-10-CM

## 2016-07-23 MED ORDER — CYANOCOBALAMIN 1000 MCG/ML IJ SOLN
1000.0000 ug | INTRAMUSCULAR | Status: AC
  Administered 2016-07-24 – 2016-08-14 (×4): 1000 ug via INTRAMUSCULAR

## 2016-07-23 NOTE — Telephone Encounter (Signed)
Pt called and aware of orders and directions

## 2016-07-24 ENCOUNTER — Ambulatory Visit: Payer: Medicare Other

## 2016-07-24 ENCOUNTER — Ambulatory Visit (INDEPENDENT_AMBULATORY_CARE_PROVIDER_SITE_OTHER): Payer: Medicare Other | Admitting: *Deleted

## 2016-07-24 DIAGNOSIS — E538 Deficiency of other specified B group vitamins: Secondary | ICD-10-CM | POA: Diagnosis not present

## 2016-07-24 NOTE — Progress Notes (Signed)
Pt given Vit B12 inj Tolerated well 

## 2016-07-31 ENCOUNTER — Other Ambulatory Visit: Payer: Medicare Other

## 2016-07-31 ENCOUNTER — Ambulatory Visit: Payer: Medicare Other | Attending: Family Medicine | Admitting: Physical Therapy

## 2016-07-31 ENCOUNTER — Ambulatory Visit (INDEPENDENT_AMBULATORY_CARE_PROVIDER_SITE_OTHER): Payer: Medicare Other | Admitting: *Deleted

## 2016-07-31 ENCOUNTER — Encounter: Payer: Self-pay | Admitting: Physical Therapy

## 2016-07-31 DIAGNOSIS — E538 Deficiency of other specified B group vitamins: Secondary | ICD-10-CM | POA: Diagnosis not present

## 2016-07-31 DIAGNOSIS — G8929 Other chronic pain: Secondary | ICD-10-CM | POA: Diagnosis not present

## 2016-07-31 DIAGNOSIS — R293 Abnormal posture: Secondary | ICD-10-CM | POA: Diagnosis not present

## 2016-07-31 DIAGNOSIS — M545 Low back pain: Secondary | ICD-10-CM | POA: Diagnosis not present

## 2016-07-31 NOTE — Progress Notes (Signed)
Pt given Vit B12 inj Tolerated well 

## 2016-07-31 NOTE — Therapy (Addendum)
[facility] [place], [place], [address] Phone: [phone]   Fax:  [phone]  Physical Therapy Treatment  Patient Details  Name: Cindy Duran MRN: [id number] Date of Birth: [birth year] Referring Provider: Redge Gainer MD.  Encounter Date: 07/31/2016      PT End of Session - 07/31/16 1830    Visit Number 1   Number of Visits 12   Date for PT Re-Evaluation 09/29/16   PT Start Time 0315   PT Stop Time 0407   PT Time Calculation (min) 52 min   Activity Tolerance Patient tolerated treatment well   Behavior During Therapy [facility] for tasks assessed/performed      Past Medical History:  Diagnosis Date  . Classical migraine with intractable migraine 12/03/2015  . Essential hypertension   . Hyperlipidemia   . Hypothyroidism   . Migraine   . TIA (transient ischemic attack)     Past Surgical History:  Procedure Laterality Date  . ABDOMINAL HYSTERECTOMY    . APPENDECTOMY    . ROTATOR CUFF REPAIR    . SHOULDER SURGERY      There were no vitals filed for this visit.      Subjective Assessment - 07/31/16 1858    Subjective The patient reports ongoing and worsenening low back pain.  Her resting pain-level is a 4/10 today but within 10 minutes of walking her pain reaches nearly a 10/10 and she has to sit down.  Sitting decreases her pain.            [facility] PT Assessment - 07/31/16 0001      Assessment   Medical Diagnosis Bilateral low back pain with sciatica.   Referring Provider Redge Gainer MD.   Onset Date/Surgical Date --  Many years.     Precautions   Precautions --  OP.  Limited ability to stand due to pain.     Restrictions   Weight Bearing Restrictions No     Balance Screen   Has the patient fallen in the past 6 months No   Has the patient had a decrease in activity level because of a fear of falling?  Yes   Is the patient reluctant to leave their home because of a fear of falling?  No     Home  Environment   Living Environment Private residence     Prior Function   Level of Independence Independent     Posture/Postural Control   Posture/Postural Control Postural limitations   Postural Limitations Rounded Shoulders;Forward head;Decreased lumbar lordosis;Increased thoracic kyphosis     ROM / Strength   AROM / PROM / Strength AROM;Strength     AROM   Overall AROM Comments Right knee extension limited by 10 degrees; bilateral hip flexion= 105 degrees.     Strength   Overall Strength Comments Normal bilateral LE strength.     Palpation   Palpation comment Diffuse palpable bilateral lumbar pain especially over her right QL.     Special Tests    Special Tests Lumbar;Leg LengthTest   Lumbar Tests --  Bil LE DTR's= 1+ to 2+/4+. (-) SLR testing though tight hams     Transfers   Transfers --  Slow and painful from supine to sit.     Ambulation/Gait   Gait Comments The patient is in obvious pain and walks in spinal flexion.                     [place] Adult PT Treatment/Exercise -  Aug 04, 2016 0001      Modalities   Modalities Electrical Stimulation;Moist Heat     Moist Heat Therapy   Number Minutes Moist Heat 15 Minutes   Moist Heat Location Lumbar Spine     Electrical Stimulation   Electrical Stimulation Location Low back.   Electrical Stimulation Action IFC   Electrical Stimulation Parameters 80-150 Hz   Electrical Stimulation Goals Pain                  PT Short Term Goals - 2016-08-04 1838      PT SHORT TERM GOAL #1   Title STG's=LTG's.           PT Long Term Goals - 04-Aug-2016 1839      PT LONG TERM GOAL #1   Title Independent with an HEP.   Time 8   Period Weeks   Status New     PT LONG TERM GOAL #2   Title Stand 20 minutes with pain not > 4/10.   Time 8   Period Weeks   Status New     PT LONG TERM GOAL #3   Title Perform ADL's with pain not > 4/10.   Time 8   Period Weeks   Status New               Plan -  08/04/16 1832    Clinical Impression Statement The patient presents to OPPT with c/o at times severe low back pain especially after walking 10 minutes.  She has a loss of lumbar lordosis and is diffuse tender over her lumbar musculature.  Her high pain-level prohibits her from effectively performing her ADL's.   Rehab Potential Good   PT Frequency 2x / week   PT Duration 6 weeks   PT Treatment/Interventions ADLs/Self Care Home Management;Cryotherapy;Electrical Stimulation;Moist Heat;Ultrasound;Therapeutic activities;Therapeutic exercise;Patient/family education;Manual techniques   PT Next Visit Plan HMP; STW/M and U/S to patient's low back and progression into low-level core exercises.  Can cannot stand long without a lot of pain.   Consulted and Agree with Plan of Care Patient      Patient will benefit from skilled therapeutic intervention in order to improve the following deficits and impairments:  Pain, Decreased activity tolerance, Decreased range of motion, Postural dysfunction  Visit Diagnosis: Chronic bilateral low back pain, with sciatica presence unspecified - Plan: PT plan of care cert/re-cert  Abnormal posture - Plan: PT plan of care cert/re-cert       G-Codes - 08/04/2016 1906    Functional Assessment Tool Used Clinical judgement.   Functional Limitation Mobility: Walking and moving around   Mobility: Walking and Moving Around Current Status [phone]) At least 60 percent but less than 80 percent impaired, limited or restricted   Mobility: Walking and Moving Around Goal Status [phone]) At least 40 percent but less than 60 percent impaired, limited or restricted      Problem List Patient Active Problem List   Diagnosis Date Noted  . Classical migraine with intractable migraine 12/03/2015  . UTI (urinary tract infection) 11/08/2015  . Left facial numbness   . Hypothyroidism 11/27/2014  . Allergic rhinitis due to pollen 04/12/2014  . Vitamin D deficiency 04/12/2014  . Other  migraine with status migrainosus, not intractable 04/12/2014  . Osteoporosis, senile 11/16/2013  . Osteoarthritis 10/12/2013  . TIA (transient ischemic attack) 04/16/2013  . Hyperlipidemia 04/16/2013  . HYPERTENSION, BENIGN 05/30/2009  . SHORTNESS OF BREATH 05/30/2009    APPLEGATE, [place] MPT August 04, 2016, 7:10 PM  [facility] [place], [place], [address] Phone: [phone]   Fax:  [phone]  Name: Cindy Duran MRN: [id number] Date of Birth: 1935-09-03  PHYSICAL THERAPY DISCHARGE SUMMARY  Visits from Start of Care: 1.  Current functional level related to goals / functional outcomes: See above.   Remaining deficits: Patient requesting discharge after one visit as she does not think PT will help.   Education / Equipment: HEP.  Plan: Patient agrees to discharge.  Patient goals were not met. Patient is being discharged due to the patient's request.  ?????         Mali Applegate MPT

## 2016-08-07 ENCOUNTER — Ambulatory Visit (INDEPENDENT_AMBULATORY_CARE_PROVIDER_SITE_OTHER): Payer: Medicare Other | Admitting: *Deleted

## 2016-08-07 ENCOUNTER — Ambulatory Visit: Payer: Medicare Other

## 2016-08-07 ENCOUNTER — Telehealth: Payer: Self-pay | Admitting: *Deleted

## 2016-08-07 DIAGNOSIS — E538 Deficiency of other specified B group vitamins: Secondary | ICD-10-CM | POA: Diagnosis not present

## 2016-08-07 NOTE — Progress Notes (Signed)
Pt given Vit B12 inj Tolerated well 

## 2016-08-12 ENCOUNTER — Ambulatory Visit: Payer: Medicare Other

## 2016-08-12 NOTE — Telephone Encounter (Signed)
FYI

## 2016-08-14 ENCOUNTER — Ambulatory Visit: Payer: Medicare Other

## 2016-08-14 ENCOUNTER — Ambulatory Visit: Payer: Medicare Other | Admitting: Physical Therapy

## 2016-08-14 ENCOUNTER — Ambulatory Visit (INDEPENDENT_AMBULATORY_CARE_PROVIDER_SITE_OTHER): Payer: Medicare Other | Admitting: *Deleted

## 2016-08-14 DIAGNOSIS — E538 Deficiency of other specified B group vitamins: Secondary | ICD-10-CM | POA: Diagnosis not present

## 2016-08-14 DIAGNOSIS — H2512 Age-related nuclear cataract, left eye: Secondary | ICD-10-CM | POA: Diagnosis not present

## 2016-08-14 DIAGNOSIS — Z961 Presence of intraocular lens: Secondary | ICD-10-CM | POA: Diagnosis not present

## 2016-08-14 DIAGNOSIS — H35373 Puckering of macula, bilateral: Secondary | ICD-10-CM | POA: Diagnosis not present

## 2016-08-14 DIAGNOSIS — H40013 Open angle with borderline findings, low risk, bilateral: Secondary | ICD-10-CM | POA: Diagnosis not present

## 2016-08-14 NOTE — Progress Notes (Signed)
Pt given Vit B12 inj Tolerated well 

## 2016-09-11 ENCOUNTER — Ambulatory Visit (INDEPENDENT_AMBULATORY_CARE_PROVIDER_SITE_OTHER): Payer: Medicare Other | Admitting: *Deleted

## 2016-09-11 DIAGNOSIS — E538 Deficiency of other specified B group vitamins: Secondary | ICD-10-CM | POA: Diagnosis not present

## 2016-09-11 MED ORDER — CYANOCOBALAMIN 1000 MCG/ML IJ SOLN
1000.0000 ug | INTRAMUSCULAR | Status: AC
  Administered 2016-09-11 – 2018-07-02 (×12): 1000 ug via INTRAMUSCULAR

## 2016-09-11 NOTE — Progress Notes (Signed)
Pt given Vit B12 inj Tolerated well 

## 2016-10-09 ENCOUNTER — Ambulatory Visit: Payer: Medicare Other

## 2016-11-06 ENCOUNTER — Ambulatory Visit: Payer: Medicare Other

## 2016-11-13 ENCOUNTER — Ambulatory Visit (INDEPENDENT_AMBULATORY_CARE_PROVIDER_SITE_OTHER): Payer: Medicare Other | Admitting: *Deleted

## 2016-11-13 DIAGNOSIS — E538 Deficiency of other specified B group vitamins: Secondary | ICD-10-CM

## 2016-11-13 NOTE — Progress Notes (Signed)
Vitamin b12 injection given and tolerated well.  

## 2016-12-04 DIAGNOSIS — M79676 Pain in unspecified toe(s): Secondary | ICD-10-CM | POA: Diagnosis not present

## 2016-12-04 DIAGNOSIS — B351 Tinea unguium: Secondary | ICD-10-CM | POA: Diagnosis not present

## 2016-12-11 ENCOUNTER — Ambulatory Visit (INDEPENDENT_AMBULATORY_CARE_PROVIDER_SITE_OTHER): Payer: Medicare Other | Admitting: *Deleted

## 2016-12-11 DIAGNOSIS — E538 Deficiency of other specified B group vitamins: Secondary | ICD-10-CM

## 2016-12-11 NOTE — Progress Notes (Signed)
Pt given Vit B12 inj Tolerated well 

## 2017-01-04 NOTE — Progress Notes (Signed)
This encounter was created in error - please disregard.

## 2017-01-08 ENCOUNTER — Ambulatory Visit (INDEPENDENT_AMBULATORY_CARE_PROVIDER_SITE_OTHER): Payer: Medicare Other

## 2017-01-08 DIAGNOSIS — E538 Deficiency of other specified B group vitamins: Secondary | ICD-10-CM | POA: Diagnosis not present

## 2017-01-13 ENCOUNTER — Other Ambulatory Visit: Payer: Self-pay | Admitting: Family Medicine

## 2017-01-14 ENCOUNTER — Ambulatory Visit: Payer: Medicare Other | Admitting: Family Medicine

## 2017-02-12 ENCOUNTER — Ambulatory Visit (INDEPENDENT_AMBULATORY_CARE_PROVIDER_SITE_OTHER): Payer: Medicare Other | Admitting: *Deleted

## 2017-02-12 DIAGNOSIS — E538 Deficiency of other specified B group vitamins: Secondary | ICD-10-CM

## 2017-02-12 NOTE — Progress Notes (Signed)
Pt given vit B12 inj Tolerated well 

## 2017-03-12 ENCOUNTER — Ambulatory Visit: Payer: Medicare Other

## 2017-03-13 ENCOUNTER — Ambulatory Visit (INDEPENDENT_AMBULATORY_CARE_PROVIDER_SITE_OTHER): Payer: Medicare Other | Admitting: *Deleted

## 2017-03-13 DIAGNOSIS — E538 Deficiency of other specified B group vitamins: Secondary | ICD-10-CM | POA: Diagnosis not present

## 2017-03-17 ENCOUNTER — Telehealth: Payer: Self-pay | Admitting: Family Medicine

## 2017-03-17 NOTE — Telephone Encounter (Signed)
Offered pt earlier appt with different providers appt scheduled with [name] per pt request Pt only wanted to see [name]

## 2017-04-08 ENCOUNTER — Encounter: Payer: Self-pay | Admitting: Family Medicine

## 2017-04-08 ENCOUNTER — Ambulatory Visit: Payer: Medicare Other | Admitting: Family Medicine

## 2017-04-08 ENCOUNTER — Ambulatory Visit (INDEPENDENT_AMBULATORY_CARE_PROVIDER_SITE_OTHER): Payer: Medicare Other | Admitting: Family Medicine

## 2017-04-08 VITALS — BP 155/86 | HR 75 | Temp 97.5°F | Ht 66.5 in | Wt 167.0 lb

## 2017-04-08 DIAGNOSIS — E559 Vitamin D deficiency, unspecified: Secondary | ICD-10-CM | POA: Diagnosis not present

## 2017-04-08 DIAGNOSIS — E7800 Pure hypercholesterolemia, unspecified: Secondary | ICD-10-CM

## 2017-04-08 DIAGNOSIS — R55 Syncope and collapse: Secondary | ICD-10-CM

## 2017-04-08 DIAGNOSIS — R002 Palpitations: Secondary | ICD-10-CM | POA: Diagnosis not present

## 2017-04-08 DIAGNOSIS — I10 Essential (primary) hypertension: Secondary | ICD-10-CM

## 2017-04-08 DIAGNOSIS — R001 Bradycardia, unspecified: Secondary | ICD-10-CM

## 2017-04-08 MED ORDER — ATORVASTATIN CALCIUM 20 MG PO TABS: 20.0000 mg | ORAL_TABLET | Freq: Every day | ORAL | 3 refills | Status: DC

## 2017-04-08 MED ORDER — CLOPIDOGREL BISULFATE 75 MG PO TABS: 75.0000 mg | ORAL_TABLET | Freq: Every day | ORAL | 3 refills | Status: DC

## 2017-04-08 MED ORDER — SYNTHROID 150 MCG PO TABS: ORAL_TABLET | ORAL | 3 refills | Status: DC

## 2017-04-08 NOTE — Progress Notes (Signed)
Subjective:    Patient ID: Cindy Duran, female    DOB: 11-26-1935, 81 y.o.   MRN: [id number]  HPI Pt here for follow up and management of chronic medical problems which includes hyperlipidemia and hypertension. She is taking medication regularly.The patient is doing well overall but did complain today of a choking sensation and some palpitations. An EKG that was done was within normal limits. She has had lab work done today and will be given an FOBT to return. A recheck of the blood pressure was 155/86. She is requesting refills on all her medicines. The patient has noticed this choking sensation especially with just swallowing liquids. She also has lightheaded spells but may be sitting or standing when this happens. She has not correlated this with checking her blood pressure. She denies any shortness of breath otherwise. She denies any blood in the stool or black tarry bowel movements. The problem with the swallowing has been getting worse and does not occur every time that she swallows. She passes her water well and other than frequency that is the only complaint she has. The patient arose after the exam and had a spell where she felt lightheaded and her pulse rate a drop down into the 48-56 range from the heart rate is 72 that was earlier. She has seen the cardiologist and he said everything was good but he may not a picked up on this arrhythmia issue that she was having.    Patient Active Problem List   Diagnosis Date Noted  . Classical migraine with intractable migraine 12/03/2015  . UTI (urinary tract infection) 11/08/2015  . Left facial numbness   . Hypothyroidism 11/27/2014  . Allergic rhinitis due to pollen 04/12/2014  . Vitamin D deficiency 04/12/2014  . Other migraine with status migrainosus, not intractable 04/12/2014  . Osteoporosis, senile 11/16/2013  . Osteoarthritis 10/12/2013  . TIA (transient ischemic attack) 04/16/2013  . Hyperlipidemia 04/16/2013  . HYPERTENSION, BENIGN  05/30/2009  . SHORTNESS OF BREATH 05/30/2009   Outpatient Encounter Prescriptions as of 04/08/2017  Medication Sig  . aspirin-acetaminophen-caffeine (EXCEDRIN MIGRAINE) 338-329-19 MG tablet Take by mouth every 6 (six) hours as needed for headache.  . Cholecalciferol (VITAMIN D-3) 5000 UNITS TABS Take 2 capsules by mouth daily.  . clopidogrel (PLAVIX) 75 MG tablet Take 1 tablet (75 mg total) by mouth daily with breakfast.  . SYNTHROID 150 MCG tablet TAKE ONE TABLET BY MOUTH ONCE DAILY BEFORE BREAKFAST  . [DISCONTINUED] ciprofloxacin (CIPRO) 500 MG tablet Take 1 tablet (500 mg total) by mouth 2 (two) times daily. Take with food  . atorvastatin (LIPITOR) 20 MG tablet Take 1 Tablet by mouth once daily (Patient not taking: Reported on 04/08/2017)   Facility-Administered Encounter Medications as of 04/08/2017  Medication  . cyanocobalamin ((VITAMIN B-12)) injection 1,000 mcg      Review of Systems  Constitutional: Negative.   HENT: Negative.   Eyes: Negative.   Respiratory: Negative.   Cardiovascular: Positive for palpitations.       Still has episodes of palpitations, full body sensation of passing out, elevated heartbeat, anxious  Gastrointestinal: Negative.   Endocrine: Negative.   Genitourinary: Negative.        Sensation of food sticking in throat- "will not go down"  Musculoskeletal: Negative.   Skin: Negative.   Allergic/Immunologic: Negative.   Neurological: Negative.   Hematological: Negative.   Psychiatric/Behavioral: The patient is nervous/anxious (with heart / episodes).        Objective:   Physical Exam  Constitutional: She is oriented to person, place, and time. She appears well-developed and well-nourished. No distress.  The patient was calm and alert  HENT:  Head: Normocephalic and atraumatic.  Right Ear: External ear normal.  Left Ear: External ear normal.  Nose: Nose normal.  Mouth/Throat: Oropharynx is clear and moist.  Eyes: Pupils are equal, round, and  reactive to light. Conjunctivae and EOM are normal. Right eye exhibits no discharge. Left eye exhibits no discharge. No scleral icterus.  Neck: Normal range of motion. Neck supple. No thyromegaly present.  No bruits or thyromegaly and no masses and no adenopathy  Cardiovascular: Regular rhythm, normal heart sounds and intact distal pulses.   No murmur heard. Heart rate was 72 initially and upon standing when she was ready to leave the exam room, her  heart rate dropped down to 48/m but regular.  Pulmonary/Chest: Effort normal and breath sounds normal. No respiratory distress. She has no wheezes. She has no rales.  Clear anteriorly and posteriorly no axillary adenopathy  Abdominal: Soft. Bowel sounds are normal. She exhibits no mass. There is no tenderness. There is no rebound and no guarding.  No liver or spleen enlargement no epigastric tenderness no suprapubic tenderness and no masses palpable.  Musculoskeletal: Normal range of motion. She exhibits no edema.  Somewhat hesitant range of motion secondary to chronic back pain  Lymphadenopathy:    She has no cervical adenopathy.  Neurological: She is alert and oriented to person, place, and time. She has normal reflexes. No cranial nerve deficit.  Skin: Skin is warm and dry. No rash noted.  Psychiatric: She has a normal mood and affect. Her behavior is normal. Judgment and thought content normal.  Nursing note and vitals reviewed.   BP (!) 150/85 (BP Location: Left Arm)   Pulse 75   Temp (!) 97.5 F (36.4 C) (Oral)   Ht 5' 6.5" (1.689 m)   Wt 167 lb (75.8 kg)   BMI 26.55 kg/m        Assessment & Plan:  1. HYPERTENSION, BENIGN - theblood pressure iselevated today on 2 occion she ill check blood pressures more closel at home. - BMP8+EGFR - CBC with Differential/Platelet - Hepatic function panel  2. Pure hypercholesterolemia - CBC with Differential/Platelet - Lipid panel  3. Vitamin D deficiency - CBC with  Differential/Platelet - VITAMIN D 25 Hydroxy (Vit-D Deficiency, Fractures)  4. Palpitation - EKg withn normal lmits - CBC with Differential/Platelet - Thyroid Panel With TSH - EKG 12-Lead - Holter monitor - 48 hour; Future  5. Syncope, unspecified syncope type - Holter monitor - 48 hour; Future  6. Bradycardia - check Holer monior  Meds ordered this encounter  Medications  . SYNTHROID 150 MCG tablet    Sig: TAKE ONE TABLET BY MOUTH ONCE DAILY BEFORE BREAKFAST    Dispense:  90 tablet    Refill:  3  . clopidogrel (PLAVIX) 75 MG tablet    Sig: Take 1 tablet (75 mg total) by mouth daily with breakfast.    Dispense:  90 tablet    Refill:  3  . atorvastatin (LIPITOR) 20 MG tablet    Sig: Take 1 tablet (20 mg total) by mouth daily.    Dispense:  90 tablet    Refill:  3   Patient Instructions  Start taking ZANTAC 150 mg (ranitidine) twice daily before breakfast and supper Avoid caffeine  We will arrange a visit with GI - [name] in [place].  we will do a 24-hour  Holter  Arrie Senate MD

## 2017-04-08 NOTE — Patient Instructions (Addendum)
Start taking ZANTAC 150 mg (ranitidine) twice daily before breakfast and supper Avoid caffeine  We will arrange a visit with GI - [name] in [place].  we will do a 24-hour Holter

## 2017-04-09 ENCOUNTER — Ambulatory Visit: Payer: Medicare Other

## 2017-04-09 LAB — THYROID PANEL WITH TSH
Free Thyroxine Index: 4 (ref 1.2–4.9)
T3 Uptake Ratio: 33 % (ref 24–39)
T4 TOTAL: 12.1 ug/dL — AB (ref 4.5–12.0)
TSH: 0.03 u[IU]/mL — ABNORMAL LOW (ref 0.450–4.500)

## 2017-04-09 LAB — CBC WITH DIFFERENTIAL/PLATELET
Basophils Absolute: 0 10*3/uL (ref 0.0–0.2)
Basos: 0 %
EOS (ABSOLUTE): 0.1 10*3/uL (ref 0.0–0.4)
EOS: 1 %
HEMATOCRIT: 42.3 % (ref 34.0–46.6)
Hemoglobin: 14.1 g/dL (ref 11.1–15.9)
IMMATURE GRANS (ABS): 0 10*3/uL (ref 0.0–0.1)
IMMATURE GRANULOCYTES: 0 %
Lymphocytes Absolute: 1.5 10*3/uL (ref 0.7–3.1)
Lymphs: 18 %
MCH: 28.6 pg (ref 26.6–33.0)
MCHC: 33.3 g/dL (ref 31.5–35.7)
MCV: 86 fL (ref 79–97)
Monocytes Absolute: 0.7 10*3/uL (ref 0.1–0.9)
Monocytes: 8 %
Neutrophils Absolute: 6.1 10*3/uL (ref 1.4–7.0)
Neutrophils: 73 %
Platelets: 220 10*3/uL (ref 150–379)
RBC: 4.93 x10E6/uL (ref 3.77–5.28)
RDW: 14.2 % (ref 12.3–15.4)
WBC: 8.3 10*3/uL (ref 3.4–10.8)

## 2017-04-09 LAB — BMP8+EGFR
BUN / CREAT RATIO: 22 (ref 12–28)
BUN: 19 mg/dL (ref 8–27)
CO2: 24 mmol/L (ref 20–29)
CREATININE: 0.86 mg/dL (ref 0.57–1.00)
Calcium: 10.2 mg/dL (ref 8.7–10.3)
Chloride: 103 mmol/L (ref 96–106)
GFR calc Af Amer: 75 mL/min/{1.73_m2} (ref >59)
GFR, EST NON AFRICAN AMERICAN: 65 mL/min/{1.73_m2} (ref >59)
GLUCOSE: 88 mg/dL (ref 65–99)
Potassium: 4.3 mmol/L (ref 3.5–5.2)
Sodium: 145 mmol/L — ABNORMAL HIGH (ref 134–144)

## 2017-04-09 LAB — HEPATIC FUNCTION PANEL
ALBUMIN: 4.1 g/dL (ref 3.5–4.8)
ALK PHOS: 84 IU/L (ref 39–117)
ALT: 16 IU/L (ref 0–32)
AST: 19 IU/L (ref 0–40)
BILIRUBIN, DIRECT: 0.14 mg/dL (ref 0.00–0.40)
Bilirubin Total: 0.4 mg/dL (ref 0.0–1.2)
TOTAL PROTEIN: 6.6 g/dL (ref 6.0–8.5)

## 2017-04-09 LAB — LIPID PANEL
CHOL/HDL RATIO: 2.4 ratio (ref 0.0–4.4)
Cholesterol, Total: 132 mg/dL (ref 100–199)
HDL: 54 mg/dL (ref >39)
LDL CALC: 63 mg/dL (ref 0–99)
TRIGLYCERIDES: 75 mg/dL (ref 0–149)
VLDL CHOLESTEROL CAL: 15 mg/dL (ref 5–40)

## 2017-04-09 LAB — VITAMIN D 25 HYDROXY (VIT D DEFICIENCY, FRACTURES): Vit D, 25-Hydroxy: 97.1 ng/mL (ref 30.0–100.0)

## 2017-04-10 ENCOUNTER — Ambulatory Visit: Payer: Medicare Other | Admitting: Family Medicine

## 2017-04-10 DIAGNOSIS — R002 Palpitations: Secondary | ICD-10-CM | POA: Diagnosis not present

## 2017-04-14 ENCOUNTER — Ambulatory Visit: Payer: Medicare Other

## 2017-04-16 ENCOUNTER — Ambulatory Visit (INDEPENDENT_AMBULATORY_CARE_PROVIDER_SITE_OTHER): Payer: Medicare Other | Admitting: *Deleted

## 2017-04-16 ENCOUNTER — Other Ambulatory Visit: Payer: Self-pay | Admitting: *Deleted

## 2017-04-16 DIAGNOSIS — E538 Deficiency of other specified B group vitamins: Secondary | ICD-10-CM

## 2017-04-16 DIAGNOSIS — R55 Syncope and collapse: Secondary | ICD-10-CM

## 2017-04-16 DIAGNOSIS — R002 Palpitations: Secondary | ICD-10-CM

## 2017-04-16 NOTE — Progress Notes (Signed)
Pt given Cyanocobalamin inj Tolerated well 

## 2017-04-17 NOTE — Progress Notes (Signed)
Pt notified and will come Tuesday at 8:30.

## 2017-04-17 NOTE — Progress Notes (Signed)
Redo for 24 more hours to be complete since it was a 48 hour monitor.

## 2017-04-17 NOTE — Progress Notes (Unsigned)
A 48 hour holter monitor was ordered but patient states she was only supposed to wear it for 24 hours. Do you want [place] to call her to come back in and place the monitor for another 24 hours or do you have enough information already from the first 24 hour reading?

## 2017-04-24 ENCOUNTER — Ambulatory Visit (INDEPENDENT_AMBULATORY_CARE_PROVIDER_SITE_OTHER): Payer: Medicare Other | Admitting: *Deleted

## 2017-04-24 DIAGNOSIS — R001 Bradycardia, unspecified: Secondary | ICD-10-CM

## 2017-04-24 NOTE — Progress Notes (Signed)
Holter monitor placed Serial [id number]

## 2017-04-30 ENCOUNTER — Other Ambulatory Visit: Payer: Medicare Other

## 2017-05-06 ENCOUNTER — Other Ambulatory Visit: Payer: Self-pay | Admitting: *Deleted

## 2017-05-06 DIAGNOSIS — I498 Other specified cardiac arrhythmias: Secondary | ICD-10-CM

## 2017-05-06 DIAGNOSIS — R9431 Abnormal electrocardiogram [ECG] [EKG]: Secondary | ICD-10-CM

## 2017-05-06 DIAGNOSIS — I493 Ventricular premature depolarization: Secondary | ICD-10-CM

## 2017-05-06 NOTE — Progress Notes (Signed)
Pt aware of cardiac monitor and referral placed

## 2017-05-14 ENCOUNTER — Ambulatory Visit (INDEPENDENT_AMBULATORY_CARE_PROVIDER_SITE_OTHER): Payer: Medicare Other | Admitting: *Deleted

## 2017-05-14 ENCOUNTER — Other Ambulatory Visit: Payer: Self-pay | Admitting: *Deleted

## 2017-05-14 DIAGNOSIS — T17308A Unspecified foreign body in larynx causing other injury, initial encounter: Secondary | ICD-10-CM

## 2017-05-14 DIAGNOSIS — E538 Deficiency of other specified B group vitamins: Secondary | ICD-10-CM | POA: Diagnosis not present

## 2017-05-14 DIAGNOSIS — R1310 Dysphagia, unspecified: Secondary | ICD-10-CM

## 2017-05-14 NOTE — Progress Notes (Signed)
Pt given Cyanocobalamin inj Tolerated well 

## 2017-05-19 ENCOUNTER — Encounter (INDEPENDENT_AMBULATORY_CARE_PROVIDER_SITE_OTHER): Payer: Self-pay | Admitting: Internal Medicine

## 2017-05-20 ENCOUNTER — Ambulatory Visit: Payer: Medicare Other | Admitting: Family Medicine

## 2017-06-03 IMAGING — MR MR MRA HEAD W/O CM
1 series · 19 of 48 positions shown · non-contrast
Comparison: Prior MRI from 11/07/2015.

CLINICAL DATA: Initial evaluation for transient left-sided facial
tingling an droop with slurred speech. Now resolved.

EXAM:
MRA HEAD WITHOUT CONTRAST
TECHNIQUE: Angiographic images of the Circle of Willis were obtained using MRA
technique without intravenous contrast.

[Series 3: (id) mt fs · axial · 1.4mm · 0.43mm/px · z∈[-75,+14]mm · 19 of 136 slices shown]
[im 1/136]
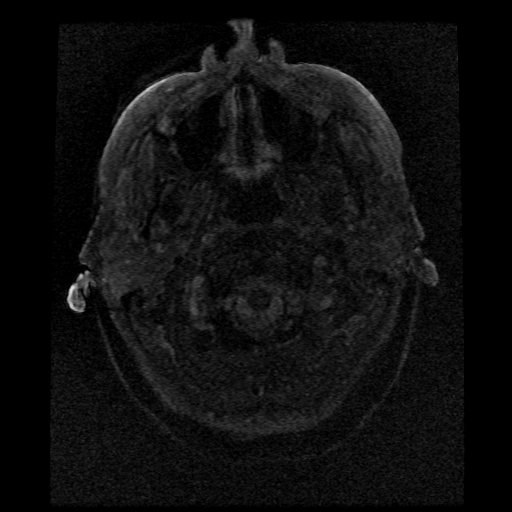
[im 3/136]
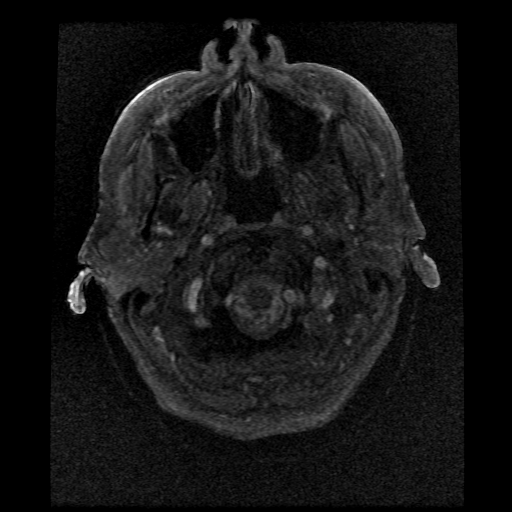
[im 6/136]
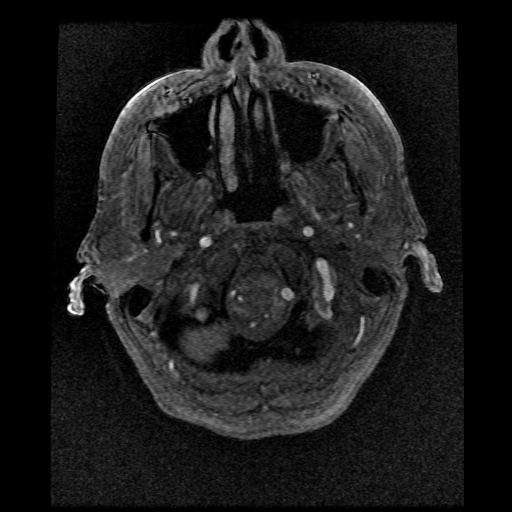
[im 9/136]
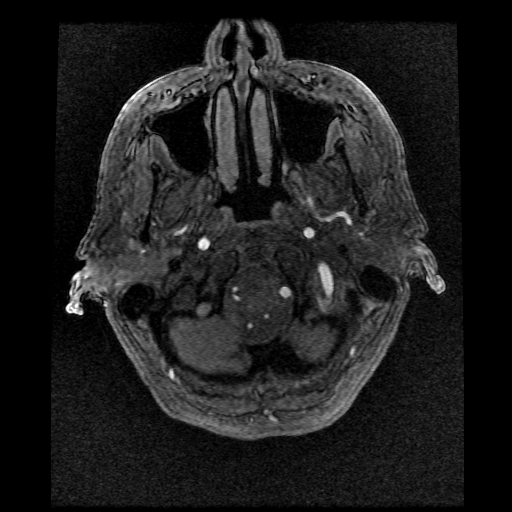
[im 12/136]
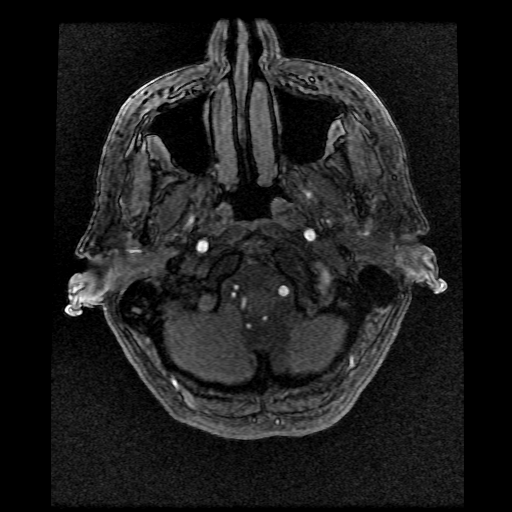
[im 15/136]
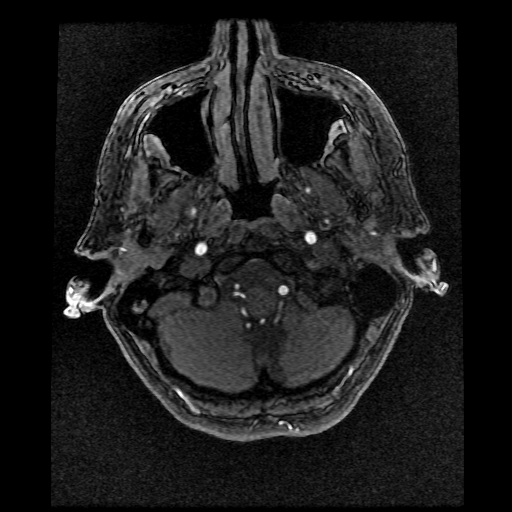
[im 18/136]
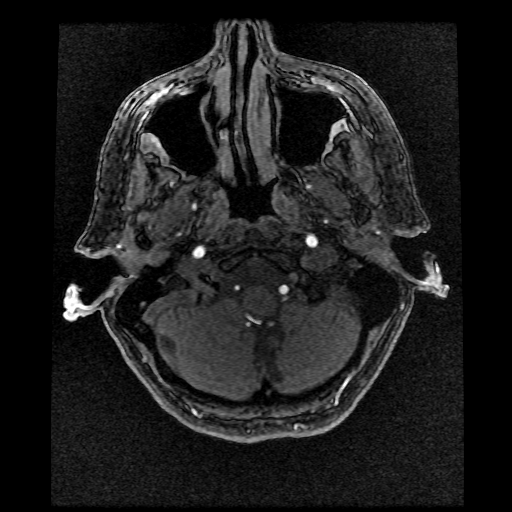
[im 21/136]
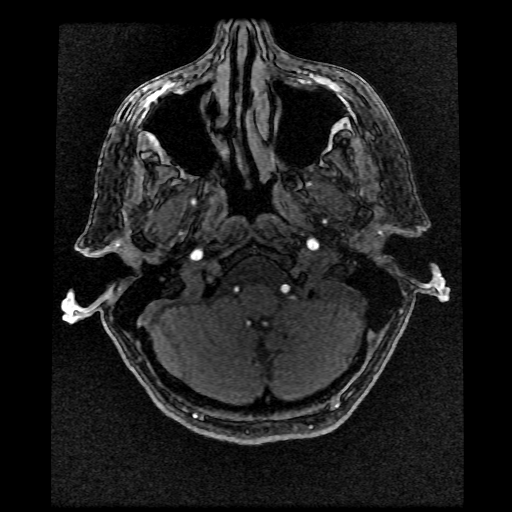
[im 23/136]
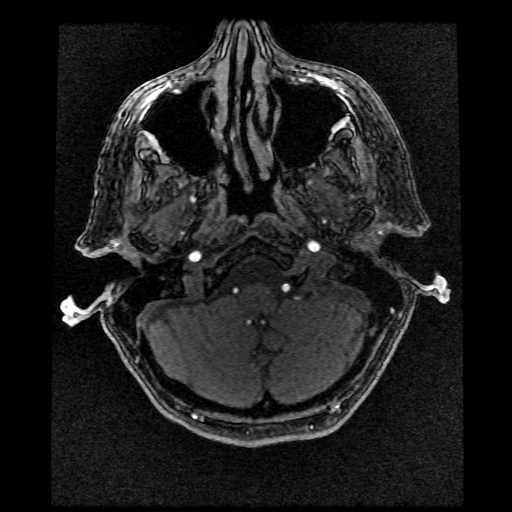
[im 26/136]
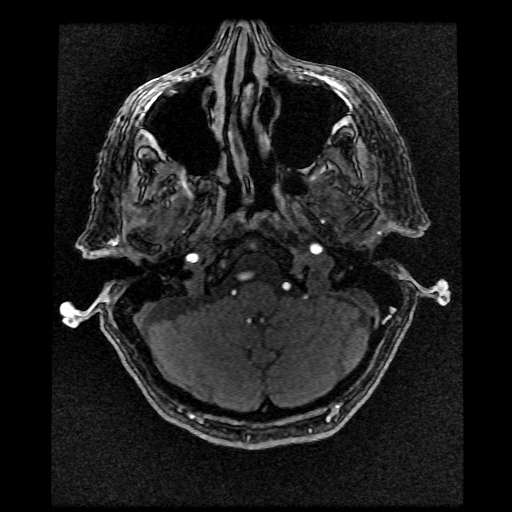
[im 29/136]
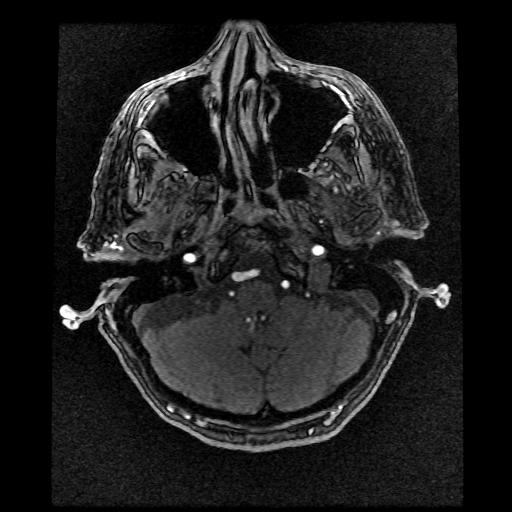
[im 44/136]
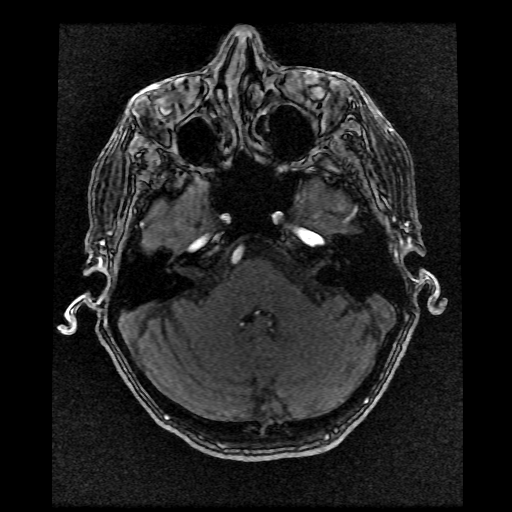
[im 61/136]
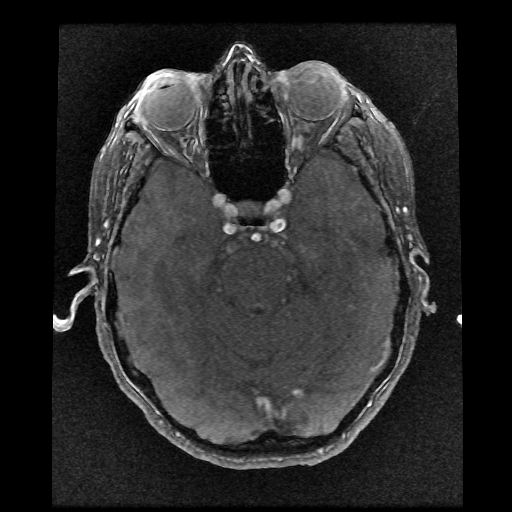
[im 69/136]
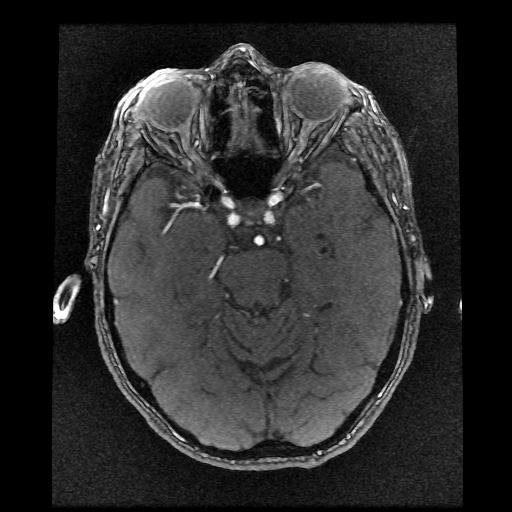
[im 78/136]
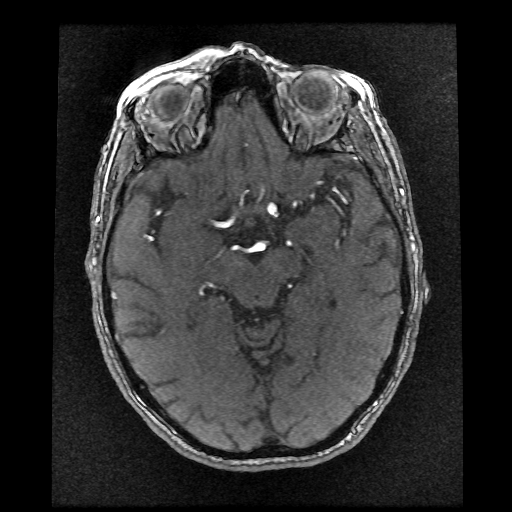
[im 95/136]
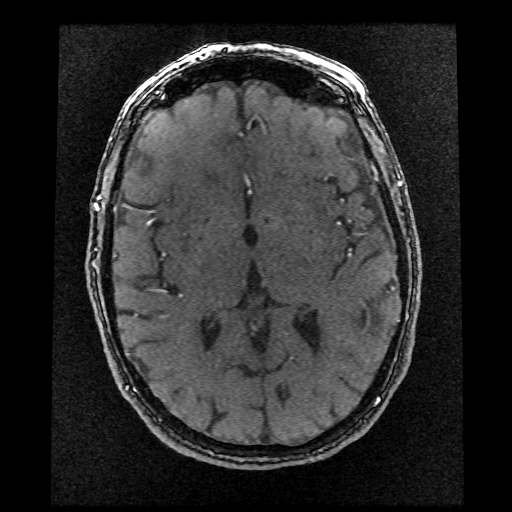
[im 113/136]
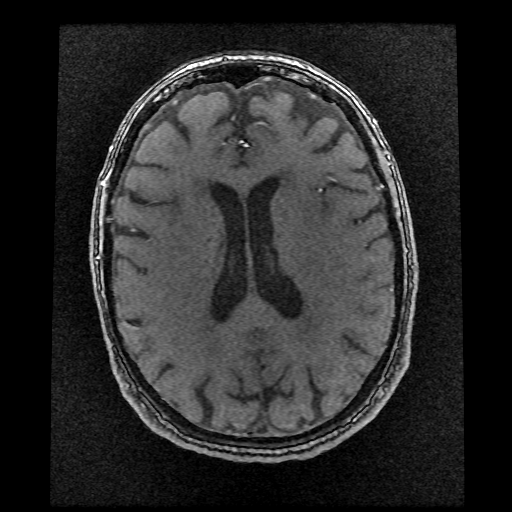
[im 115/136]
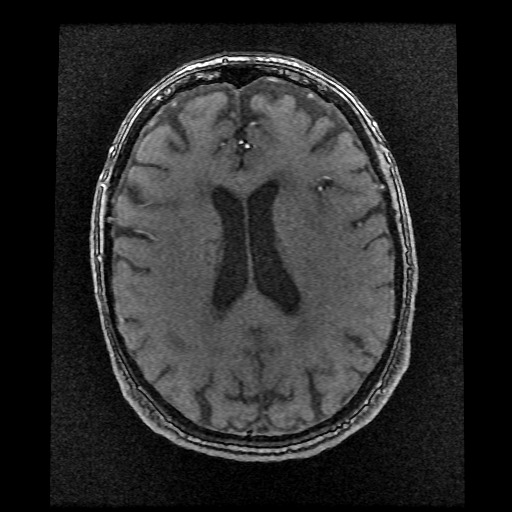
[im 130/136]
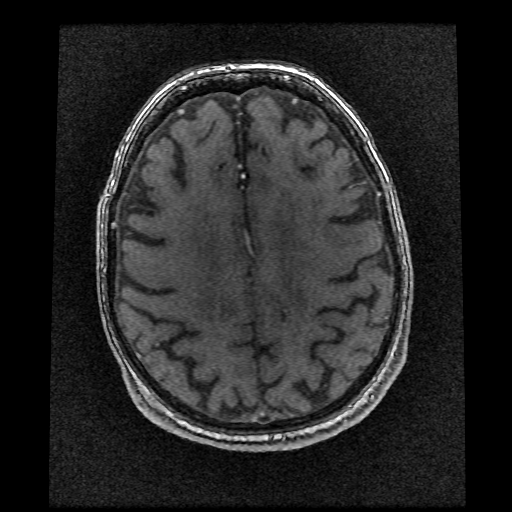

[19 of 48 positions shown; findings below may reference images not displayed]

FINDINGS: ANTERIOR CIRCULATION:

Visualized distal cervical segments of the internal carotid arteries
are patent with antegrade flow. Petrous, cavernous, and supraclinoid
segments widely patent bilaterally. A1 segments, anterior
communicating artery, and anterior cerebral arteries well opacified.
Mild atheromatous irregularity with multifocal stenoses within the
ACA is bilaterally.

M1 segments patent without stenosis or occlusion. MCA bifurcations
normal. Distal MCA branches symmetric and well opacified
bilaterally.

POSTERIOR CIRCULATION:

Vertebral arteries patent to the vertebrobasilar junction. Left
vertebral artery dominant. Posterior inferior cerebral arteries
patent bilaterally. Basilar artery widely patent to its distal
aspect. Superior cerebral arteries well opacified. Both posterior
cerebral arteries arise from the basilar artery and are well
opacified to their distal aspects.

No aneurysm or vascular malformation.
IMPRESSION: 1. No large or proximal arterial branch occlusion within the
intracranial circulation. No high-grade or correctable stenosis.
2. Question mild atheromatous irregularity within the anterior
cerebral arteries bilaterally with mild multi focal narrowing.
Otherwise negative intracranial MRA. No other significant
atheromatous disease identified.

## 2017-06-11 ENCOUNTER — Ambulatory Visit (INDEPENDENT_AMBULATORY_CARE_PROVIDER_SITE_OTHER): Payer: Medicare Other | Admitting: *Deleted

## 2017-06-11 DIAGNOSIS — Z23 Encounter for immunization: Secondary | ICD-10-CM

## 2017-06-11 NOTE — Progress Notes (Signed)
Pt given flu vaccine Tolerated well 

## 2017-06-17 ENCOUNTER — Ambulatory Visit (INDEPENDENT_AMBULATORY_CARE_PROVIDER_SITE_OTHER): Payer: Medicare Other | Admitting: *Deleted

## 2017-06-17 DIAGNOSIS — E538 Deficiency of other specified B group vitamins: Secondary | ICD-10-CM

## 2017-06-17 NOTE — Progress Notes (Signed)
Pt given Cyanocobalamin inj Tolerated well 

## 2017-06-19 ENCOUNTER — Ambulatory Visit (INDEPENDENT_AMBULATORY_CARE_PROVIDER_SITE_OTHER): Payer: Self-pay | Admitting: Internal Medicine

## 2017-06-23 ENCOUNTER — Ambulatory Visit (INDEPENDENT_AMBULATORY_CARE_PROVIDER_SITE_OTHER): Payer: Self-pay | Admitting: Internal Medicine

## 2017-07-23 ENCOUNTER — Encounter: Payer: Self-pay | Admitting: *Deleted

## 2017-07-23 ENCOUNTER — Ambulatory Visit (INDEPENDENT_AMBULATORY_CARE_PROVIDER_SITE_OTHER): Payer: Medicare Other | Admitting: *Deleted

## 2017-07-23 VITALS — BP 146/83 | HR 69 | Ht 64.0 in | Wt 169.0 lb

## 2017-07-23 DIAGNOSIS — E538 Deficiency of other specified B group vitamins: Secondary | ICD-10-CM | POA: Diagnosis not present

## 2017-07-23 DIAGNOSIS — Z0000 Encounter for general adult medical examination without abnormal findings: Secondary | ICD-10-CM

## 2017-07-23 NOTE — Patient Instructions (Signed)
  Ms. Politte , Thank you for taking time to come for your Medicare Wellness Visit. I appreciate your ongoing commitment to your health goals. Please review the following plan we discussed and let me know if I can assist you in the future.   These are the goals we discussed: Move carefully to avoid falls   This is a list of the screening recommended for you and due dates:  Health Maintenance  Topic Date Due  . Pneumonia vaccines (2 of 2 - PPSV23) 09/08/2017*  . Tetanus Vaccine  06/26/2021  . Flu Shot  Completed  . DEXA scan (bone density measurement)  Completed  *Topic was postponed. The date shown is not the original due date.

## 2017-07-24 NOTE — Progress Notes (Signed)
Subjective:   Cindy Duran is a 81 y.o. female who presents for an Initial Medicare Annual Wellness Visit. Cindy Duran is widowed and lives with her daughter and son-in-law. She babysits her 43 year old grandson, Cindy Duran Kitchen, daily and really enjoys the time they spend together. She also enjoys playing cards. She voluntarily stopped driving 4 years ago after having several TIAs.   Review of Systems    Musculoskeletal: chronic back pain. Does not take any medication for the pain. She often has to sit while shopping or doing any activity that involves standing and walking for longer than 10 minutes  Cardiac Risk Factors include: family history of premature cardiovascular disease;hypertension;sedentary lifestyle;advanced age (>5men, >95 women)  Other systems negative today    Objective:    Today's Vitals   07/23/17 0955 07/23/17 1020  BP: (!) 146/83   Pulse: 69   Weight: 169 lb (76.7 kg)   Height: 5\' 4"  (1.626 m)   PainSc:  8    Body mass index is 29.01 kg/m.  Advanced Directives 07/23/2017 07/31/2016 02/29/2016 11/08/2015 11/07/2015 06/15/2014 04/15/2013  Does Patient Have a Medical Advance Directive? Yes No Yes No No No Patient has advance directive, copy not in chart  Type of Advance Directive Living will;Healthcare Power of [place];Living will  Does patient want to make changes to medical advance directive? No - Patient declined - - - - - -  Copy of Cindy Duran in Chart? No - copy requested - - - - - Copy requested from family  Would patient like information on creating a medical advance directive? - - - No - patient declined information No - patient declined information No - patient declined information -    Current Medications (verified) Outpatient Encounter Medications as of 07/23/2017  Medication Sig  . aspirin-acetaminophen-caffeine (EXCEDRIN MIGRAINE) 250-250-65 MG tablet Take by mouth every 6 (six) hours as needed for headache.   Cindy Duran Kitchen atorvastatin (LIPITOR) 20 MG tablet Take 1 tablet (20 mg total) by mouth daily.  . Cholecalciferol (VITAMIN D-3) 5000 UNITS TABS Take 2 capsules by mouth daily.  . clopidogrel (PLAVIX) 75 MG tablet Take 1 tablet (75 mg total) by mouth daily with breakfast.  . SYNTHROID 150 MCG tablet TAKE ONE TABLET BY MOUTH ONCE DAILY BEFORE BREAKFAST   Facility-Administered Encounter Medications as of 07/23/2017  Medication  . cyanocobalamin ((VITAMIN B-12)) injection 1,000 mcg    Allergies (verified) Fioricet [butalbital-apap-caffeine]; Iodine; Latex; Meloxicam; Meperidine hcl; Morphine; Other; Penicillins; Rosuvastatin; Simvastatin; and Sulfonamide derivatives   History: Past Medical History:  Diagnosis Date  . Classical migraine with intractable migraine 12/03/2015  . Essential hypertension   . Hyperlipidemia   . Hypothyroidism   . Migraine   . TIA (transient ischemic attack)    Past Surgical History:  Procedure Laterality Date  . ABDOMINAL HYSTERECTOMY    . APPENDECTOMY    . ROTATOR CUFF REPAIR    . SHOULDER SURGERY     Family History  Problem Relation Age of Onset  . Stroke Mother   . Alzheimer's disease Mother   . Osteoporosis Mother        Broken hip  . Stroke Father   . Heart attack Brother   . Heart attack Maternal Aunt   . Stroke Maternal Aunt   . Thyroid disease Daughter   . Dementia Sister 17   Social History   Socioeconomic History  . Marital status: Widowed    Spouse name:  Not on file  . Number of children: 3  . Years of education: [employer]  . Highest education level: Not on file  Social Needs  . Financial resource strain: Not hard at all  . Food insecurity - worry: Never true  . Food insecurity - inability: Never true  . Transportation needs - medical: No  . Transportation needs - non-medical: No  Occupational History  . 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 Smoking status: Never Smoker  . Smokeless tobacco: Never Used   Substance and Sexual Activity  . Alcohol use: No  . Drug use: No  . Sexual activity: No  Other Topics Concern  . Not on file  Social History Narrative   Patient lives with daughters.   Right-handed.   Caffeine Use: 1.5 Mountain Dew 12oz daily    Clinical Intake:  Pain : 0-10 Pain Score: 8  Pain Type: Chronic pain Pain Location: Back Pain Orientation: Mid, Lower Pain Onset: More than a month ago Pain Frequency: Constant   Activities of Daily Living: Independent Feeding: Independent Dressing/Grooming: Independent Bathing: Independent Toileting: Independent Transfer: Independent Ambulation: Independent Medication Administration: Independent Home Management: Needs assistance (comment)     How often do you need to have someone help you when you read instructions, pamphlets, or other written materials from your doctor or pharmacy?: 1 - Never  Interpreter Needed?: No    Activities of Daily Living In your present state of health, do you have any difficulty performing the following activities: 07/23/2017  Hearing? Y  Comment has some trouble distinguishing words and letters sometimes. No really any trouble with actually the volume of hearing  Vision? N  Difficulty concentrating or making decisions? N  Walking or climbing stairs? Y  Comment due to chronic back pain  Dressing or bathing? N  Doing errands, shopping? N  Comment Has help from daughter's with shopping. She has to stop and rest when she is shopping.   Preparing Food and eating ? N  Using the Toilet? N  In the past six months, have you accidently leaked urine? N  Do you have problems with loss of bowel control? N  Managing your Medications? N  Managing your Finances? N  Housekeeping or managing your Housekeeping? Y  Comment Lives with daughter and her husband. They help out.   Some recent data might be hidden    Immunizations and Health Maintenance Immunization History  Administered Date(s)  Administered  . Influenza, High Dose Seasonal PF 05/22/2016, 06/11/2017  . Influenza,inj,Quad PF,6+ Mos 06/23/2013, 05/17/2014, 05/25/2015  . Pneumococcal Conjugate-13 10/12/2013  . Tdap 06/27/2011   There are no preventive care reminders to display for this patient.  Patient Care Team: Chipper Herb, MD as PCP - General (Family Medicine)  No hospitalizations, ER visits, or surgeries this past year.      Assessment:   This is a routine wellness examination for Cindy Duran.   Hearing/Vision screen No deficits noted during visit  Dietary issues and exercise activities discussed: Current Exercise Habits: The patient does not participate in regular exercise at present, Exercise limited by: orthopedic condition(s)  Goals Move carefully to avoid falls  Depression Screen PHQ 2/9 Scores 07/23/2017 04/08/2017 07/15/2016 12/10/2015 11/15/2015 11/15/2015 04/30/2015  PHQ - 2 Score 0 0 0 0 0 0 0    Fall Risk Fall Risk  07/23/2017 04/08/2017 07/15/2016 12/10/2015 11/15/2015  Falls in the past year? No No No No No  Number falls in past  yr: - - - - -  Injury with Fall? - - - - -    Cognitive Function: MMSE - Mini Mental State Exam 07/23/2017  Orientation to time 5  Orientation to Place 5  Registration 3  Attention/ Calculation 5  Recall 2  Language- name 2 objects 2  Language- repeat 1  Language- follow 3 step command 3  Language- read & follow direction 1  Write a sentence 1  Copy design 1  Total score 29    Screening Tests Health Maintenance  Topic Date Due  . PNA vac Low Risk Adult (2 of 2 - PPSV23) 09/08/2017 (Originally 10/12/2014)  . TETANUS/TDAP  06/26/2021  . INFLUENZA VACCINE  Completed  . DEXA SCAN  Completed     Plan:   Move carefully to avoid falls Appt scheduled with PCP for next week to discuss treatment options for chronic back pain that affects her daily life Also need to order a walker with a seat at that visit   I have personally reviewed and noted the following  in the patient's chart:   . Medical and social history . Use of alcohol, tobacco or illicit drugs  . Current medications and supplements . Functional ability and status . Nutritional status . Physical activity . Advanced directives . List of other physicians . Hospitalizations, surgeries, and ER visits in previous 12 months . Vitals . Screenings to include cognitive, depression, and falls . Referrals and appointments  In addition, I have reviewed and discussed with patient certain preventive protocols, quality metrics, and best practice recommendations. A written personalized care plan for preventive services as well as general preventive health recommendations were provided to patient.     Chong Sicilian, RN   07/24/2017   I have reviewed and agree with the above AWV documentation.   Mary-Margaret Hassell Done, FNP

## 2017-07-29 ENCOUNTER — Ambulatory Visit: Payer: Medicare Other | Admitting: Family Medicine

## 2017-09-16 ENCOUNTER — Telehealth: Payer: Self-pay | Admitting: Family Medicine

## 2017-09-16 MED ORDER — POLYMYXIN B-TRIMETHOPRIM 10000-0.1 UNIT/ML-% OP SOLN: 2.0000 [drp] | OPHTHALMIC | 0 refills | Status: DC

## 2017-09-16 NOTE — Telephone Encounter (Signed)
Patient aware.

## 2017-09-16 NOTE — Telephone Encounter (Signed)
polytrim rx sent to pharmacy

## 2017-10-09 ENCOUNTER — Ambulatory Visit: Payer: Medicare Other

## 2017-10-09 ENCOUNTER — Telehealth: Payer: Self-pay | Admitting: Family Medicine

## 2017-10-09 ENCOUNTER — Ambulatory Visit (INDEPENDENT_AMBULATORY_CARE_PROVIDER_SITE_OTHER): Payer: Medicare Other | Admitting: Family Medicine

## 2017-10-09 ENCOUNTER — Encounter: Payer: Self-pay | Admitting: Family Medicine

## 2017-10-09 VITALS — BP 130/77 | HR 72 | Temp 97.0°F | Ht 64.0 in | Wt 166.0 lb

## 2017-10-09 DIAGNOSIS — E7800 Pure hypercholesterolemia, unspecified: Secondary | ICD-10-CM

## 2017-10-09 DIAGNOSIS — R54 Age-related physical debility: Secondary | ICD-10-CM

## 2017-10-09 DIAGNOSIS — E559 Vitamin D deficiency, unspecified: Secondary | ICD-10-CM

## 2017-10-09 DIAGNOSIS — I10 Essential (primary) hypertension: Secondary | ICD-10-CM

## 2017-10-09 DIAGNOSIS — R2681 Unsteadiness on feet: Secondary | ICD-10-CM | POA: Diagnosis not present

## 2017-10-09 DIAGNOSIS — L509 Urticaria, unspecified: Secondary | ICD-10-CM

## 2017-10-09 DIAGNOSIS — M159 Polyosteoarthritis, unspecified: Secondary | ICD-10-CM

## 2017-10-09 DIAGNOSIS — R55 Syncope and collapse: Secondary | ICD-10-CM | POA: Diagnosis not present

## 2017-10-09 DIAGNOSIS — M150 Primary generalized (osteo)arthritis: Secondary | ICD-10-CM

## 2017-10-09 DIAGNOSIS — E538 Deficiency of other specified B group vitamins: Secondary | ICD-10-CM | POA: Diagnosis not present

## 2017-10-09 NOTE — Progress Notes (Signed)
Subjective:    Patient ID: Cindy Duran, female    DOB: 1936-08-02, 82 y.o.   MRN: [id number]  HPI Pt here for follow up and management of chronic medical problems which includes hypertension and hyperlipidemia. She is taking medication regularly.  The patient continues to have syncopal spells.  She also complains of back pain and an itching sensation all over her body at times.  She is due to return in FOBT and get lab work.  She is in need of using a rolling walker with a seat.  The patient denies any chest pain or shortness of breath anymore than usual.  With severe pain she has increased shortness of breath.  She denies any trouble with her stomach including nausea vomiting diarrhea blood in the stool or black tarry bowel movements.  She does have urinary frequency but no burning or pain with passing her water.  She also has insomnia because she has to get up and go to the bathroom.  She has a lot of allergies and has occasional welts and itching sometimes all over her body at times.  This is been worked up on multiple occasions and no specific treatment has been recommended by the specialist.  She is allergic to many medications as well as to insects and most flowers.  She has tried some Benadryl periodically for this when the itching and whelping is severe.  Her back pain is chronic and severe at all times.  Is mostly in the upper lumbar spine area.  She also has syncopal spells and uses the wall for support and getting around the home.  She also uses people to support her so she does not fall.  The pain though in her back is severe and she says she has fallen multiple times but is able to catch herself while holding onto the wall versus falling actually down in the floor.  This patient has seen multiple specialist and had physical therapy and none of this has been able to help her ongoing back pain.  She is seeing the neurosurgeon the orthopedic surgeon and physical therapy seem to make things worse  rather than better.     Patient Active Problem List   Diagnosis Date Noted  . Classical migraine with intractable migraine 12/03/2015  . UTI (urinary tract infection) 11/08/2015  . Left facial numbness   . Hypothyroidism 11/27/2014  . Allergic rhinitis due to pollen 04/12/2014  . Vitamin D deficiency 04/12/2014  . Other migraine with status migrainosus, not intractable 04/12/2014  . Osteoporosis, senile 11/16/2013  . Osteoarthritis 10/12/2013  . TIA (transient ischemic attack) 04/16/2013  . Hyperlipidemia 04/16/2013  . HYPERTENSION, BENIGN 05/30/2009  . SHORTNESS OF BREATH 05/30/2009   Outpatient Encounter Medications as of 10/09/2017  Medication Sig  . aspirin-acetaminophen-caffeine (EXCEDRIN MIGRAINE) [phone] MG tablet Take by mouth every 6 (six) hours as needed for headache.  Marland Kitchen atorvastatin (LIPITOR) 20 MG tablet Take 1 tablet (20 mg total) by mouth daily.  . Cholecalciferol (VITAMIN D-3) 5000 UNITS TABS Take 2 capsules by mouth daily.  . clopidogrel (PLAVIX) 75 MG tablet Take 1 tablet (75 mg total) by mouth daily with breakfast.  . SYNTHROID 150 MCG tablet TAKE ONE TABLET BY MOUTH ONCE DAILY BEFORE BREAKFAST  . [DISCONTINUED] trimethoprim-polymyxin b (POLYTRIM) ophthalmic solution Place 2 drops into both eyes every 4 (four) hours.   Facility-Administered Encounter Medications as of 10/09/2017  Medication  . cyanocobalamin ((VITAMIN B-12)) injection 1,000 mcg      Review of  Systems  Constitutional: Negative.   HENT: Negative.   Eyes: Negative.   Respiratory: Negative.   Cardiovascular: Negative.   Gastrointestinal: Negative.   Endocrine: Negative.   Genitourinary: Negative.   Musculoskeletal: Positive for back pain.  Skin: Positive for rash (itching at times - all over - comes and goes).  Allergic/Immunologic: Negative.   Neurological: Positive for syncope ("spells").  Hematological: Negative.   Psychiatric/Behavioral: Negative.        Objective:   Physical  Exam  Constitutional: She is oriented to person, place, and time. She appears well-developed and well-nourished. No distress.  Patient is pleasant and relaxed and comes to the visit today with her granddaughter.  HENT:  Head: Normocephalic and atraumatic.  Right Ear: External ear normal.  Left Ear: External ear normal.  Nose: Nose normal.  Mouth/Throat: Oropharynx is clear and moist. No oropharyngeal exudate.  Eyes: Conjunctivae and EOM are normal. Pupils are equal, round, and reactive to light. Right eye exhibits no discharge. Left eye exhibits no discharge. No scleral icterus.  Neck: Normal range of motion. Neck supple. No thyromegaly present.  No bruits thyromegaly or anterior cervical adenopathy  Cardiovascular: Normal rate, regular rhythm, normal heart sounds and intact distal pulses.  No murmur heard. Heart is 72/min with a regular rate and rhythm  Pulmonary/Chest: Effort normal and breath sounds normal. No respiratory distress. She has no wheezes. She has no rales.  Clear anteriorly and posteriorly  Abdominal: Soft. Bowel sounds are normal. She exhibits no mass. There is no tenderness. There is no rebound and no guarding.  No abdominal tenderness masses bruits or organ enlargement  Musculoskeletal: She exhibits no edema.  The patient has cautious and hesitant movement to maintain stability to keep from falling.  Leg raising is good with some discomfort on the right in the groin area with leg raising.  Back is tender to palpation just to the right of center in the lumbar area.  She has a somewhat kyphotic posture.  Lymphadenopathy:    She has no cervical adenopathy.  Neurological: She is alert and oriented to person, place, and time. No cranial nerve deficit.  Reflexes in the lower extremity are diminished compared to those on the left.  Skin: Skin is warm and dry. No rash noted.  Psychiatric: She has a normal mood and affect. Her behavior is normal. Judgment and thought content  normal.  Nursing note and vitals reviewed.   BP 130/77 (BP Location: Right Arm)   Pulse 72   Temp (!) 97 F (36.1 C) (Oral)   Ht '5\' 4"'  (1.626 m)   Wt 166 lb (75.3 kg)   SpO2 97%   BMI 28.49 kg/m        Assessment & Plan:  1. HYPERTENSION, BENIGN -The blood pressure is good today and she will continue with current treatment - BMP8+EGFR - CBC with Differential/Platelet - Hepatic function panel  2. Pure hypercholesterolemia -Continue with aggressive therapeutic lifestyle changes and atorvastatin pending results of lab work - CBC with Differential/Platelet - Lipid panel  3. Vitamin D deficiency -Continue with current treatment pending results of lab work - CBC with Differential/Platelet - VITAMIN D 25 Hydroxy (Vit-D Deficiency, Fractures)  4. B12 deficiency - CBC with Differential/Platelet  5. Syncope, unspecified syncope type -Provide stability for walking and movement with a rolling walker with a seat - CBC with Differential/Platelet  6. Gait instability -With this gait instability and hesitant movement the patient has so far avoided actually falling to the floor.  She  uses the wall and people to support her when she is moving.  She needs a rolling walker with a seat to provide stability.  7. Frailty -The back pain and gait instability certainly substantiate the need for a rolling walker -Physical therapy has been tried and this has not helped with her stability.  8. Primary osteoarthritis involving multiple joints -The back pain is most likely secondary to all the arthritis in her back and involving multiple joints.  9. Urticaria of entire body The patient has a known history of many allergies both to the external environment as well as drugs and is not tolerable of many medications at all.  Patient Instructions                       Medicare Annual Wellness Visit  [facility] and the medical providers at [facility] strive to bring you  the best medical care.  In doing so we not only want to address your current medical conditions and concerns but also to detect new conditions early and prevent illness, disease and health-related problems.    Medicare offers a yearly Wellness Visit which allows our clinical staff to assess your need for preventative services including immunizations, lifestyle education, counseling to decrease risk of preventable diseases and screening for fall risk and other medical concerns.    This visit is provided free of charge (no copay) for all Medicare recipients. The clinical pharmacists at [facility] have begun to conduct these Wellness Visits which will also include a thorough review of all your medications.    As you primary medical provider recommend that you make an appointment for your Annual Wellness Visit if you have not done so already this year.  You may set up this appointment before you leave today or you may call back ([phone]) and schedule an appointment.  Please make sure when you call that you mention that you are scheduling your Annual Wellness Visit with the clinical pharmacist so that the appointment may be made for the proper length of time.     Continue current medications. Continue good therapeutic lifestyle changes which include good diet and exercise. Fall precautions discussed with patient. If an FOBT was given today- please return it to our front desk. If you are over 56 years old - you may need Prevnar 41 or the adult Pneumonia vaccine.  **Flu shots are available--- please call and schedule a FLU-CLINIC appointment**  After your visit with [place] today you will receive a survey in the mail or online from [employer] regarding your care with [place]. Please take a moment to fill this out. Your feedback is very important to [place] as you can help [place] better understand your patient needs as well as improve your experience and satisfaction. WE CARE ABOUT YOU!!!   We will  give you a prescription for a rolling walker with a seat so that you have some type of support to keep you from falling and to help stabilize your gait instability.    Arrie Senate MD   .

## 2017-10-09 NOTE — Patient Instructions (Addendum)
Medicare Annual Wellness Visit  [facility] and the medical providers at [facility] strive to bring you the best medical care.  In doing so we not only want to address your current medical conditions and concerns but also to detect new conditions early and prevent illness, disease and health-related problems.    Medicare offers a yearly Wellness Visit which allows our clinical staff to assess your need for preventative services including immunizations, lifestyle education, counseling to decrease risk of preventable diseases and screening for fall risk and other medical concerns.    This visit is provided free of charge (no copay) for all Medicare recipients. The clinical pharmacists at [facility] have begun to conduct these Wellness Visits which will also include a thorough review of all your medications.    As you primary medical provider recommend that you make an appointment for your Annual Wellness Visit if you have not done so already this year.  You may set up this appointment before you leave today or you may call back ([phone]) and schedule an appointment.  Please make sure when you call that you mention that you are scheduling your Annual Wellness Visit with the clinical pharmacist so that the appointment may be made for the proper length of time.     Continue current medications. Continue good therapeutic lifestyle changes which include good diet and exercise. Fall precautions discussed with patient. If an FOBT was given today- please return it to our front desk. If you are over 80 years old - you may need Prevnar 30 or the adult Pneumonia vaccine.  **Flu shots are available--- please call and schedule a FLU-CLINIC appointment**  After your visit with [place] today you will receive a survey in the mail or online from [employer] regarding your care with [place]. Please take a moment to fill this out. Your feedback is very  important to [place] as you can help [place] better understand your patient needs as well as improve your experience and satisfaction. WE CARE ABOUT YOU!!!   We will give you a prescription for a rolling walker with a seat so that you have some type of support to keep you from falling and to help stabilize your gait instability.

## 2017-10-09 NOTE — Telephone Encounter (Signed)
Talked to patient.

## 2017-10-10 LAB — HEPATIC FUNCTION PANEL
ALT: 10 IU/L (ref 0–32)
AST: 18 IU/L (ref 0–40)
Albumin: 3.7 g/dL (ref 3.5–4.8)
Alkaline Phosphatase: 84 IU/L (ref 39–117)
BILIRUBIN TOTAL: 0.4 mg/dL (ref 0.0–1.2)
BILIRUBIN, DIRECT: 0.14 mg/dL (ref 0.00–0.40)
Total Protein: 6.5 g/dL (ref 6.0–8.5)

## 2017-10-10 LAB — CBC WITH DIFFERENTIAL/PLATELET
Basophils Absolute: 0 10*3/uL (ref 0.0–0.2)
Basos: 0 %
EOS (ABSOLUTE): 0.1 10*3/uL (ref 0.0–0.4)
Eos: 2 %
HEMOGLOBIN: 13.2 g/dL (ref 11.1–15.9)
Hematocrit: 41.1 % (ref 34.0–46.6)
IMMATURE GRANULOCYTES: 0 %
Immature Grans (Abs): 0 10*3/uL (ref 0.0–0.1)
LYMPHS ABS: 1.9 10*3/uL (ref 0.7–3.1)
Lymphs: 23 %
MCH: 28.6 pg (ref 26.6–33.0)
MCHC: 32.1 g/dL (ref 31.5–35.7)
MCV: 89 fL (ref 79–97)
MONOCYTES: 6 %
Monocytes Absolute: 0.5 10*3/uL (ref 0.1–0.9)
NEUTROS PCT: 69 %
Neutrophils Absolute: 5.6 10*3/uL (ref 1.4–7.0)
Platelets: 241 10*3/uL (ref 150–379)
RBC: 4.62 x10E6/uL (ref 3.77–5.28)
RDW: 14.4 % (ref 12.3–15.4)
WBC: 8.2 10*3/uL (ref 3.4–10.8)

## 2017-10-10 LAB — VITAMIN D 25 HYDROXY (VIT D DEFICIENCY, FRACTURES): VIT D 25 HYDROXY: 116 ng/mL — AB (ref 30.0–100.0)

## 2017-10-10 LAB — LIPID PANEL
CHOLESTEROL TOTAL: 129 mg/dL (ref 100–199)
Chol/HDL Ratio: 2.3 ratio (ref 0.0–4.4)
HDL: 56 mg/dL (ref >39)
LDL CALC: 58 mg/dL (ref 0–99)
TRIGLYCERIDES: 73 mg/dL (ref 0–149)
VLDL Cholesterol Cal: 15 mg/dL (ref 5–40)

## 2017-10-10 LAB — BMP8+EGFR
BUN / CREAT RATIO: 18 (ref 12–28)
BUN: 19 mg/dL (ref 8–27)
CO2: 23 mmol/L (ref 20–29)
CREATININE: 1.07 mg/dL — AB (ref 0.57–1.00)
Calcium: 9.7 mg/dL (ref 8.7–10.3)
Chloride: 105 mmol/L (ref 96–106)
GFR calc Af Amer: 57 mL/min/{1.73_m2} — ABNORMAL LOW (ref >59)
GFR calc non Af Amer: 49 mL/min/{1.73_m2} — ABNORMAL LOW (ref >59)
GLUCOSE: 96 mg/dL (ref 65–99)
Potassium: 4.2 mmol/L (ref 3.5–5.2)
Sodium: 147 mmol/L — ABNORMAL HIGH (ref 134–144)

## 2017-10-13 ENCOUNTER — Telehealth: Payer: Self-pay | Admitting: Family Medicine

## 2017-10-13 DIAGNOSIS — R531 Weakness: Secondary | ICD-10-CM

## 2017-10-13 DIAGNOSIS — R55 Syncope and collapse: Secondary | ICD-10-CM

## 2017-10-13 DIAGNOSIS — R0602 Shortness of breath: Secondary | ICD-10-CM

## 2017-10-13 DIAGNOSIS — M1990 Unspecified osteoarthritis, unspecified site: Secondary | ICD-10-CM

## 2017-10-13 NOTE — Telephone Encounter (Signed)
Faxed rx to [facility] 

## 2017-10-13 NOTE — Addendum Note (Signed)
Addended by: Zannie Cove on: 10/13/2017 11:05 AM   Modules accepted: Orders

## 2017-10-13 NOTE — Telephone Encounter (Signed)
Needs walker Rx faxed to [facility]. [phone]

## 2017-10-13 NOTE — Telephone Encounter (Signed)
Pt aware.

## 2017-10-13 NOTE — Telephone Encounter (Signed)
It appears this has been taking care of.

## 2017-10-13 NOTE — Telephone Encounter (Signed)
These call patient to talk about walker

## 2017-10-15 ENCOUNTER — Ambulatory Visit: Payer: Medicare Other

## 2017-10-15 ENCOUNTER — Telehealth: Payer: Self-pay | Admitting: Family Medicine

## 2017-10-15 DIAGNOSIS — R55 Syncope and collapse: Secondary | ICD-10-CM

## 2017-10-15 DIAGNOSIS — R0602 Shortness of breath: Secondary | ICD-10-CM

## 2017-10-15 DIAGNOSIS — M1990 Unspecified osteoarthritis, unspecified site: Secondary | ICD-10-CM

## 2017-10-15 DIAGNOSIS — Z8673 Personal history of transient ischemic attack (TIA), and cerebral infarction without residual deficits: Secondary | ICD-10-CM

## 2017-10-15 NOTE — Telephone Encounter (Signed)
Pt aware that walker rx was re-written and faxed

## 2017-10-15 NOTE — Telephone Encounter (Signed)
Wrong  Walker sent in per pt. She needs a 2 wheel with a seat and needs to be upright, do she doesn't have to bend over?

## 2017-10-16 ENCOUNTER — Telehealth: Payer: Self-pay

## 2017-10-16 DIAGNOSIS — R269 Unspecified abnormalities of gait and mobility: Secondary | ICD-10-CM | POA: Diagnosis not present

## 2017-10-16 DIAGNOSIS — M6281 Muscle weakness (generalized): Secondary | ICD-10-CM | POA: Diagnosis not present

## 2017-10-16 NOTE — Telephone Encounter (Signed)
Cindy Duran with [facility] states that last orders were identical to orders faxed on 2/27. Patient is refusing all orders that have been faxed over. Patient wants a 2 wheel walker. Cindy Duran can be reached at [phone]

## 2017-10-19 NOTE — Telephone Encounter (Signed)
TC to Adrienne at [facility] - Inpatient encounter pt has walker she needs

## 2018-04-08 ENCOUNTER — Ambulatory Visit: Payer: Medicare Other | Admitting: Family Medicine

## 2018-04-26 ENCOUNTER — Other Ambulatory Visit: Payer: Self-pay | Admitting: Nurse Practitioner

## 2018-04-27 NOTE — Telephone Encounter (Signed)
Last seen 10/09/17  Last lipid 10/09/17  Last thyroid 04/08/17

## 2018-05-12 ENCOUNTER — Encounter: Payer: Self-pay | Admitting: Physician Assistant

## 2018-05-12 ENCOUNTER — Ambulatory Visit (INDEPENDENT_AMBULATORY_CARE_PROVIDER_SITE_OTHER): Payer: Medicare Other | Admitting: Physician Assistant

## 2018-05-12 VITALS — BP 74/49 | HR 43 | Temp 97.7°F | Ht 64.0 in | Wt 160.0 lb

## 2018-05-12 DIAGNOSIS — M159 Polyosteoarthritis, unspecified: Secondary | ICD-10-CM

## 2018-05-12 DIAGNOSIS — Z23 Encounter for immunization: Secondary | ICD-10-CM

## 2018-05-12 DIAGNOSIS — I959 Hypotension, unspecified: Secondary | ICD-10-CM | POA: Diagnosis not present

## 2018-05-12 DIAGNOSIS — M150 Primary generalized (osteo)arthritis: Secondary | ICD-10-CM | POA: Diagnosis not present

## 2018-05-12 DIAGNOSIS — R55 Syncope and collapse: Secondary | ICD-10-CM | POA: Diagnosis not present

## 2018-05-12 DIAGNOSIS — I499 Cardiac arrhythmia, unspecified: Secondary | ICD-10-CM

## 2018-05-12 DIAGNOSIS — E538 Deficiency of other specified B group vitamins: Secondary | ICD-10-CM

## 2018-05-12 MED ORDER — METHYLPREDNISOLONE ACETATE 80 MG/ML IJ SUSP
80.0000 mg | Freq: Once | INTRAMUSCULAR | Status: AC
  Administered 2018-05-12: 80 mg via INTRAMUSCULAR

## 2018-05-12 NOTE — Progress Notes (Signed)
BP (!) 74/49 (BP Location: Right Arm, Patient Position: Standing, Cuff Size: Large)   Pulse (!) 43   Temp 97.7 F (36.5 C) (Oral)   Ht '5\' 4"'  (1.626 m)   Wt 160 lb (72.6 kg)   BMI 27.46 kg/m    Subjective:    Patient ID: Cindy Duran, female    DOB: [birth year], 82 y.o.   MRN: [id number]  HPI: Cindy Duran is a 82 y.o. female presenting on 05/12/2018 for Loss of Consciousness (episode of syncope around 10:30. Her son in law was with her and gently helped her to the ground. Once she came to she felt shaky and jittery for about an hour. No head injury. ) and Eye Problem (prior to episode her vision was dark and the edges looke like a kaleidescope)   Recent days the patient has a great increase in pain number of syncopal episodes.  She will have a while she is sitting and in addition to.  Her son was with her today I saw her episode and she was quite weak.  She will feel pounding at times and then it would come down.  She has never been diagnosed with any type of arrhythmia.  She does have a history of TIA.  She is on minimal medication.  In the past she had taken a hypertensive medication but had to be stopped due to high hypotension.  While she was here in our office she had hypotension upon standing but she also had a 43 pulse.  When sitting on her numbers were normal.  She denies any fever or chills.  There is no urinary tract symptoms. Past Medical History:  Diagnosis Date  . Classical migraine with intractable migraine 12/03/2015  . Essential hypertension   . Hyperlipidemia   . Hypothyroidism   . Migraine   . TIA (transient ischemic attack)    Relevant past medical, surgical, family and social history reviewed and updated as indicated. Interim medical history since our last visit reviewed. Allergies and medications reviewed and updated. DATA REVIEWED: CHART IN EPIC  Family History reviewed for pertinent findings.  Review of Systems  Constitutional: Positive for fatigue and  fever. Negative for activity change.  HENT: Negative.   Eyes: Negative.   Respiratory: Negative.  Negative for cough and shortness of breath.   Cardiovascular: Positive for palpitations. Negative for chest pain.  Gastrointestinal: Negative.  Negative for abdominal pain.  Endocrine: Negative.   Genitourinary: Negative.  Negative for dysuria.  Musculoskeletal: Negative.   Skin: Negative.   Neurological: Positive for dizziness, syncope and weakness.    Allergies as of 05/12/2018      Reactions   Fioricet [butalbital-apap-caffeine] Other (See Comments)   hallucinations   Iodine Other (See Comments)   "skin comes off"   Latex Other (See Comments)   Skin peeling off   Meloxicam Other (See Comments)   Pt can't remember rxn   Meperidine Hcl Nausea And Vomiting   Morphine Nausea And Vomiting   Other    Clorox Bleach   Penicillins Hives   Rosuvastatin Other (See Comments)   Pt can't remember rxn   Simvastatin Other (See Comments)   Pt can't remember rxn   Sulfonamide Derivatives Swelling      Medication List        Accurate as of 05/12/18  3:06 PM. Always use your most recent med list.          aspirin-acetaminophen-caffeine 250-250-65 MG tablet Commonly known as:  EXCEDRIN MIGRAINE Take by mouth every 6 (six) hours as needed for headache.   atorvastatin 20 MG tablet Commonly known as:  LIPITOR TAKE 1 TABLET BY MOUTH ONCE DAILY   clopidogrel 75 MG tablet Commonly known as:  PLAVIX TAKE 1 TABLET BY MOUTH ONCE DAILY   SYNTHROID 150 MCG tablet Generic drug:  levothyroxine TAKE 1 TABLET BY MOUTH ONCE DAILY IN THE MORNING BEFORE BREAKFAST   Vitamin D-3 5000 units Tabs Take 1 capsule by mouth daily.          Objective:    BP (!) 74/49 (BP Location: Right Arm, Patient Position: Standing, Cuff Size: Large)   Pulse (!) 43   Temp 97.7 F (36.5 C) (Oral)   Ht '5\' 4"'  (1.626 m)   Wt 160 lb (72.6 kg)   BMI 27.46 kg/m   Allergies  Allergen Reactions  . Fioricet  [Butalbital-Apap-Caffeine] Other (See Comments)    hallucinations  . Iodine Other (See Comments)    "skin comes off"  . Latex Other (See Comments)    Skin peeling off  . Meloxicam Other (See Comments)    Pt can't remember rxn  . Meperidine Hcl Nausea And Vomiting  . Morphine Nausea And Vomiting  . Other     Clorox Bleach  . Penicillins Hives  . Rosuvastatin Other (See Comments)    Pt can't remember rxn  . Simvastatin Other (See Comments)    Pt can't remember rxn  . Sulfonamide Derivatives Swelling    Wt Readings from Last 3 Encounters:  05/12/18 160 lb (72.6 kg)  10/09/17 166 lb (75.3 kg)  07/23/17 169 lb (76.7 kg)    Physical Exam  Constitutional: She is oriented to person, place, and time. She appears well-developed and well-nourished.  HENT:  Head: Normocephalic and atraumatic.  Eyes: Pupils are equal, round, and reactive to light. Conjunctivae and EOM are normal.  Cardiovascular: Normal rate, regular rhythm, normal heart sounds and intact distal pulses. Exam reveals no gallop and no friction rub.  No murmur heard. Pulmonary/Chest: Effort normal and breath sounds normal.  Abdominal: Soft. Bowel sounds are normal.  Neurological: She is alert and oriented to person, place, and time. She has normal reflexes.  Skin: Skin is warm and dry. No rash noted.  Psychiatric: She has a normal mood and affect. Her behavior is normal. Judgment and thought content normal.    Results for orders placed or performed in visit on 10/09/17  [facility]  Result Value Ref Range   Glucose 96 65 - 99 mg/dL   BUN 19 8 - 27 mg/dL   Creatinine, Ser 1.07 (H) 0.57 - 1.00 mg/dL   GFR calc non Af Amer 49 (L) >59 mL/min/1.73   GFR calc Af Amer 57 (L) >59 mL/min/1.73   BUN/Creatinine Ratio 18 12 - 28   Sodium 147 (H) 134 - 144 mmol/L   Potassium 4.2 3.5 - 5.2 mmol/L   Chloride 105 96 - 106 mmol/L   CO2 23 20 - 29 mmol/L   Calcium 9.7 8.7 - 10.3 mg/dL  CBC with Differential/Platelet  Result Value  Ref Range   WBC 8.2 3.4 - 10.8 x10E3/uL   RBC 4.62 3.77 - 5.28 x10E6/uL   Hemoglobin 13.2 11.1 - 15.9 g/dL   Hematocrit 41.1 34.0 - 46.6 %   MCV 89 79 - 97 fL   MCH 28.6 26.6 - 33.0 pg   MCHC 32.1 31.5 - 35.7 g/dL   RDW 14.4 12.3 - 15.4 %   Platelets 241  150 - 379 x10E3/uL   Neutrophils 69 Not Estab. %   Lymphs 23 Not Estab. %   Monocytes 6 Not Estab. %   Eos 2 Not Estab. %   Basos 0 Not Estab. %   Neutrophils Absolute 5.6 1.4 - 7.0 x10E3/uL   Lymphocytes Absolute 1.9 0.7 - 3.1 x10E3/uL   Monocytes Absolute 0.5 0.1 - 0.9 x10E3/uL   EOS (ABSOLUTE) 0.1 0.0 - 0.4 x10E3/uL   Basophils Absolute 0.0 0.0 - 0.2 x10E3/uL   Immature Granulocytes 0 Not Estab. %   Immature Grans (Abs) 0.0 0.0 - 0.1 x10E3/uL  Lipid panel  Result Value Ref Range   Cholesterol, Total 129 100 - 199 mg/dL   Triglycerides 73 0 - 149 mg/dL   HDL 56 >39 mg/dL   VLDL Cholesterol Cal 15 5 - 40 mg/dL   LDL Calculated 58 0 - 99 mg/dL   Chol/HDL Ratio 2.3 0.0 - 4.4 ratio  Hepatic function panel  Result Value Ref Range   Total Protein 6.5 6.0 - 8.5 g/dL   Albumin 3.7 3.5 - 4.8 g/dL   Bilirubin Total 0.4 0.0 - 1.2 mg/dL   Bilirubin, Direct 0.14 0.00 - 0.40 mg/dL   Alkaline Phosphatase 84 39 - 117 IU/L   AST 18 0 - 40 IU/L   ALT 10 0 - 32 IU/L  VITAMIN D 25 Hydroxy (Vit-D Deficiency, Fractures)  Result Value Ref Range   Vit D, 25-Hydroxy 116.0 (H) 30.0 - 100.0 ng/mL      Assessment & Plan:   1. Hypotension, unspecified hypotension type - PR SPHYG/BP APP W CUFF AND STET - Ambulatory referral to Cardiology - CMP14+EGFR - CBC with Differential/Platelet - Thyroid Panel With TSH  2. Syncope, unspecified syncope type - Ambulatory referral to Cardiology - CMP14+EGFR - CBC with Differential/Platelet - Thyroid Panel With TSH  3. Cardiac arrhythmia, unspecified cardiac arrhythmia type Cardio consult ASAP  Continue all other maintenance medications as listed above.  Follow up plan: No follow-ups on  file.  Educational handout given for normal  Terald Sleeper PA-C [facility] [address]  [place], [place] [address] [phone]   05/12/2018, 3:06 PM

## 2018-05-13 ENCOUNTER — Other Ambulatory Visit: Payer: Self-pay | Admitting: Physician Assistant

## 2018-05-13 LAB — CBC WITH DIFFERENTIAL/PLATELET
Basophils Absolute: 0 10*3/uL (ref 0.0–0.2)
Basos: 0 %
EOS (ABSOLUTE): 0 10*3/uL (ref 0.0–0.4)
EOS: 1 %
HEMATOCRIT: 41.4 % (ref 34.0–46.6)
HEMOGLOBIN: 13.6 g/dL (ref 11.1–15.9)
IMMATURE GRANULOCYTES: 0 %
Immature Grans (Abs): 0 10*3/uL (ref 0.0–0.1)
Lymphocytes Absolute: 1.3 10*3/uL (ref 0.7–3.1)
Lymphs: 17 %
MCH: 28.9 pg (ref 26.6–33.0)
MCHC: 32.9 g/dL (ref 31.5–35.7)
MCV: 88 fL (ref 79–97)
MONOCYTES: 8 %
Monocytes Absolute: 0.6 10*3/uL (ref 0.1–0.9)
Neutrophils Absolute: 5.7 10*3/uL (ref 1.4–7.0)
Neutrophils: 74 %
Platelets: 200 10*3/uL (ref 150–450)
RBC: 4.7 x10E6/uL (ref 3.77–5.28)
RDW: 12.4 % (ref 12.3–15.4)
WBC: 7.8 10*3/uL (ref 3.4–10.8)

## 2018-05-13 LAB — CMP14+EGFR
ALBUMIN: 4 g/dL (ref 3.5–4.7)
ALK PHOS: 76 IU/L (ref 39–117)
ALT: 12 IU/L (ref 0–32)
AST: 17 IU/L (ref 0–40)
Albumin/Globulin Ratio: 1.9 (ref 1.2–2.2)
BUN/Creatinine Ratio: 15 (ref 12–28)
BUN: 15 mg/dL (ref 8–27)
Bilirubin Total: 0.5 mg/dL (ref 0.0–1.2)
CO2: 26 mmol/L (ref 20–29)
CREATININE: 0.99 mg/dL (ref 0.57–1.00)
Calcium: 9.5 mg/dL (ref 8.7–10.3)
Chloride: 103 mmol/L (ref 96–106)
GFR calc Af Amer: 62 mL/min/{1.73_m2} (ref >59)
GFR calc non Af Amer: 54 mL/min/{1.73_m2} — ABNORMAL LOW (ref >59)
GLOBULIN, TOTAL: 2.1 g/dL (ref 1.5–4.5)
GLUCOSE: 111 mg/dL — AB (ref 65–99)
Potassium: 4.1 mmol/L (ref 3.5–5.2)
Sodium: 146 mmol/L — ABNORMAL HIGH (ref 134–144)
Total Protein: 6.1 g/dL (ref 6.0–8.5)

## 2018-05-13 LAB — THYROID PANEL WITH TSH
Free Thyroxine Index: 5.3 — ABNORMAL HIGH (ref 1.2–4.9)
T3 UPTAKE RATIO: 36 % (ref 24–39)
T4 TOTAL: 14.7 ug/dL — AB (ref 4.5–12.0)
TSH: 0.038 u[IU]/mL — ABNORMAL LOW (ref 0.450–4.500)

## 2018-05-13 LAB — VITAMIN B12: Vitamin B-12: >2000 pg/mL — ABNORMAL HIGH (ref 232–1245)

## 2018-05-13 MED ORDER — LEVOTHYROXINE SODIUM 125 MCG PO TABS: 125.0000 ug | ORAL_TABLET | Freq: Every day | ORAL | 0 refills | Status: DC

## 2018-05-17 ENCOUNTER — Encounter: Payer: Self-pay | Admitting: Cardiology

## 2018-05-17 ENCOUNTER — Ambulatory Visit (INDEPENDENT_AMBULATORY_CARE_PROVIDER_SITE_OTHER): Payer: Medicare Other | Admitting: Cardiology

## 2018-05-17 VITALS — BP 112/62 | HR 75 | Ht 64.0 in | Wt 159.0 lb

## 2018-05-17 DIAGNOSIS — R55 Syncope and collapse: Secondary | ICD-10-CM | POA: Diagnosis not present

## 2018-05-17 NOTE — Patient Instructions (Signed)
Medication Instructions:  Your physician recommends that you continue on your current medications as directed. Please refer to the Current Medication list given to you today.  Labwork: None  Testing/Procedures: Your physician has requested that you have an echocardiogram. Echocardiography is a painless test that uses sound waves to create images of your heart. It provides your doctor with information about the size and shape of your heart and how well your heart's chambers and valves are working. This procedure takes approximately one hour. There are no restrictions for this procedure.  Follow-Up: Your physician recommends that you schedule a follow-up appointment in: 3 months  Any Other Special Instructions Will Be Listed Below (If Applicable).     If you need a refill on your cardiac medications before your next appointment, please call your pharmacy.   [facility]  Maudy Yonan A, RN, BSN  

## 2018-05-17 NOTE — Progress Notes (Signed)
Cardiology Office Note:    Date:  05/17/2018   ID:  Cindy Duran, DOB June 29, 1936, MRN [id number]  PCP:  Chipper Herb, MD  Cardiologist:  Jenean Lindau, MD   Referring MD: Chipper Herb, MD    ASSESSMENT:    1. Syncope, unspecified syncope type    PLAN:    In order of problems listed above:  1. Primary prevention stressed with the patient.  Importance of compliance with diet and medication stressed and she vocalized understanding.  I think her symptoms are largely originating from low blood pressure.  I encouraged salt and fluids in the diet and we will follow-up with her as to how she feels. 2. Her blood pressure is stable at this point.  Echocardiogram will be done to assess murmur heard on auscultation.  I also agree with the fact that she has been already set up for a 48-hour Holter monitor to assess for any arrhythmias. 3. Patient will be seen in follow-up appointment in 8months or earlier if the patient has any concerns.  She knows to go to the nearest emergency room for any significant concerns.   Medication Adjustments/Labs and Tests Ordered: Current medicines are reviewed at length with the patient today.  Concerns regarding medicines are outlined above.  Orders Placed This Encounter  Procedures  . EKG 12-Lead  . ECHOCARDIOGRAM COMPLETE   No orders of the defined types were placed in this encounter.    History of Present Illness:    Cindy Duran is a 82 y.o. female who is being seen today for the evaluation of syncope at the request of Chipper Herb, MD.  Patient is a pleasant 82 year old female.  She has past medical history of multiple TIA and history of stroke.  She mentions to me that her stroke was detected on a CT scan.  Recently it appears that she has been running low blood pressures.  She feels weak.  When she tries to change posture she feels lightheaded and has passed out on one occasion.  Subsequently she rested and felt better.  No history of  dizziness palpitations or any such issues.  She is brought in by her son-in-law in a wheelchair.  At the time of my evaluation, the patient is alert awake oriented and in no distress.  Past Medical History:  Diagnosis Date  . Classical migraine with intractable migraine 12/03/2015  . Essential hypertension   . Hyperlipidemia   . Hypothyroidism   . Migraine   . TIA (transient ischemic attack)     Past Surgical History:  Procedure Laterality Date  . ABDOMINAL HYSTERECTOMY    . APPENDECTOMY    . ROTATOR CUFF REPAIR    . SHOULDER SURGERY      Current Medications: Current Meds  Medication Sig  . aspirin-acetaminophen-caffeine (EXCEDRIN MIGRAINE) 270-350-09 MG tablet Take by mouth every 6 (six) hours as needed for headache.  Marland Kitchen atorvastatin (LIPITOR) 20 MG tablet TAKE 1 TABLET BY MOUTH ONCE DAILY  . Cholecalciferol (VITAMIN D-3) 5000 UNITS TABS Take 1 capsule by mouth daily.   . clopidogrel (PLAVIX) 75 MG tablet TAKE 1 TABLET BY MOUTH ONCE DAILY  . levothyroxine (SYNTHROID, LEVOTHROID) 125 MCG tablet Take 1 tablet (125 mcg total) by mouth daily.   Current Facility-Administered Medications for the 05/17/18 encounter (Office Visit) with Sonya Pucci, Reita Cliche, MD  Medication  . cyanocobalamin ((VITAMIN B-12)) injection 1,000 mcg     Allergies:   Fioricet [butalbital-apap-caffeine]; Iodine; Latex; Meloxicam; Meperidine hcl; Morphine;  Other; Penicillins; Rosuvastatin; Simvastatin; and Sulfonamide derivatives   Social History   Socioeconomic History  . Marital status: Widowed    Spouse name: Not on file  . Number of children: 3  . Years of education: [employer]  . Highest education level: Not on file  Occupational History  . 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 Financial resource strain: Not hard at all  . Food insecurity:    Worry: Never true    Inability: Never true  . Transportation needs:    Medical: No    Non-medical: No  Tobacco Use  .  Smoking status: Never Smoker  . Smokeless tobacco: Never Used  Substance and Sexual Activity  . Alcohol use: No  . Drug use: No  . Sexual activity: Never  Lifestyle  . Physical activity:    Days per week: 0 days    Minutes per session: 0 min  . Stress: Only a little  Relationships  . Social connections:    Talks on phone: More than three times a week    Gets together: More than three times a week    Attends religious service: More than 4 times per year    Active member of club or organization: Yes    Attends meetings of clubs or organizations: More than 4 times per year    Relationship status: Widowed  Other Topics Concern  . Not on file  Social History Narrative   Patient lives with daughters.   Right-handed.   Caffeine Use: 1.5 Mountain Dew 12oz daily     Family History: The patient's family history includes Alzheimer's disease in her mother; Dementia (age of onset: 92) in her sister; Heart attack in her brother and maternal aunt; Osteoporosis in her mother; Stroke in her father, maternal aunt, and mother; Thyroid disease in her daughter.  ROS:   Please see the history of present illness.    All other systems reviewed and are negative.  EKGs/Labs/Other Studies Reviewed:    The following studies were reviewed today: I discussed my findings with the patient at extensive length.  EKG reveals sinus rhythm and nonspecific ST-T changes.   Recent Labs: 05/12/2018: ALT 12; BUN 15; Creatinine, Ser 0.99; Hemoglobin 13.6; Platelets 200; Potassium 4.1; Sodium 146; TSH 0.038  Recent Lipid Panel    Component Value Date/Time   CHOL 129 10/09/2017 1217   TRIG 73 10/09/2017 1217   TRIG 104 04/30/2015 1228   HDL 56 10/09/2017 1217   HDL 53 04/30/2015 1228   CHOLHDL 2.3 10/09/2017 1217   CHOLHDL 3.0 11/08/2015 0110   VLDL 12 11/08/2015 0110   LDLCALC 58 10/09/2017 1217   LDLCALC 86 10/20/2013 1338    Physical Exam:    VS:  BP 112/62 (BP Location: Right Arm, Patient Position:  Sitting, Cuff Size: Normal)   Pulse 75   Ht 5\' 4"  (1.626 m)   Wt 159 lb (72.1 kg)   SpO2 98%   BMI 27.29 kg/m     Wt Readings from Last 3 Encounters:  05/17/18 159 lb (72.1 kg)  05/12/18 160 lb (72.6 kg)  10/09/17 166 lb (75.3 kg)     GEN: Patient is in no acute distress HEENT: Normal NECK: No JVD; No carotid bruits LYMPHATICS: No lymphadenopathy CARDIAC: S1 S2 regular, 2/6 systolic murmur at the apex. RESPIRATORY:  Clear to auscultation without rales, wheezing or rhonchi  ABDOMEN: Soft, non-tender, non-distended MUSCULOSKELETAL:  No edema; No deformity  SKIN: Warm  and dry NEUROLOGIC:  Alert and oriented x 3 PSYCHIATRIC:  Normal affect    Signed, Jenean Lindau, MD  05/17/2018 3:34 PM    [place]

## 2018-05-27 ENCOUNTER — Ambulatory Visit (HOSPITAL_BASED_OUTPATIENT_CLINIC_OR_DEPARTMENT_OTHER)
Admission: RE | Admit: 2018-05-27 | Discharge: 2018-05-27 | Disposition: A | Discharge status: Home / Self Care | Payer: Medicare Other | Source: Ambulatory Visit | Attending: Cardiology | Admitting: Cardiology

## 2018-05-27 DIAGNOSIS — E785 Hyperlipidemia, unspecified: Secondary | ICD-10-CM | POA: Diagnosis not present

## 2018-05-27 DIAGNOSIS — I10 Essential (primary) hypertension: Secondary | ICD-10-CM | POA: Insufficient documentation

## 2018-05-27 DIAGNOSIS — R55 Syncope and collapse: Secondary | ICD-10-CM | POA: Diagnosis not present

## 2018-05-27 NOTE — Progress Notes (Signed)
  Echocardiogram 2D Echocardiogram has been performed.  Cindy Duran T Cindy Duran 05/27/2018, 4:12 PM

## 2018-05-28 ENCOUNTER — Telehealth: Payer: Self-pay

## 2018-05-28 NOTE — Telephone Encounter (Signed)
-----   Message from Jenean Lindau, MD sent at 05/28/2018  9:18 AM EDT ----- The results of the study is unremarkable. Please inform patient. I will discuss in detail at next appointment. Cc  primary care/referring physician Jenean Lindau, MD 05/28/2018 9:18 AM

## 2018-05-28 NOTE — Telephone Encounter (Signed)
Patient called and notified of test results. 

## 2018-07-02 ENCOUNTER — Encounter: Payer: Self-pay | Admitting: Family Medicine

## 2018-07-02 ENCOUNTER — Ambulatory Visit (INDEPENDENT_AMBULATORY_CARE_PROVIDER_SITE_OTHER): Payer: Medicare Other | Admitting: Family Medicine

## 2018-07-02 VITALS — BP 130/79 | HR 77 | Temp 96.8°F | Ht 64.0 in | Wt 159.0 lb

## 2018-07-02 DIAGNOSIS — M545 Low back pain, unspecified: Secondary | ICD-10-CM

## 2018-07-02 DIAGNOSIS — M150 Primary generalized (osteo)arthritis: Secondary | ICD-10-CM | POA: Diagnosis not present

## 2018-07-02 DIAGNOSIS — M159 Polyosteoarthritis, unspecified: Secondary | ICD-10-CM

## 2018-07-02 DIAGNOSIS — E538 Deficiency of other specified B group vitamins: Secondary | ICD-10-CM

## 2018-07-02 NOTE — Progress Notes (Signed)
Subjective:    Patient ID: Cindy Duran, female    DOB: 1935/12/30, 82 y.o.   MRN: [id number]  HPI Patient here today for low back pain and she is requesting a cortisone shot.  She is also due to get her B12 shot.  Patient has low back pain near the lower iliac crest area.  She has no history of any injury.   Patient Active Problem List   Diagnosis Date Noted  . Classical migraine with intractable migraine 12/03/2015  . UTI (urinary tract infection) 11/08/2015  . Left facial numbness   . Hypothyroidism 11/27/2014  . Allergic rhinitis due to pollen 04/12/2014  . Vitamin D deficiency 04/12/2014  . Other migraine with status migrainosus, not intractable 04/12/2014  . Osteoporosis, senile 11/16/2013  . Osteoarthritis 10/12/2013  . TIA (transient ischemic attack) 04/16/2013  . Hyperlipidemia 04/16/2013  . HYPERTENSION, BENIGN 05/30/2009  . SHORTNESS OF BREATH 05/30/2009   Outpatient Encounter Medications as of 07/02/2018  Medication Sig  . aspirin-acetaminophen-caffeine (EXCEDRIN MIGRAINE) 427-062-37 MG tablet Take by mouth every 6 (six) hours as needed for headache.  Marland Kitchen atorvastatin (LIPITOR) 20 MG tablet TAKE 1 TABLET BY MOUTH ONCE DAILY  . Cholecalciferol (VITAMIN D-3) 5000 UNITS TABS Take 1 capsule by mouth daily.   . clopidogrel (PLAVIX) 75 MG tablet TAKE 1 TABLET BY MOUTH ONCE DAILY  . levothyroxine (SYNTHROID, LEVOTHROID) 125 MCG tablet Take 1 tablet (125 mcg total) by mouth daily.   Facility-Administered Encounter Medications as of 07/02/2018  Medication  . cyanocobalamin ((VITAMIN B-12)) injection 1,000 mcg      Review of Systems  Constitutional: Negative.   HENT: Negative.   Eyes: Negative.   Respiratory: Negative.   Cardiovascular: Negative.   Gastrointestinal: Negative.   Endocrine: Negative.   Genitourinary: Negative.   Musculoskeletal: Positive for back pain (low right side ).  Skin: Negative.   Allergic/Immunologic: Negative.   Neurological: Negative.     Hematological: Negative.   Psychiatric/Behavioral: Negative.        Objective:   Physical Exam  Constitutional: She is oriented to person, place, and time. She appears well-developed and well-nourished. No distress.  Patient is elderly and alert and slow with her movement  HENT:  Head: Normocephalic and atraumatic.  Eyes: Pupils are equal, round, and reactive to light. Conjunctivae and EOM are normal. Right eye exhibits no discharge. Left eye exhibits no discharge.  Musculoskeletal: She exhibits tenderness and deformity.  Patient is somewhat kyphotic and points to an area near the posterior iliac crest in the right low back where most of the pain is located.  60 of Depo-Medrol was injected into the area after cleansing with Betadine solution under sterile conditions.  Tolerated procedure well.  Neurological: She is alert and oriented to person, place, and time.  Skin: Skin is warm and dry. No rash noted.  Psychiatric: She has a normal mood and affect. Her behavior is normal. Judgment and thought content normal.  Mood affect and behavior were all normal for this patient.  Nursing note and vitals reviewed.  BP 130/79 (BP Location: Left Arm)   Pulse 77   Temp (!) 96.8 F (36 C) (Oral)   Ht 5\' 4"  (1.626 m)   Wt 159 lb (72.1 kg)   BMI 27.29 kg/m         Assessment & Plan:  1. Primary osteoarthritis involving multiple joints -Continue to be careful and not to put herself at risk for falling. -Take Tylenol as needed for pain -Use walker  with gait instability  2. Low back pain without sciatica, unspecified back pain laterality, unspecified chronicity -60 of Depo-Medrol to right low back point tender area posterior iliac crest area  Patient Instructions  Continue to be careful not put yourself at risk for falling Use warm wet compresses to the low back Take Tylenol if needed for aches pains and fever  Arrie Senate MD

## 2018-07-02 NOTE — Patient Instructions (Signed)
Continue to be careful not put yourself at risk for falling Use warm wet compresses to the low back Take Tylenol if needed for aches pains and fever

## 2018-07-22 ENCOUNTER — Ambulatory Visit: Payer: Self-pay | Admitting: Cardiology

## 2018-07-29 ENCOUNTER — Ambulatory Visit: Payer: Self-pay | Admitting: Cardiology

## 2018-08-09 ENCOUNTER — Other Ambulatory Visit: Payer: Self-pay | Admitting: Family Medicine

## 2018-08-09 NOTE — Telephone Encounter (Signed)
Last lipid 10/09/17  DWM

## 2018-08-10 ENCOUNTER — Other Ambulatory Visit: Payer: Self-pay | Admitting: Family Medicine

## 2018-08-12 ENCOUNTER — Ambulatory Visit: Payer: Medicare Other

## 2018-08-13 ENCOUNTER — Telehealth: Payer: Self-pay | Admitting: *Deleted

## 2018-08-13 MED ORDER — ONDANSETRON HCL 4 MG PO TABS: 4.0000 mg | ORAL_TABLET | Freq: Three times a day (TID) | ORAL | 0 refills | Status: DC | PRN

## 2018-08-13 NOTE — Telephone Encounter (Signed)
zofran sent to pharmacy. 

## 2018-08-13 NOTE — Telephone Encounter (Signed)
Pt is weak, nausea, vomiting, little diarrhea. Cant keep fluids down, taking sips only of ginger ale.  Started yesterday afternoon (must be viral -several family members have had this)  Can we call in nausea med to [place]    They will continue to have her take sips and eat ice. Aware to have clear liquids only for 24 hours.  Aware if no better tomorrow - will need to be seen. Call Gina at [phone] - when called in.

## 2018-08-13 NOTE — Telephone Encounter (Signed)
No answer, voicemail full, unable to leave message

## 2018-08-13 NOTE — Telephone Encounter (Signed)
Cindy Duran is aware that medication was sent to pharmacy

## 2018-09-10 ENCOUNTER — Ambulatory Visit (INDEPENDENT_AMBULATORY_CARE_PROVIDER_SITE_OTHER): Payer: Medicare Other | Admitting: Nurse Practitioner

## 2018-09-10 ENCOUNTER — Encounter: Payer: Self-pay | Admitting: Nurse Practitioner

## 2018-09-10 VITALS — BP 178/97 | HR 85 | Temp 97.6°F | Ht 64.0 in | Wt 149.0 lb

## 2018-09-10 DIAGNOSIS — M545 Low back pain, unspecified: Secondary | ICD-10-CM

## 2018-09-10 MED ORDER — TRAMADOL HCL 50 MG PO TABS: 50.0000 mg | ORAL_TABLET | Freq: Three times a day (TID) | ORAL | 0 refills | Status: DC | PRN

## 2018-09-10 MED ORDER — METHYLPREDNISOLONE ACETATE 80 MG/ML IJ SUSP
80.0000 mg | Freq: Once | INTRAMUSCULAR | Status: AC
  Administered 2018-09-10: 80 mg via INTRAMUSCULAR

## 2018-09-10 NOTE — Progress Notes (Signed)
   Subjective:    Patient ID: Cindy Duran, female    DOB: 15-Dec-1935, 83 y.o.   MRN: [id number]   Chief Complaint: Back Pain   HPI Patient is brought in by her daughter today c/o back pain. She has had back pain for many years and gets frequent shots in her back which help. Pain today is stronger then usual. Last shot was in November. Rates pain today 10/10. Constant pain. Nothing seems to make it worse. She has not done anything to make it better. Pain stays in back and does not radiate down either leg.   Review of Systems  Constitutional: Negative.   Respiratory: Negative.   Cardiovascular: Negative.   Musculoskeletal: Positive for back pain.  Neurological: Negative.   Psychiatric/Behavioral: Negative.   All other systems reviewed and are negative.      Objective:   Physical Exam Vitals signs and nursing note reviewed.  Constitutional:      Appearance: She is obese.  Cardiovascular:     Rate and Rhythm: Normal rate and regular rhythm.  Pulmonary:     Effort: Pulmonary effort is normal.     Breath sounds: Normal breath sounds.  Musculoskeletal:     Comments: decreae ROM of lumbar spine due to pain with any movement Gait is slow and steady.  Skin:    General: Skin is warm and dry.  Neurological:     General: No focal deficit present.     Mental Status: She is alert and oriented to person, place, and time.  Psychiatric:        Mood and Affect: Mood normal.    BP (!) 178/97   Pulse 85   Temp 97.6 F (36.4 C) (Oral)   Ht 5\' 4"  (1.626 m)   Wt 149 lb (67.6 kg)   BMI 25.58 kg/m     Assessment & Plan:  Cindy Duran in today with chief complaint of Back Pain   1. Acute midline low back pain without sciatica Moist heat Rest RTO prn - methylPREDNISolone acetate (DEPO-MEDROL) injection 80 mg - traMADol (ULTRAM) 50 MG tablet; Take 1 tablet (50 mg total) by mouth every 8 (eight) hours as needed.  Dispense: 30 tablet; Refill: 0  Mary-Margaret Hassell Done, FNP

## 2018-09-10 NOTE — Patient Instructions (Signed)

## 2018-09-16 ENCOUNTER — Ambulatory Visit: Payer: Medicare Other

## 2018-09-24 ENCOUNTER — Telehealth: Payer: Self-pay | Admitting: *Deleted

## 2018-09-24 MED ORDER — OSELTAMIVIR PHOSPHATE 75 MG PO CAPS: 75.0000 mg | ORAL_CAPSULE | Freq: Every day | ORAL | 0 refills | Status: DC

## 2018-09-24 NOTE — Telephone Encounter (Signed)
daughter aware

## 2018-09-24 NOTE — Telephone Encounter (Signed)
Her Cindy Duran was diagnosed with flu yesterday, she is coughing some this morning. Please send Tamiflu to [facility] in [place].

## 2018-09-24 NOTE — Telephone Encounter (Signed)
tamiflu sent to pharmacy 

## 2018-10-01 ENCOUNTER — Encounter (HOSPITAL_COMMUNITY): Payer: Self-pay

## 2018-10-01 ENCOUNTER — Observation Stay (HOSPITAL_COMMUNITY)
Admission: EM | Admit: 2018-10-01 | Discharge: 2018-10-02 | Disposition: A | Discharge status: Home / Self Care | Payer: Medicare Other | Attending: Internal Medicine | Admitting: Internal Medicine

## 2018-10-01 ENCOUNTER — Other Ambulatory Visit: Payer: Self-pay

## 2018-10-01 ENCOUNTER — Emergency Department (HOSPITAL_COMMUNITY): Payer: Medicare Other

## 2018-10-01 DIAGNOSIS — Z66 Do not resuscitate: Secondary | ICD-10-CM | POA: Diagnosis not present

## 2018-10-01 DIAGNOSIS — I10 Essential (primary) hypertension: Secondary | ICD-10-CM | POA: Insufficient documentation

## 2018-10-01 DIAGNOSIS — Z880 Allergy status to penicillin: Secondary | ICD-10-CM | POA: Insufficient documentation

## 2018-10-01 DIAGNOSIS — Z79899 Other long term (current) drug therapy: Secondary | ICD-10-CM | POA: Insufficient documentation

## 2018-10-01 DIAGNOSIS — Z885 Allergy status to narcotic agent status: Secondary | ICD-10-CM | POA: Insufficient documentation

## 2018-10-01 DIAGNOSIS — R2681 Unsteadiness on feet: Secondary | ICD-10-CM | POA: Diagnosis not present

## 2018-10-01 DIAGNOSIS — R42 Dizziness and giddiness: Secondary | ICD-10-CM | POA: Diagnosis not present

## 2018-10-01 DIAGNOSIS — Z7982 Long term (current) use of aspirin: Secondary | ICD-10-CM | POA: Diagnosis not present

## 2018-10-01 DIAGNOSIS — E876 Hypokalemia: Secondary | ICD-10-CM

## 2018-10-01 DIAGNOSIS — E785 Hyperlipidemia, unspecified: Secondary | ICD-10-CM | POA: Diagnosis not present

## 2018-10-01 DIAGNOSIS — Z882 Allergy status to sulfonamides status: Secondary | ICD-10-CM | POA: Diagnosis not present

## 2018-10-01 DIAGNOSIS — I951 Orthostatic hypotension: Principal | ICD-10-CM | POA: Diagnosis present

## 2018-10-01 DIAGNOSIS — E039 Hypothyroidism, unspecified: Secondary | ICD-10-CM | POA: Insufficient documentation

## 2018-10-01 DIAGNOSIS — M6281 Muscle weakness (generalized): Secondary | ICD-10-CM | POA: Diagnosis not present

## 2018-10-01 DIAGNOSIS — Z888 Allergy status to other drugs, medicaments and biological substances status: Secondary | ICD-10-CM | POA: Diagnosis not present

## 2018-10-01 DIAGNOSIS — Z8673 Personal history of transient ischemic attack (TIA), and cerebral infarction without residual deficits: Secondary | ICD-10-CM | POA: Diagnosis not present

## 2018-10-01 DIAGNOSIS — J101 Influenza due to other identified influenza virus with other respiratory manifestations: Secondary | ICD-10-CM | POA: Diagnosis not present

## 2018-10-01 DIAGNOSIS — W19XXXA Unspecified fall, initial encounter: Secondary | ICD-10-CM | POA: Diagnosis not present

## 2018-10-01 DIAGNOSIS — R05 Cough: Secondary | ICD-10-CM | POA: Diagnosis not present

## 2018-10-01 DIAGNOSIS — R531 Weakness: Secondary | ICD-10-CM | POA: Diagnosis not present

## 2018-10-01 DIAGNOSIS — R000 Tachycardia, unspecified: Secondary | ICD-10-CM | POA: Diagnosis not present

## 2018-10-01 DIAGNOSIS — N179 Acute kidney failure, unspecified: Secondary | ICD-10-CM | POA: Diagnosis not present

## 2018-10-01 DIAGNOSIS — E860 Dehydration: Secondary | ICD-10-CM | POA: Diagnosis not present

## 2018-10-01 LAB — CBC
HCT: 47.4 % — ABNORMAL HIGH (ref 36.0–46.0)
Hemoglobin: 14.8 g/dL (ref 12.0–15.0)
MCH: 27.9 pg (ref 26.0–34.0)
MCHC: 31.2 g/dL (ref 30.0–36.0)
MCV: 89.3 fL (ref 80.0–100.0)
PLATELETS: 201 10*3/uL (ref 150–400)
RBC: 5.31 MIL/uL — ABNORMAL HIGH (ref 3.87–5.11)
RDW: 13.8 % (ref 11.5–15.5)
WBC: 6.3 10*3/uL (ref 4.0–10.5)
nRBC: 0 % (ref 0.0–0.2)

## 2018-10-01 LAB — BASIC METABOLIC PANEL
Anion gap: 10 (ref 5–15)
BUN: 18 mg/dL (ref 8–23)
CALCIUM: 9.2 mg/dL (ref 8.9–10.3)
CO2: 25 mmol/L (ref 22–32)
Chloride: 107 mmol/L (ref 98–111)
Creatinine, Ser: 1.68 mg/dL — ABNORMAL HIGH (ref 0.44–1.00)
GFR calc Af Amer: 33 mL/min — ABNORMAL LOW (ref >60)
GFR calc non Af Amer: 28 mL/min — ABNORMAL LOW (ref >60)
Glucose, Bld: 125 mg/dL — ABNORMAL HIGH (ref 70–99)
Potassium: 3.4 mmol/L — ABNORMAL LOW (ref 3.5–5.1)
Sodium: 142 mmol/L (ref 135–145)

## 2018-10-01 LAB — URINALYSIS, ROUTINE W REFLEX MICROSCOPIC
Bilirubin Urine: NEGATIVE
Glucose, UA: NEGATIVE mg/dL
Hgb urine dipstick: NEGATIVE
Ketones, ur: NEGATIVE mg/dL
Nitrite: NEGATIVE
PH: 5 (ref 5.0–8.0)
Protein, ur: 30 mg/dL — AB
SPECIFIC GRAVITY, URINE: 1.029 (ref 1.005–1.030)

## 2018-10-01 LAB — INFLUENZA PANEL BY PCR (TYPE A & B)
INFLAPCR: POSITIVE — AB
Influenza B By PCR: NEGATIVE

## 2018-10-01 MED ORDER — POTASSIUM CHLORIDE CRYS ER 20 MEQ PO TBCR
40.0000 meq | EXTENDED_RELEASE_TABLET | Freq: Two times a day (BID) | ORAL | Status: AC
  Administered 2018-10-01 – 2018-10-02 (×2): 40 meq via ORAL
  Filled 2018-10-01 (×2): qty 2

## 2018-10-01 MED ORDER — SODIUM CHLORIDE 0.9 % IV SOLN: INTRAVENOUS | Status: DC

## 2018-10-01 MED ORDER — ACETAMINOPHEN 325 MG PO TABS
650.0000 mg | ORAL_TABLET | Freq: Once | ORAL | Status: AC
  Administered 2018-10-01: 650 mg via ORAL
  Filled 2018-10-01: qty 2

## 2018-10-01 MED ORDER — ONDANSETRON HCL 4 MG PO TABS: 4.0000 mg | ORAL_TABLET | Freq: Four times a day (QID) | ORAL | Status: DC | PRN

## 2018-10-01 MED ORDER — ALBUTEROL SULFATE (2.5 MG/3ML) 0.083% IN NEBU: 3.0000 mL | INHALATION_SOLUTION | RESPIRATORY_TRACT | Status: DC | PRN

## 2018-10-01 MED ORDER — ALBUTEROL SULFATE (2.5 MG/3ML) 0.083% IN NEBU
3.0000 mL | INHALATION_SOLUTION | RESPIRATORY_TRACT | Status: DC
  Administered 2018-10-01: 3 mL via RESPIRATORY_TRACT
  Filled 2018-10-01: qty 3

## 2018-10-01 MED ORDER — SODIUM CHLORIDE 0.9 % IV BOLUS
500.0000 mL | Freq: Once | INTRAVENOUS | Status: AC
  Administered 2018-10-01: 500 mL via INTRAVENOUS

## 2018-10-01 MED ORDER — ACETAMINOPHEN 325 MG PO TABS: 650.0000 mg | ORAL_TABLET | Freq: Four times a day (QID) | ORAL | Status: DC | PRN

## 2018-10-01 MED ORDER — ASPIRIN-ACETAMINOPHEN-CAFFEINE 250-250-65 MG PO TABS
1.0000 | ORAL_TABLET | Freq: Four times a day (QID) | ORAL | Status: DC | PRN
  Administered 2018-10-01: 1 via ORAL
  Filled 2018-10-01 (×3): qty 1

## 2018-10-01 MED ORDER — SODIUM CHLORIDE 0.9% FLUSH: 3.0000 mL | Freq: Once | INTRAVENOUS | Status: DC

## 2018-10-01 MED ORDER — MAGNESIUM OXIDE 400 (241.3 MG) MG PO TABS
400.0000 mg | ORAL_TABLET | Freq: Two times a day (BID) | ORAL | Status: AC
  Administered 2018-10-01 – 2018-10-02 (×2): 400 mg via ORAL
  Filled 2018-10-01 (×2): qty 1

## 2018-10-01 MED ORDER — HEPARIN SODIUM (PORCINE) 5000 UNIT/ML IJ SOLN
5000.0000 [IU] | Freq: Three times a day (TID) | INTRAMUSCULAR | Status: DC
  Administered 2018-10-01 – 2018-10-02 (×4): 5000 [IU] via SUBCUTANEOUS
  Filled 2018-10-01 (×4): qty 1

## 2018-10-01 MED ORDER — SODIUM CHLORIDE 0.9 % IV SOLN
INTRAVENOUS | Status: DC
  Administered 2018-10-01 – 2018-10-02 (×2): via INTRAVENOUS

## 2018-10-01 MED ORDER — ACETAMINOPHEN 650 MG RE SUPP: 650.0000 mg | Freq: Four times a day (QID) | RECTAL | Status: DC | PRN

## 2018-10-01 MED ORDER — LEVOTHYROXINE SODIUM 25 MCG PO TABS
125.0000 ug | ORAL_TABLET | Freq: Every day | ORAL | Status: DC
  Administered 2018-10-02: 125 ug via ORAL
  Filled 2018-10-01: qty 1

## 2018-10-01 MED ORDER — SODIUM CHLORIDE 0.9 % IV BOLUS
1000.0000 mL | Freq: Once | INTRAVENOUS | Status: AC
  Administered 2018-10-01: 1000 mL via INTRAVENOUS

## 2018-10-01 MED ORDER — POLYETHYLENE GLYCOL 3350 17 G PO PACK: 17.0000 g | PACK | Freq: Every day | ORAL | Status: DC | PRN

## 2018-10-01 MED ORDER — CLOPIDOGREL BISULFATE 75 MG PO TABS
75.0000 mg | ORAL_TABLET | Freq: Every day | ORAL | Status: DC
  Administered 2018-10-01 – 2018-10-02 (×2): 75 mg via ORAL
  Filled 2018-10-01 (×2): qty 1

## 2018-10-01 MED ORDER — ONDANSETRON HCL 4 MG/2ML IJ SOLN: 4.0000 mg | Freq: Four times a day (QID) | INTRAMUSCULAR | Status: DC | PRN

## 2018-10-01 NOTE — Plan of Care (Signed)
  Problem: Clinical Measurements: Goal: Ability to maintain clinical measurements within normal limits will improve Outcome: Progressing   

## 2018-10-01 NOTE — H&P (Addendum)
History and Physical  Cindy Duran OZD:[id number] DOB: 11-09-1935 DOA: 10/01/2018  PCP: Chipper Herb, MD Patient coming from: Home  I have personally briefly reviewed patient's old medical records in [place]   Chief Complaint: Dizziness upon standing  HPI: Cindy Duran is a 83 y.o. female past medical history of a TIA, recently treated empirically for influenza on 09/24/2018 (as her grandson was diagnosed with the flu) which she could not complete the treatment due to severe nausea, since then has had  poor oral intake, anorexia and a productive cough.  Comes into the hospital for dizziness upon standing.  She relates that for the last week she has been feeling sluggish and fatigue but 2 days prior to admission she started getting dizzy upon standing.  She relates on the morning of admission she went to fix her breakfast and she fell to the floor did not hit her head did not lose consciousness.  She denies no further nausea or diarrhea, she denies any fever.  But she continues to have a productive cough, no emesis.  She denies any chest pain or shortness of breath.  In the ED: Blood pressure stable, she remains afebrile mild hypokalemia no leukocytosis influenza PCR is positive and UA shows no signs of infection.  Review of Systems: All systems reviewed and apart from history of presenting illness, are negative.  Past Medical History:  Diagnosis Date  . Classical migraine with intractable migraine 12/03/2015  . Essential hypertension   . Hyperlipidemia   . Hypothyroidism   . Migraine   . TIA (transient ischemic attack)    Past Surgical History:  Procedure Laterality Date  . ABDOMINAL HYSTERECTOMY    . APPENDECTOMY    . ROTATOR CUFF REPAIR    . SHOULDER SURGERY     Social History:  reports that she has never smoked. She has never used smokeless tobacco. She reports that she does not drink alcohol or use drugs.   Allergies  Allergen Reactions  . Fioricet  [Butalbital-Apap-Caffeine] Other (See Comments)    hallucinations  . Iodine Other (See Comments)    "skin comes off"  . Latex Other (See Comments)    Skin peeling off  . Meloxicam Other (See Comments)    Pt can't remember rxn  . Meperidine Hcl Nausea And Vomiting  . Morphine Nausea And Vomiting  . Other     Clorox Bleach  . Penicillins Hives  . Rosuvastatin Other (See Comments)    Pt can't remember rxn  . Simvastatin Other (See Comments)    Pt can't remember rxn  . Sulfonamide Derivatives Swelling    Family History  Problem Relation Age of Onset  . Stroke Mother   . Alzheimer's disease Mother   . Osteoporosis Mother        Broken hip  . Stroke Father   . Heart attack Brother   . Heart attack Maternal Aunt   . Stroke Maternal Aunt   . Thyroid disease Daughter   . Dementia Sister 23     Prior to Admission medications   Medication Sig Start Date End Date Taking? Authorizing Provider  aspirin-acetaminophen-caffeine (EXCEDRIN MIGRAINE) [phone] MG tablet Take by mouth every 6 (six) hours as needed for headache.    [provider]  atorvastatin (LIPITOR) 20 MG tablet TAKE 1 TABLET BY MOUTH ONCE DAILY 08/09/18   Hassell Done, Mary-Margaret, FNP  Cholecalciferol (VITAMIN D-3) 5000 UNITS TABS Take 1 capsule by mouth daily.  [provider]  clopidogrel (PLAVIX) 75 MG tablet TAKE 1 TABLET BY MOUTH ONCE DAILY 08/12/18   Chipper Herb, MD  levothyroxine (SYNTHROID, LEVOTHROID) 125 MCG tablet Take 1 tablet (125 mcg total) by mouth daily. 05/13/18   Terald Sleeper, PA-C  ondansetron (ZOFRAN) 4 MG tablet Take 1 tablet (4 mg total) by mouth every 8 (eight) hours as needed for nausea or vomiting. 08/13/18   Hassell Done Mary-Margaret, FNP  oseltamivir (TAMIFLU) 75 MG capsule Take 1 capsule (75 mg total) by mouth daily. 09/24/18   Hassell Done Mary-Margaret, FNP  traMADol (ULTRAM) 50 MG tablet Take 1 tablet (50 mg total) by mouth every 8 (eight) hours as needed. 09/10/18   Chevis Pretty, FNP   Physical Exam: Vitals:   10/01/18 1330 10/01/18 1400 10/01/18 1504 10/01/18 1535  BP: (!) 153/87 (!) 148/87 (!) 166/81 (!) 175/98  Pulse: 79 81 80 79  Resp: 18 19 20    Temp:    98 F (36.7 C)  TempSrc:    Oral  SpO2: 97% 95% 95% 99%     General exam: Moderately built and nourished patient, lying comfortably supine on the gurney in no obvious distress.  Head, eyes and ENT: Nontraumatic and normocephalic. Pupils equally reacting to light and accommodation. Oral mucosa moist.  Neck: Supple. No JVD, carotid bruit or thyromegaly.  Lymphatics: No lymphadenopathy.  Respiratory system: She has good air movement and rhonchi bilaterally.  Cardiovascular system: S1 and S2 heard, RRR.  No appreciated JVD.  Gastrointestinal system: Abdomen is nondistended, soft and nontender. Normal bowel sounds heard. No organomegaly or masses appreciated.  Central nervous system: Alert and oriented. No focal neurological deficits.  Extremities: Symmetric 5 x 5 power. Peripheral pulses symmetrically felt.   Skin: No rashes or acute findings.  Musculoskeletal system: Negative exam.  Psychiatry: Pleasant and cooperative.   Labs on Admission:  Basic Metabolic Panel: Recent Labs  Lab 10/01/18 1117  NA 142  K 3.4*  CL 107  CO2 25  GLUCOSE 125*  BUN 18  CREATININE 1.68*  CALCIUM 9.2   Liver Function Tests: No results for input(s): AST, ALT, ALKPHOS, BILITOT, PROT, ALBUMIN in the last 168 hours. No results for input(s): LIPASE, AMYLASE in the last 168 hours. No results for input(s): AMMONIA in the last 168 hours. CBC: Recent Labs  Lab 10/01/18 1117  WBC 6.3  HGB 14.8  HCT 47.4*  MCV 89.3  PLT 201   Cardiac Enzymes: No results for input(s): CKTOTAL, CKMB, CKMBINDEX, TROPONINI in the last 168 hours.  BNP (last 3 results) No results for input(s): PROBNP in the last 8760 hours. CBG: No results for input(s): GLUCAP in the last 168 hours.  Radiological Exams on  Admission: Dg Chest Port 1 View  Result Date: 10/01/2018 CLINICAL DATA:  Cough. The patient fell today secondary to dizziness. EXAM: PORTABLE CHEST 1 VIEW COMPARISON:  07/15/2016 FINDINGS: The heart size and pulmonary vascularity are normal and the lungs are clear. No acute bone abnormality. Old healed fracture of the proximal right humerus treated with open reduction and internal fixation. Arthritic changes at the right shoulder. IMPRESSION: No acute abnormalities. Electronically Signed   By: Lorriane Shire M.D.   On: 10/01/2018 12:13    EKG: Independently reviewed. none  Assessment/Plan Orthostatic hypotension: Place under observation. She is on no antihypertensive medications at home. Likely due to increase insensible losses with decrease oral intake. Orthostatics were positive in the ED. I will hydrate her aggressively IV, I have encouraged her to  drink as much water as possible and repeat orthostatics in the morning.  AKI (acute kidney injury) ([place]) Likely prerenal azotemia. Start aggressive IV fluids recheck in the morning. Her baseline creatinine is less than 1.     Hypokalemia Likely due to hypovolemia will replete orally recheck in the morning recheck a magnesium level and replete as needed.  Influenza PCR positive: She was initially treated with Tamiflu on 09/24/2018 she  took tamiflu for 3 days and she could not tolerate it due to nausea.  Her influenza PCR is positive today she has remained afebrile she has no leukocytosis no breathing difficulties. I will not continue Tamiflu empirically as she will receive no benefits from treatment.   DVT Prophylaxis: Lovenox Code Status: DNR Family Communication: Daughter and grandaughter Disposition Plan: Observation    Charlynne Cousins MD [facility]   10/01/2018, 3:43 PM

## 2018-10-01 NOTE — ED Notes (Signed)
ED TO INPATIENT HANDOFF REPORT  Name/Age/Gender Cindy Duran 83 y.o. female  Code Status Code Status History    Date Active Date Inactive Code Status Order ID Comments User Context   11/08/2015 0004 11/08/2015 2023 DNR [id number]  Ivor Costa, MD ED   06/15/2014 1839 06/16/2014 1623 Full Code [id number]  Charlynne Cousins, MD Inpatient    Questions for Most Recent Historical Code Status (Order [id number])    Question Answer Comment   In the event of cardiac or respiratory ARREST Do not call a "code blue"    In the event of cardiac or respiratory ARREST Do not perform Intubation, CPR, defibrillation or ACLS    In the event of cardiac or respiratory ARREST Use medication by any route, position, wound care, and other measures to relive pain and suffering. May use oxygen, suction and manual treatment of airway obstruction as needed for comfort.       Home/SNF/Other Home  Chief Complaint weakness  Level of Care/Admitting Diagnosis ED Disposition    ED Disposition Condition House Hospital Area: [place] [[id number]]  Level of Care: Med-Surg [16]  I expect the patient will be discharged within 24 hours: Yes  LOW acuity---Tx typically complete <24 hrs---ACUTE conditions typically can be evaluated <24 hours---LABS likely to return to acceptable levels <24 hours---IS near functional baseline---EXPECTED to return to current living arrangement---NOT newly hypoxic: Meets criteria for 5C-Observation unit  Diagnosis: Orthostatic hypotension [458.0.ICD-9-CM]  Admitting Physician: Charlynne Cousins [[id number]]  Attending Physician: Charlynne Cousins [[id number]]  PT Class (Do Not Modify): Observation [104]  PT Acc Code (Do Not Modify): Observation [10022]       Medical History Past Medical History:  Diagnosis Date  . Classical migraine with intractable migraine 12/03/2015  . Essential hypertension   . Hyperlipidemia   . Hypothyroidism   . Migraine   . TIA  (transient ischemic attack)     Allergies Allergies  Allergen Reactions  . Fioricet [Butalbital-Apap-Caffeine] Other (See Comments)    hallucinations  . Iodine Other (See Comments)    "skin comes off"  . Latex Other (See Comments)    Skin peeling off  . Meloxicam Other (See Comments)    Pt can't remember rxn  . Meperidine Hcl Nausea And Vomiting  . Morphine Nausea And Vomiting  . Other     Clorox Bleach  . Penicillins Hives  . Rosuvastatin Other (See Comments)    Pt can't remember rxn  . Simvastatin Other (See Comments)    Pt can't remember rxn  . Sulfonamide Derivatives Swelling    IV Location/Drains/Wounds Patient Lines/Drains/Airways Status   Active Line/Drains/Airways    Name:   Placement date:   Placement time:   Site:   Days:   Peripheral IV 11/07/15 Left Antecubital   11/07/15    -    Antecubital   1059   Peripheral IV 10/01/18 Right Hand   10/01/18    1240    Hand   less than 1          Labs/Imaging Results for orders placed or performed during the hospital encounter of 10/01/18 (from the past 48 hour(s))  Basic metabolic panel     Status: Abnormal   Collection Time: 10/01/18 11:17 AM  Result Value Ref Range   Sodium 142 135 - 145 mmol/L   Potassium 3.4 (L) 3.5 - 5.1 mmol/L   Chloride 107 98 - 111 mmol/L   CO2 25 22 - 32 mmol/L  Glucose, Bld 125 (H) 70 - 99 mg/dL   BUN 18 8 - 23 mg/dL   Creatinine, Ser 1.68 (H) 0.44 - 1.00 mg/dL   Calcium 9.2 8.9 - 10.3 mg/dL   GFR calc non Af Amer 28 (L) >60 mL/min   GFR calc Af Amer 33 (L) >60 mL/min   Anion gap 10 5 - 15    Comment: Performed at [facility] [address], [place], [place] [address]  CBC     Status: Abnormal   Collection Time: 10/01/18 11:17 AM  Result Value Ref Range   WBC 6.3 4.0 - 10.5 K/uL   RBC 5.31 (H) 3.87 - 5.11 MIL/uL   Hemoglobin 14.8 12.0 - 15.0 g/dL   HCT 47.4 (H) 36.0 - 46.0 %   MCV 89.3 80.0 - 100.0 fL   MCH 27.9 26.0 - 34.0 pg   MCHC 31.2 30.0 - 36.0 g/dL   RDW 13.8  11.5 - 15.5 %   Platelets 201 150 - 400 K/uL   nRBC 0.0 0.0 - 0.2 %    Comment: Performed at [facility], [place] [address] [place], [place] [address]  Influenza panel by PCR (type A & B)     Status: Abnormal   Collection Time: 10/01/18 11:43 AM  Result Value Ref Range   Influenza A By PCR POSITIVE (A) NEGATIVE   Influenza B By PCR NEGATIVE NEGATIVE    Comment: (NOTE) The Xpert Xpress Flu assay is intended as an aid in the diagnosis of  influenza and should not be used as a sole basis for treatment.  This  assay is FDA approved for nasopharyngeal swab specimens only. Nasal  washings and aspirates are unacceptable for Xpert Xpress Flu testing. Performed at [facility], [place] [address] [place], [place] [address]   Urinalysis, Routine w reflex microscopic     Status: Abnormal   Collection Time: 10/01/18 12:15 PM  Result Value Ref Range   Color, Urine AMBER (A) YELLOW    Comment: BIOCHEMICALS MAY BE AFFECTED BY COLOR   APPearance CLOUDY (A) CLEAR   Specific Gravity, Urine 1.029 1.005 - 1.030   pH 5.0 5.0 - 8.0   Glucose, UA NEGATIVE NEGATIVE mg/dL   Hgb urine dipstick NEGATIVE NEGATIVE   Bilirubin Urine NEGATIVE NEGATIVE   Ketones, ur NEGATIVE NEGATIVE mg/dL   Protein, ur 30 (A) NEGATIVE mg/dL   Nitrite NEGATIVE NEGATIVE   Leukocytes,Ua LARGE (A) NEGATIVE   RBC / HPF 0-5 0 - 5 RBC/hpf   WBC, UA 21-50 0 - 5 WBC/hpf   Bacteria, UA FEW (A) NONE SEEN   Squamous Epithelial / LPF 6-10 0 - 5   Mucus PRESENT    Hyaline Casts, UA PRESENT    Ca Oxalate Crys, UA PRESENT     Comment: Performed at [facility], [address], [place], [place] [address]   Dg Chest Port 1 View  Result Date: 10/01/2018 CLINICAL DATA:  Cough. The patient fell today secondary to dizziness. EXAM: PORTABLE CHEST 1 VIEW COMPARISON:  07/15/2016 FINDINGS: The heart size and pulmonary vascularity are normal and the lungs are clear. No acute bone abnormality. Old healed fracture of the proximal right  humerus treated with open reduction and internal fixation. Arthritic changes at the right shoulder. IMPRESSION: No acute abnormalities. Electronically Signed   By: Lorriane Shire M.D.   On: 10/01/2018 12:13   EKG Interpretation  Date/Time:  Friday October 01 2018 11:07:35 EST Ventricular Rate:  101 PR Interval:  150 QRS Duration: 92 QT Interval:  366 QTC Calculation: 474 R Axis:   64 Text Interpretation:  Sinus tachycardia Otherwise normal ECG Confirmed by Lennice Sites [phone]) on 10/01/2018 2:43:34 PM   Pending Labs Unresulted Labs (From admission, onward)    Start     Ordered   10/01/18 1258  Urine culture  Add-on,   STAT     10/01/18 1258   Signed and Held  CBC  (heparin)  Once,   R    Comments:  Baseline for heparin therapy IF NOT ALREADY DRAWN.  Notify MD if PLT < 100 K.    Signed and Held   Signed and Held  Creatinine, serum  (heparin)  Once,   R    Comments:  Baseline for heparin therapy IF NOT ALREADY DRAWN.    Signed and Held   Signed and Held  Basic metabolic panel  Tomorrow morning,   R     Signed and Held   Signed and Held  CBC  Tomorrow morning,   R     Signed and Held          Vitals/Pain Today's Vitals   10/01/18 1300 10/01/18 1330 10/01/18 1400 10/01/18 1505  BP: (!) 142/79 (!) 153/87 (!) 148/87   Pulse: 81 79 81   Resp: 18 18 19    Temp:      TempSrc:      SpO2: 96% 97% 95%   PainSc:    3     Isolation Precautions Droplet precaution  Medications Medications  sodium chloride flush (NS) 0.9 % injection 3 mL (3 mLs Intravenous Not Given 10/01/18 1241)  sodium chloride 0.9 % bolus 1,000 mL (1,000 mLs Intravenous New Bag/Given 10/01/18 1450)  0.9 %  sodium chloride infusion (has no administration in time range)  sodium chloride 0.9 % bolus 500 mL (0 mLs Intravenous Stopped 10/01/18 1335)  acetaminophen (TYLENOL) tablet 650 mg (650 mg Oral Given 10/01/18 1404)    Mobility walks

## 2018-10-01 NOTE — ED Notes (Signed)
Admitting at bedside 

## 2018-10-01 NOTE — ED Provider Notes (Signed)
Medical screening examination/treatment/procedure(s) were conducted as a shared visit with non-physician practitioner(s) and myself.  I personally evaluated the patient during the encounter. Briefly, the patient is a 83 y.o. female with history of migraine, hypertension, high cholesterol who presents the ED with generalized weakness, near syncope, cough.  Patient also has had some nausea, vomiting.  She has had sick contact with the flu and was prophylactically put on Tamiflu.  She had near syncopal episode today while standing in the kitchen.  She states she fell backwards but did not hit her head, no loss consciousness.  Patient with overall normal neurological exam.  Clear breath sounds.  Exam is overall unremarkable.  Patient did test positive for influenza A.  Does have mild kidney injury with a creatinine of 1.58.  Chest x-ray showed no pneumonia.  Otherwise no significant anemia, electrolyte abnormality.  EKG shows sinus tachycardia with normal intervals.  Heart rate was 101.  Patient still feels fatigued even after 500 cc normal saline bolus.  Overall patient with near syncope likely secondary to dehydration from influenza.  Will admit for further hydration and care.  This chart was dictated using voice recognition software.  Despite best efforts to proofread,  errors can occur which can change the documentation meaning.    EKG Interpretation  Date/Time:  Friday October 01 2018 11:07:35 EST Ventricular Rate:  101 PR Interval:  150 QRS Duration: 92 QT Interval:  366 QTC Calculation: 474 R Axis:   64 Text Interpretation:  Sinus tachycardia Otherwise normal ECG Confirmed by Lennice Sites [phone]) on 10/01/2018 2:43:34 PM           Lennice Sites, DO 10/01/18 1446

## 2018-10-01 NOTE — ED Provider Notes (Addendum)
[facility] EMERGENCY DEPARTMENT Provider Note   CSN: [id number] Arrival date & time: 10/01/18  1058     History   Chief Complaint Chief Complaint  Patient presents with  . Weakness  . Cough    HPI Cindy Duran is a 83 y.o. female.  83 year old female brought in by family from home for follow-up.  History provided by granddaughter and patient.  Patient reports not feeling well for the past almost 2 months.  Patient reports vomiting and diarrhea for 2 weeks just after Christmas which improved and then patient developed cough and congestion which have been constant for the past month or so.  Patient reports cough is now productive.  Patient is exposed to her grandson whom she takes care of who recently tested positive for the flu.  Patient called her PCP a week ago who called in Tamiflu for her which she took with resulting vomiting.  Patient states that she was standing in her kitchen today at the stove when her knees felt weak, she tried to hold onto the stove however she fell backwards.  Patient is on Plavix and reports that she intentionally held her head up/chin to chest as she fell to avoid hitting her head on the floor. Patient did not hit her head, no LOC. Patient reports multiple similar episodes previously, has been evaluated by cardiology with halter monitor that she reports was non diagnostic. Patient and family report episodes of feeling dizzy/heart racing/falls have been ongoing for several years, no cause found, nothing new or different about today's episode.      Past Medical History:  Diagnosis Date  . Classical migraine with intractable migraine 12/03/2015  . Essential hypertension   . Hyperlipidemia   . Hypothyroidism   . Migraine   . TIA (transient ischemic attack)     Patient Active Problem List   Diagnosis Date Noted  . Orthostatic hypotension 10/01/2018  . AKI (acute kidney injury) ([place]) 10/01/2018  . Hypokalemia 10/01/2018  . Classical  migraine with intractable migraine 12/03/2015  . UTI (urinary tract infection) 11/08/2015  . Left facial numbness   . Hypothyroidism 11/27/2014  . Allergic rhinitis due to pollen 04/12/2014  . Vitamin D deficiency 04/12/2014  . Other migraine with status migrainosus, not intractable 04/12/2014  . Osteoporosis, senile 11/16/2013  . Osteoarthritis 10/12/2013  . TIA (transient ischemic attack) 04/16/2013  . Hyperlipidemia 04/16/2013  . HYPERTENSION, BENIGN 05/30/2009  . SHORTNESS OF BREATH 05/30/2009    Past Surgical History:  Procedure Laterality Date  . ABDOMINAL HYSTERECTOMY    . APPENDECTOMY    . ROTATOR CUFF REPAIR    . SHOULDER SURGERY       OB History   No obstetric history on file.      Home Medications    Prior to Admission medications   Medication Sig Start Date End Date Taking? Authorizing Provider  aspirin-acetaminophen-caffeine (EXCEDRIN MIGRAINE) [phone] MG tablet Take 2 tablets by mouth every 6 (six) hours as needed for headache or migraine.    Yes [provider]  atorvastatin (LIPITOR) 20 MG tablet TAKE 1 TABLET BY MOUTH ONCE DAILY Patient taking differently: Take 20 mg by mouth daily at 6 PM.  08/09/18  Yes Hassell Done, Mary-Margaret, FNP  Cholecalciferol (VITAMIN D-3) 5000 UNITS TABS Take 1 capsule by mouth See admin instructions. Take 1 capsule once daily by mouth Monday thru Friday   Yes [provider]  clopidogrel (PLAVIX) 75 MG tablet TAKE 1 TABLET BY MOUTH ONCE DAILY Patient  taking differently: Take 75 mg by mouth daily.  08/12/18  Yes Chipper Herb, MD  nitrofurantoin, macrocrystal-monohydrate, (MACROBID) 100 MG capsule Take 1 capsule (100 mg total) by mouth 2 (two) times daily. 1 po BId 10/11/18   Chipper Herb, MD  SYNTHROID 125 MCG tablet Take 1 tablet (125 mcg total) by mouth daily. (need repeat labwork) 10/05/18   Terald Sleeper, PA-C    Family History Family History  Problem Relation Age of Onset  . Stroke Mother   .  Alzheimer's disease Mother   . Osteoporosis Mother        Broken hip  . Stroke Father   . Heart attack Brother   . Heart attack Maternal Aunt   . Stroke Maternal Aunt   . Thyroid disease Daughter   . Dementia Sister 34    Social History Social History   Tobacco Use  . Smoking status: Never Smoker  . Smokeless tobacco: Never Used  Substance Use Topics  . Alcohol use: No  . Drug use: No     Allergies   Fioricet [butalbital-apap-caffeine]; Iodine; Latex; Meloxicam; Meperidine hcl; Morphine; Other; Penicillins; Rosuvastatin; Simvastatin; Sulfonamide derivatives; and Tamiflu [oseltamivir]   Review of Systems Review of Systems  Constitutional: Negative for fever.  Respiratory: Positive for cough.   Cardiovascular: Negative for chest pain.  Gastrointestinal: Positive for vomiting.  Musculoskeletal: Negative for neck pain and neck stiffness.  Skin: Negative for rash and wound.  Allergic/Immunologic: Negative for immunocompromised state.  Neurological: Positive for weakness.  Hematological: Bruises/bleeds easily.     Physical Exam Updated Vital Signs BP (!) 164/97 (BP Location: Left Arm)   Pulse 68   Temp 98.1 F (36.7 C) (Oral)   Resp 18   SpO2 98%   Physical Exam Vitals signs and nursing note reviewed.  Constitutional:      General: She is not in acute distress.    Appearance: Normal appearance. She is well-developed. She is not diaphoretic.  HENT:     Head: Normocephalic and atraumatic.     Right Ear: Tympanic membrane and ear canal normal.     Left Ear: Tympanic membrane and ear canal normal.     Nose: Nose normal.     Mouth/Throat:     Mouth: Mucous membranes are moist.  Eyes:     Conjunctiva/sclera: Conjunctivae normal.  Neck:     Musculoskeletal: Neck supple.  Cardiovascular:     Rate and Rhythm: Normal rate and regular rhythm.     Pulses: Normal pulses.  Pulmonary:     Effort: Pulmonary effort is normal.     Breath sounds: Normal breath sounds.    Abdominal:     Tenderness: There is no abdominal tenderness.  Musculoskeletal:     Right lower leg: No edema.     Left lower leg: No edema.  Lymphadenopathy:     Cervical: No cervical adenopathy.  Skin:    General: Skin is warm and dry.     Findings: No rash.  Neurological:     Mental Status: She is alert and oriented to person, place, and time.  Psychiatric:        Behavior: Behavior normal.      ED Treatments / Results  Labs (all labs ordered are listed, but only abnormal results are displayed) Labs Reviewed  URINE CULTURE - Abnormal; Notable for the following components:      Result Value   Culture >=100,000 COLONIES/mL KLEBSIELLA SPECIES (*)    Organism ID, Bacteria KLEBSIELLA SPECIES (*)  All other components within normal limits  BASIC METABOLIC PANEL - Abnormal; Notable for the following components:   Potassium 3.4 (*)    Glucose, Bld 125 (*)    Creatinine, Ser 1.68 (*)    GFR calc non Af Amer 28 (*)    GFR calc Af Amer 33 (*)    All other components within normal limits  CBC - Abnormal; Notable for the following components:   RBC 5.31 (*)    HCT 47.4 (*)    All other components within normal limits  INFLUENZA PANEL BY PCR (TYPE A & B) - Abnormal; Notable for the following components:   Influenza A By PCR POSITIVE (*)    All other components within normal limits  URINALYSIS, ROUTINE W REFLEX MICROSCOPIC - Abnormal; Notable for the following components:   Color, Urine AMBER (*)    APPearance CLOUDY (*)    Protein, ur 30 (*)    Leukocytes,Ua LARGE (*)    Bacteria, UA FEW (*)    All other components within normal limits  BASIC METABOLIC PANEL - Abnormal; Notable for the following components:   Chloride 113 (*)    Creatinine, Ser 1.23 (*)    Calcium 7.7 (*)    GFR calc non Af Amer 41 (*)    GFR calc Af Amer 48 (*)    All other components within normal limits  CBC    EKG EKG Interpretation  Date/Time:  Friday October 01 2018 11:07:35  EST Ventricular Rate:  101 PR Interval:  150 QRS Duration: 92 QT Interval:  366 QTC Calculation: 474 R Axis:   64 Text Interpretation:  Sinus tachycardia Otherwise normal ECG Confirmed by Lennice Sites [phone]) on 10/01/2018 2:43:34 PM   Radiology No results found.  Procedures Procedures (including critical care time)  Medications Ordered in ED Medications  sodium chloride 0.9 % bolus 500 mL (0 mLs Intravenous Stopped 10/01/18 1335)  acetaminophen (TYLENOL) tablet 650 mg (650 mg Oral Given 10/01/18 1404)  sodium chloride 0.9 % bolus 1,000 mL (0 mLs Intravenous Stopped 10/01/18 1650)  potassium chloride SA (K-DUR,KLOR-CON) CR tablet 40 mEq (40 mEq Oral Given 10/02/18 0840)  magnesium oxide (MAG-OX) tablet 400 mg (400 mg Oral Given 10/02/18 0840)     Initial Impression / Assessment and Plan / ED Course  I have reviewed the triage vital signs and the nursing notes.  Pertinent labs & imaging results that were available during my care of the patient were reviewed by me and considered in my medical decision making (see chart for details).  Clinical Course as of Oct 26 846  Fri Oct 01, 2025  6513 83 year old female presents with complaint of not feeling well today. Exposed to flu 1 week ago, took Tamiflu but states that this caused her to have vomiting and feel unwell.  Review of lab work shows BUN with increasing creatinine from 0.99 to now 1.68.  Urinalysis with 30 protein, large leukocytes however contaminated with 6-10 epithelials.  CBC with normal white count.  Patient is influenza A positive.  Chest x-ray is unremarkable. Patient was given IV fluids, continues to feel unwell. Case discussed with Dr. Ronnald Nian, ER attending who has seen the patient, recommends consult hospitalist for admission for Influenza and dehydration.    [LM]  1409 Case discussed with hospitalist who will consult for admission.    [LM]    Clinical Course User Index [LM] Tacy Learn, PA-C   Final Clinical  Impressions(s) / ED Diagnoses   Final diagnoses:  Influenza A  Dehydration    ED Discharge Orders         Ordered    Increase activity slowly     10/02/18 1228    Diet general     10/02/18 1228    Discharge instructions    Comments:  Home health   10/02/18 [place], PA-C 10/01/18 1409    Lennice Sites, DO 10/01/18 1710    Tacy Learn, PA-C 10/26/18 0848    Lennice Sites, DO 10/26/18 1459

## 2018-10-01 NOTE — ED Triage Notes (Signed)
Pt presents for evaluation of weakness and dizziness intermittently for multiple months. Reports has had recurrent nasal congestion and productive cough x 3 episodes since December with most recent starting 2 weeks ago. Pt reports fall from dizziness today, no LOC. Pt has bruising to R wrist.

## 2018-10-02 DIAGNOSIS — I951 Orthostatic hypotension: Secondary | ICD-10-CM | POA: Diagnosis not present

## 2018-10-02 LAB — BASIC METABOLIC PANEL
Anion gap: 6 (ref 5–15)
BUN: 19 mg/dL (ref 8–23)
CO2: 23 mmol/L (ref 22–32)
CREATININE: 1.23 mg/dL — AB (ref 0.44–1.00)
Calcium: 7.7 mg/dL — ABNORMAL LOW (ref 8.9–10.3)
Chloride: 113 mmol/L — ABNORMAL HIGH (ref 98–111)
GFR calc Af Amer: 48 mL/min — ABNORMAL LOW (ref >60)
GFR calc non Af Amer: 41 mL/min — ABNORMAL LOW (ref >60)
Glucose, Bld: 90 mg/dL (ref 70–99)
Potassium: 3.6 mmol/L (ref 3.5–5.1)
Sodium: 142 mmol/L (ref 135–145)

## 2018-10-02 LAB — CBC
HCT: 37.9 % (ref 36.0–46.0)
Hemoglobin: 12.1 g/dL (ref 12.0–15.0)
MCH: 28.7 pg (ref 26.0–34.0)
MCHC: 31.9 g/dL (ref 30.0–36.0)
MCV: 90 fL (ref 80.0–100.0)
Platelets: 153 10*3/uL (ref 150–400)
RBC: 4.21 MIL/uL (ref 3.87–5.11)
RDW: 14.1 % (ref 11.5–15.5)
WBC: 5.7 10*3/uL (ref 4.0–10.5)
nRBC: 0 % (ref 0.0–0.2)

## 2018-10-02 MED ORDER — ASPIRIN-ACETAMINOPHEN-CAFFEINE 250-250-65 MG PO TABS
2.0000 | ORAL_TABLET | Freq: Four times a day (QID) | ORAL | Status: DC | PRN
  Administered 2018-10-02: 2 via ORAL
  Filled 2018-10-02 (×2): qty 2

## 2018-10-02 NOTE — Care Management Note (Signed)
Case Management Note  Patient Details  Name: Cindy Duran MRN: [id number] Date of Birth: 06/03/1936  Subjective/Objective:                    Action/Plan:  Spoke w patient at bedside, she lives athome with her daughter and SIL, she has a rollator at home. She is agreeable to [facility] for [facility] from the medicare quality list. Referral accepted by [facility], clinical liaision, no other CM needs identified.  Expected Discharge Date:  10/02/18               Expected Discharge Plan:  [place]  In-House Referral:     Discharge planning Services  CM Consult  Post Acute Care Choice:  Home Health Choice offered to:  Patient  DME Arranged:    DME Agency:     HH Arranged:  PT [place]:  [place]  Status of Service:  Completed, signed off  If discussed at [place] of Stay Meetings, dates discussed:    Additional Comments:  Carles Collet, RN 10/02/2018, 1:58 PM

## 2018-10-02 NOTE — Progress Notes (Signed)
Case manager aware of pt discharge with home health

## 2018-10-02 NOTE — Discharge Summary (Signed)
Physician Discharge Summary  Cindy Duran QGB:[id number] DOB: [birth year] DOA: 10/01/2018  PCP: Chipper Herb, MD  Admit date: 10/01/2018 Discharge date: 10/02/2018  Admitted From: home Discharge disposition: home   Recommendations for Outpatient Follow-Up:   1. Home health 2. BMP 1 week 3. Orthostatic hypotension precuations   Discharge Diagnosis:   Active Problems:   Orthostatic hypotension   AKI (acute kidney injury) ([place])   Hypokalemia    Discharge Condition: Improved.  Diet recommendation:Regular.  Wound care: None.  Code status: dnr   History of Present Illness:   Cindy Duran is a 83 y.o. female past medical history of a TIA, recently treated empirically for influenza on 09/24/2018 (as her grandson was diagnosed with the flu) which she could not complete the treatment due to severe nausea, since then has had  poor oral intake, anorexia and a productive cough.  Comes into the hospital for dizziness upon standing.  She relates that for the last week she has been feeling sluggish and fatigue but 2 days prior to admission she started getting dizzy upon standing.  She relates on the morning of admission she went to fix her breakfast and she fell to the floor did not hit her head did not lose consciousness.  She denies no further nausea or diarrhea, she denies any fever.  But she continues to have a productive cough, no emesis.  She denies any chest pain or shortness of breath.  In the ED: Blood pressure stable, she remains afebrile mild hypokalemia no leukocytosis influenza PCR is positive and UA shows no signs of infection.   Hospital Course by Problem:   Orthostatic hypotension: She is on no antihypertensive medications at home. Likely due to increase insensible losses with decrease oral intake. Improved with  IVF -patient with long-term issues-- long discussion about TED hose/getting up slowly with pauses  -patient feeling much better and wants to go  home  AKI (acute kidney injury) ([place]) From prerenal azotemia. Cr slowly improving -encouraged PO intake   Hypokalemia repleted  Influenza PCR positive: She was initially treated with Tamiflu on 09/24/2018 she  took tamiflu for 3 days and she could not tolerate it due to nausea which probably contributed to her AKI       Medical Consultants:      Discharge Exam:   Vitals:   10/01/18 2346 10/02/18 0611  BP: (!) 163/90 (!) 146/101  Pulse: 73 74  Resp: 16   Temp: 97.7 F (36.5 C) (!) 97.4 F (36.3 C)  SpO2: 95% 100%   Vitals:   10/01/18 1644 10/01/18 1754 10/01/18 2346 10/02/18 0611  BP:  129/84 (!) 163/90 (!) 146/101  Pulse:  80 73 74  Resp:  16 16   Temp:  97.9 F (36.6 C) 97.7 F (36.5 C) (!) 97.4 F (36.3 C)  TempSrc:  Oral Oral Oral  SpO2: 98% 95% 95% 100%    General exam: Appears calm and comfortable.  The results of significant diagnostics from this hospitalization (including imaging, microbiology, ancillary and laboratory) are listed below for reference.     Procedures and Diagnostic Studies:   Dg Chest Port 1 View  Result Date: 10/01/2018 CLINICAL DATA:  Cough. The patient fell today secondary to dizziness. EXAM: PORTABLE CHEST 1 VIEW COMPARISON:  07/15/2016 FINDINGS: The heart size and pulmonary vascularity are normal and the lungs are clear. No acute bone abnormality. Old healed fracture of the proximal right humerus treated with open reduction and internal  fixation. Arthritic changes at the right shoulder. IMPRESSION: No acute abnormalities. Electronically Signed   By: Lorriane Shire M.D.   On: 10/01/2018 12:13     Labs:   Basic Metabolic Panel: Recent Labs  Lab 10/01/18 1117 10/02/18 0531  NA 142 142  K 3.4* 3.6  CL 107 113*  CO2 25 23  GLUCOSE 125* 90  BUN 18 19  CREATININE 1.68* 1.23*  CALCIUM 9.2 7.7*   GFR CrCl cannot be calculated (Unknown ideal weight.). Liver Function Tests: No results for input(s): AST, ALT, ALKPHOS,  BILITOT, PROT, ALBUMIN in the last 168 hours. No results for input(s): LIPASE, AMYLASE in the last 168 hours. No results for input(s): AMMONIA in the last 168 hours. Coagulation profile No results for input(s): INR, PROTIME in the last 168 hours.  CBC: Recent Labs  Lab 10/01/18 1117 10/02/18 0531  WBC 6.3 5.7  HGB 14.8 12.1  HCT 47.4* 37.9  MCV 89.3 90.0  PLT 201 153   Cardiac Enzymes: No results for input(s): CKTOTAL, CKMB, CKMBINDEX, TROPONINI in the last 168 hours. BNP: Invalid input(s): POCBNP CBG: No results for input(s): GLUCAP in the last 168 hours. D-Dimer No results for input(s): DDIMER in the last 72 hours. Hgb A1c No results for input(s): HGBA1C in the last 72 hours. Lipid Profile No results for input(s): CHOL, HDL, LDLCALC, TRIG, CHOLHDL, LDLDIRECT in the last 72 hours. Thyroid function studies No results for input(s): TSH, T4TOTAL, T3FREE, THYROIDAB in the last 72 hours.  Invalid input(s): FREET3 Anemia work up No results for input(s): VITAMINB12, FOLATE, FERRITIN, TIBC, IRON, RETICCTPCT in the last 72 hours. Microbiology Recent Results (from the past 240 hour(s))  Urine culture     Status: Abnormal (Preliminary result)   Collection Time: 10/01/18 12:15 PM  Result Value Ref Range Status   Specimen Description URINE, RANDOM  Final   Special Requests   Final    NONE Performed at [facility], [address], [place], [place] [address]    Culture >=100,000 COLONIES/mL GRAM NEGATIVE RODS (A)  Final   Report Status PENDING  Incomplete     Discharge Instructions:   Discharge Instructions    Diet general   Complete by:  As directed    Discharge instructions   Complete by:  As directed    Home health   Increase activity slowly   Complete by:  As directed      Allergies as of 10/02/2018      Reactions   Fioricet [butalbital-apap-caffeine] Other (See Comments)   hallucinations   Iodine Other (See Comments)   "skin comes off"   Latex Other  (See Comments)   Skin peeling off   Meloxicam Other (See Comments)   Pt can't remember rxn   Meperidine Hcl Nausea And Vomiting   Morphine Nausea And Vomiting   Other Other (See Comments)   Clorox Bleach   Penicillins Hives   Did it involve swelling of the face/tongue/throat, SOB, or low BP? Unknown Did it involve sudden or severe rash/hives, skin peeling, or any reaction on the inside of your mouth or nose? Unknown Did you need to seek medical attention at a hospital or doctor's office? Unknown When did it last happen?childhood If all above answers are "NO", may proceed with cephalosporin use.   Rosuvastatin Other (See Comments)   Pt can't remember rxn   Simvastatin Other (See Comments)   Pt can't remember rxn   Sulfonamide Derivatives Swelling   Tamiflu [oseltamivir] Diarrhea  Severe, requiring hospitalization.      Medication List    STOP taking these medications   ondansetron 4 MG tablet Commonly known as:  ZOFRAN   traMADol 50 MG tablet Commonly known as:  ULTRAM     TAKE these medications   aspirin-acetaminophen-caffeine 250-250-65 MG tablet Commonly known as:  EXCEDRIN MIGRAINE Take 2 tablets by mouth every 6 (six) hours as needed for headache or migraine.   atorvastatin 20 MG tablet Commonly known as:  LIPITOR TAKE 1 TABLET BY MOUTH ONCE DAILY What changed:  when to take this   clopidogrel 75 MG tablet Commonly known as:  PLAVIX TAKE 1 TABLET BY MOUTH ONCE DAILY   levothyroxine 125 MCG tablet Commonly known as:  SYNTHROID, LEVOTHROID Take 1 tablet (125 mcg total) by mouth daily.   Vitamin D-3 125 MCG (5000 UT) Tabs Take 1 capsule by mouth See admin instructions. Take 1 capsule once daily by mouth Monday thru Friday         Time coordinating discharge: 25 min  Signed:  Geradine Girt DO  [facility] 10/02/2018, 12:29 PM

## 2018-10-02 NOTE — Evaluation (Signed)
Physical Therapy Evaluation Patient Details Name: Cindy Duran MRN: [id number] DOB: 26-Oct-1935 Today's Date: 10/02/2018   History of Present Illness  Pt is a 83 y.o. F with significant PMH of TIA who was recently treated for influenza on 2/7 and could not complete treatment due to severe nausea. Presents to hospital with dizziness upon standing and fall.  Clinical Impression  Pt admitted with above. Currently presenting with decreased mobility secondary to generalized weakness, abnormal posture, and balance impairments. Ambulating 100 feet with no assistive device and min guard assistance. Pt with positive orthostatic hypotension (see vitals flowsheet) but is asymptomatic. Gait speed of 1.02 ft/s in addition to history of falling is indicative of high recurrent fall risk. Recommend HHPT at discharge to maximize functional independence.     Follow Up Recommendations Home health PT;Supervision for mobility/OOB    Equipment Recommendations  None recommended by PT    Recommendations for Other Services OT consult     Precautions / Restrictions Precautions Precautions: Fall Restrictions Weight Bearing Restrictions: No      Mobility  Bed Mobility Overal bed mobility: Modified Independent             General bed mobility comments: HOB up  Transfers Overall transfer level: Needs assistance Equipment used: None Transfers: Sit to/from Stand Sit to Stand: Min guard            Ambulation/Gait Ambulation/Gait assistance: Min guard Gait Distance (Feet): 100 Feet Assistive device: None Gait Pattern/deviations: Step-through pattern;Decreased stride length;Trunk flexed Gait velocity: 1.02 ft/s Gait velocity interpretation: <1.31 ft/sec, indicative of household ambulator General Gait Details: Pt with slow cadence, trunk flexed posture throughout and mildly unsteady  Stairs            Wheelchair Mobility    Modified Rankin (Stroke Patients Only)       Balance  Overall balance assessment: Mild deficits observed, not formally tested                                           Pertinent Vitals/Pain Pain Assessment: No/denies pain    Home Living Family/patient expects to be discharged to:: Private residence Living Arrangements: Children(daughter, son in Sports [employer]) Available Help at Discharge: Family Type of Home: House Home Access: Stairs to enter Entrance Stairs-Rails: Right Entrance Stairs-Number of Steps: 5 Home Layout: One level Home Equipment: Environmental consultant - 4 wheels      Prior Function Level of Independence: Needs assistance   Gait / Transfers Assistance Needed: uses Rollator for community ambulation  ADL's / Homemaking Assistance Needed: supervision for bathing, requires assist for IADL's  Comments: history of 2 falls recently      Hand Dominance        Extremity/Trunk Assessment   Upper Extremity Assessment Upper Extremity Assessment: Generalized weakness    Lower Extremity Assessment Lower Extremity Assessment: Generalized weakness    Cervical / Trunk Assessment Cervical / Trunk Assessment: Kyphotic  Communication   Communication: No difficulties  Cognition Arousal/Alertness: Awake/alert Behavior During Therapy: WFL for tasks assessed/performed Overall Cognitive Status: Within Functional Limits for tasks assessed                                        General Comments      Exercises     Assessment/Plan    PT  Assessment Patient needs continued PT services  PT Problem List Decreased strength;Decreased activity tolerance;Decreased balance;Decreased mobility       PT Treatment Interventions DME instruction;Gait training;Stair training;Therapeutic activities;Functional mobility training;Therapeutic exercise;Balance training;Patient/family education    PT Goals (Current goals can be found in the Care Plan section)  Acute Rehab PT Goals Patient Stated Goal: "not fall." PT Goal  Formulation: With patient Time For Goal Achievement: 10/16/18 Potential to Achieve Goals: Good    Frequency Min 3X/week   Barriers to discharge        Co-evaluation               AM-PAC PT "6 Clicks" Mobility  Outcome Measure Help needed turning from your back to your side while in a flat bed without using bedrails?: None Help needed moving from lying on your back to sitting on the side of a flat bed without using bedrails?: A Little Help needed moving to and from a bed to a chair (including a wheelchair)?: A Little Help needed standing up from a chair using your arms (e.g., wheelchair or bedside chair)?: A Little Help needed to walk in hospital room?: A Little Help needed climbing 3-5 steps with a railing? : A Lot 6 Click Score: 18    End of Session Equipment Utilized During Treatment: Gait belt Activity Tolerance: Patient tolerated treatment well Patient left: in bed;with call bell/phone within reach;with family/visitor present Nurse Communication: Other (comment)(vitals) PT Visit Diagnosis: Unsteadiness on feet (R26.81);History of falling (Z91.81);Muscle weakness (generalized) (M62.81);Dizziness and giddiness (R42)    Time: 4098-1191 PT Time Calculation (min) (ACUTE ONLY): 22 min   Charges:   PT Evaluation $PT Eval Moderate Complexity: 1 Mod         Ellamae Sia, [place], DPT Acute Rehabilitation Services Pager [phone] Office [phone]   Willy Eddy 10/02/2018, 9:12 AM

## 2018-10-02 NOTE — Progress Notes (Signed)
Pt discharge education provided at bedside with pt and pt daughter  Pt has all belongings  Pt seen by Case manager Pt IV removed by Nira Conn RN  Pt discharged via wheelchair with NT

## 2018-10-03 LAB — URINE CULTURE: Culture: >=100000 — AB

## 2018-10-05 ENCOUNTER — Other Ambulatory Visit: Payer: Self-pay | Admitting: Physician Assistant

## 2018-10-06 DIAGNOSIS — I951 Orthostatic hypotension: Secondary | ICD-10-CM | POA: Diagnosis not present

## 2018-10-06 DIAGNOSIS — Z8673 Personal history of transient ischemic attack (TIA), and cerebral infarction without residual deficits: Secondary | ICD-10-CM | POA: Diagnosis not present

## 2018-10-06 DIAGNOSIS — E785 Hyperlipidemia, unspecified: Secondary | ICD-10-CM | POA: Diagnosis not present

## 2018-10-06 DIAGNOSIS — G43119 Migraine with aura, intractable, without status migrainosus: Secondary | ICD-10-CM | POA: Diagnosis not present

## 2018-10-06 DIAGNOSIS — E039 Hypothyroidism, unspecified: Secondary | ICD-10-CM | POA: Diagnosis not present

## 2018-10-06 DIAGNOSIS — Z7902 Long term (current) use of antithrombotics/antiplatelets: Secondary | ICD-10-CM | POA: Diagnosis not present

## 2018-10-06 DIAGNOSIS — I10 Essential (primary) hypertension: Secondary | ICD-10-CM | POA: Diagnosis not present

## 2018-10-11 ENCOUNTER — Other Ambulatory Visit: Payer: Self-pay

## 2018-10-11 DIAGNOSIS — Z87440 Personal history of urinary (tract) infections: Secondary | ICD-10-CM

## 2018-10-11 MED ORDER — NITROFURANTOIN MONOHYD MACRO 100 MG PO CAPS: 100.0000 mg | ORAL_CAPSULE | Freq: Two times a day (BID) | ORAL | 0 refills | Status: DC

## 2018-10-12 ENCOUNTER — Other Ambulatory Visit: Payer: Self-pay | Admitting: *Deleted

## 2018-10-12 DIAGNOSIS — M545 Low back pain, unspecified: Secondary | ICD-10-CM

## 2018-10-14 ENCOUNTER — Ambulatory Visit: Payer: Medicare Other

## 2018-10-22 ENCOUNTER — Ambulatory Visit (INDEPENDENT_AMBULATORY_CARE_PROVIDER_SITE_OTHER): Payer: Medicare Other

## 2018-10-22 DIAGNOSIS — J111 Influenza due to unidentified influenza virus with other respiratory manifestations: Secondary | ICD-10-CM | POA: Diagnosis not present

## 2018-10-22 DIAGNOSIS — I10 Essential (primary) hypertension: Secondary | ICD-10-CM

## 2018-10-22 DIAGNOSIS — I951 Orthostatic hypotension: Secondary | ICD-10-CM

## 2018-10-22 DIAGNOSIS — E785 Hyperlipidemia, unspecified: Secondary | ICD-10-CM | POA: Diagnosis not present

## 2018-10-22 DIAGNOSIS — Z7902 Long term (current) use of antithrombotics/antiplatelets: Secondary | ICD-10-CM

## 2018-10-22 DIAGNOSIS — G43119 Migraine with aura, intractable, without status migrainosus: Secondary | ICD-10-CM | POA: Diagnosis not present

## 2018-10-22 DIAGNOSIS — E039 Hypothyroidism, unspecified: Secondary | ICD-10-CM

## 2018-10-22 DIAGNOSIS — Z8673 Personal history of transient ischemic attack (TIA), and cerebral infarction without residual deficits: Secondary | ICD-10-CM

## 2018-11-09 ENCOUNTER — Telehealth: Payer: Self-pay | Admitting: Family Medicine

## 2018-11-09 NOTE — Telephone Encounter (Signed)
Last shot lasted 2 mos.  Was given by [facility]MMs nurse = was depo med.  virt appt made for tomorrow with [facility].

## 2018-11-10 ENCOUNTER — Other Ambulatory Visit: Payer: Self-pay

## 2018-11-10 ENCOUNTER — Telehealth (INDEPENDENT_AMBULATORY_CARE_PROVIDER_SITE_OTHER): Payer: Medicare Other | Admitting: Family Medicine

## 2018-11-10 DIAGNOSIS — M159 Polyosteoarthritis, unspecified: Secondary | ICD-10-CM

## 2018-11-10 DIAGNOSIS — M150 Primary generalized (osteo)arthritis: Secondary | ICD-10-CM

## 2018-11-10 DIAGNOSIS — M545 Low back pain, unspecified: Secondary | ICD-10-CM

## 2018-11-10 DIAGNOSIS — M25551 Pain in right hip: Secondary | ICD-10-CM | POA: Diagnosis not present

## 2018-11-10 MED ORDER — METHYLPREDNISOLONE ACETATE 80 MG/ML IJ SUSP
60.0000 mg | Freq: Once | INTRAMUSCULAR | Status: AC
  Administered 2018-11-10: 60 mg via INTRAMUSCULAR

## 2018-11-10 NOTE — Progress Notes (Signed)
   Virtual Visit via telephone Note  I connected with Cindy Duran on 11/10/18 at  10:55 AM by telephone and verified that I am speaking with the correct person using two identifiers. Cindy Duran is currently located at home and her granddaughter is currently with her during visit. The provider, Redge Gainer, MD is located in their office at time of visit.  I discussed the limitations, risks, security and privacy concerns of performing an evaluation and management service by telephone and the availability of in person appointments. I also discussed with the patient that there may be a patient responsible charge related to this service. The patient expressed understanding and agreed to proceed.   History and Present Illness:  The patient calls because she has her ongoing right hip and right low back pain.  She did receive an injection in the past 2 to 3 months.  Since that time she did have a fall but did not injure her hip any further at that time just her arm and was seen in the emergency room.  She was told that her blood pressure is dropping when she stands up and this is what contributed to her fall.  She is now more conscious about using her walker and being more careful to prevent falls from sitting to standing.  The hip pain and right low back pain that she is having is an ongoing problem and says that Depo-Medrol does give her some relief from this and may last for 2 to 3 months and that is what she is requesting today.  She denies any other symptoms or complaints.  All her meds were reviewed and she continues to use the same pharmacy.       Observations/Objective: The patient is alert and the history is compatible with ongoing osteoarthritis in her back and hip.  She denies any other complaints or symptoms with her stomach including nausea vomiting diarrhea blood in the stool chest pain shortness of breath or trouble passing her water.  Assessment and Plan: Low back pain Primary  osteoarthritis involving multiple joints Right hip pain--- 60 of Depo-Medrol to be injected into right hip  Follow Up Instructions: Continue to be careful and not put herself at risk for falling Use walker regularly Continue to drink plenty of water Practice good hand and pulmonary hygiene    I discussed the assessment and treatment plan with the patient. The patient was provided an opportunity to ask questions and all were answered. The patient agreed with the plan and demonstrated an understanding of the instructions.   The patient was advised to call back or seek an in-person evaluation if the symptoms worsen or if the condition fails to improve as anticipated.  The above assessment and management plan was discussed with the patient. The patient verbalized understanding of and has agreed to the management plan. Patient is aware to call the clinic if symptoms persist or worsen. Patient is aware when to return to the clinic for a follow-up visit. Patient educated on when it is appropriate to go to the emergency department.    I provided 11 minutes minutes of non-face-to-face time during this encounter.    Redge Gainer, MD

## 2018-11-19 ENCOUNTER — Other Ambulatory Visit: Payer: Self-pay | Admitting: Family Medicine

## 2018-11-19 ENCOUNTER — Other Ambulatory Visit: Payer: Self-pay | Admitting: Nurse Practitioner

## 2018-11-22 ENCOUNTER — Other Ambulatory Visit: Payer: Self-pay | Admitting: Family Medicine

## 2018-12-15 ENCOUNTER — Telehealth: Payer: Self-pay | Admitting: Family Medicine

## 2018-12-20 ENCOUNTER — Other Ambulatory Visit: Payer: Self-pay

## 2018-12-20 ENCOUNTER — Ambulatory Visit (INDEPENDENT_AMBULATORY_CARE_PROVIDER_SITE_OTHER): Payer: Medicare Other | Admitting: Family Medicine

## 2018-12-20 DIAGNOSIS — M150 Primary generalized (osteo)arthritis: Secondary | ICD-10-CM

## 2018-12-20 DIAGNOSIS — R002 Palpitations: Secondary | ICD-10-CM

## 2018-12-20 DIAGNOSIS — E7800 Pure hypercholesterolemia, unspecified: Secondary | ICD-10-CM

## 2018-12-20 DIAGNOSIS — I499 Cardiac arrhythmia, unspecified: Secondary | ICD-10-CM

## 2018-12-20 DIAGNOSIS — R1319 Other dysphagia: Secondary | ICD-10-CM

## 2018-12-20 DIAGNOSIS — E559 Vitamin D deficiency, unspecified: Secondary | ICD-10-CM | POA: Diagnosis not present

## 2018-12-20 DIAGNOSIS — E538 Deficiency of other specified B group vitamins: Secondary | ICD-10-CM

## 2018-12-20 DIAGNOSIS — R2681 Unsteadiness on feet: Secondary | ICD-10-CM | POA: Diagnosis not present

## 2018-12-20 DIAGNOSIS — M159 Polyosteoarthritis, unspecified: Secondary | ICD-10-CM

## 2018-12-20 DIAGNOSIS — R1310 Dysphagia, unspecified: Secondary | ICD-10-CM

## 2018-12-20 DIAGNOSIS — I10 Essential (primary) hypertension: Secondary | ICD-10-CM

## 2018-12-20 NOTE — Progress Notes (Signed)
Virtual Visit Via telephone Note I connected with@ on 12/20/18 by telephone and verified that I am speaking with the correct person or authorized healthcare agent using two identifiers. Cindy Duran is currently located at home and there are no unauthorized people in close proximity. I completed this visit while in a private location in my home .  I connected to the patient by telephone and verified that I was speaking with the correct person.  This visit type was conducted due to national recommendations for restrictions regarding the COVID-19 Pandemic (e.g. social distancing).  This format is felt to be most appropriate for this patient at this time.  All issues noted in this document were discussed and addressed.  No physical exam was performed.    I discussed the limitations, risks, security and privacy concerns of performing an evaluation and management service by telephone and the availability of in person appointments. I also discussed with the patient that there may be a patient responsible charge related to this service. The patient expressed understanding and agreed to proceed.   Date:  12/20/2018    ID:  [name]      1935/11/09        [id number]   Patient Care Team Patient Care Team: Chipper Herb, MD as PCP - General (Family Medicine)  Reason for Visit: Primary Care Follow-up     History of Present Illness & Review of Systems:     Cindy Duran is a 83 y.o. year old female primary care patient that presents today for a telehealth visit.  Patient is pleasant and doing well.  She is pretty much confined to her home and has only been out in the car one time.  Her daughter and granddaughter and son-in-law are very attentive to her needs and cares at home.  She uses a walker around the home.  She denies any chest pain pressure or tightness.  She does have some shortness of breath with her heart fluttering and this only last for no more than 10 minutes.  It often occurs after  using the bathroom.  She is all ways careful to make sure that she rest after being in the bathroom so she does not get lightheaded and fall because of the heart racing.  She has seen the cardiologist in the past but does not see him regularly.  She is doing well with her stomach but is having problems with swallowing and she feels like she is choking at times and this is getting worse.  She also has some constipation.  She denies any blood in the stool or black tarry bowel movements.  We did recommend Colace and MiraLAX for the constipation.  We did suggest that she should see the gastroenterologist and will make an appointment for her to see him because of the trouble with swallowing and choking that is getting worse.  She is passing her water without burning but does have ongoing frequency which has not changed.  As mentioned she uses her walker around the home and has not fallen in a good while.  She has not had any B12 injections in a long period of time either since November 2019.  Review of systems as stated otherwise negative for body systems left unmentioned.   The patient does not have symptoms concerning for COVID-19 infection (fever, chills, cough, or new shortness of breath).      Current Medications (Verified) Allergies as of 12/20/2018  Reactions   Fioricet [butalbital-apap-caffeine] Other (See Comments)   hallucinations   Iodine Other (See Comments)   "skin comes off"   Latex Other (See Comments)   Skin peeling off   Meloxicam Other (See Comments)   Pt can't remember rxn   Meperidine Hcl Nausea And Vomiting   Morphine Nausea And Vomiting   Other Other (See Comments)   Clorox Bleach   Penicillins Hives   Did it involve swelling of the face/tongue/throat, SOB, or low BP? Unknown Did it involve sudden or severe rash/hives, skin peeling, or any reaction on the inside of your mouth or nose? Unknown Did you need to seek medical attention at a hospital or doctor's office? Unknown  When did it last happen?childhood If all above answers are "NO", may proceed with cephalosporin use.   Rosuvastatin Other (See Comments)   Pt can't remember rxn   Simvastatin Other (See Comments)   Pt can't remember rxn   Sulfonamide Derivatives Swelling   Tamiflu [oseltamivir] Diarrhea   Severe, requiring hospitalization.      Medication List       Accurate as of Dec 20, 2018  8:43 AM. Always use your most recent med list.        aspirin-acetaminophen-caffeine 250-250-65 MG tablet Commonly known as:  EXCEDRIN MIGRAINE Take 2 tablets by mouth every 6 (six) hours as needed for headache or migraine.   atorvastatin 20 MG tablet Commonly known as:  LIPITOR Take 1 tablet by mouth once daily   clopidogrel 75 MG tablet Commonly known as:  PLAVIX Take 1 tablet by mouth once daily   Synthroid 125 MCG tablet Generic drug:  levothyroxine Take 1 tablet (125 mcg total) by mouth daily. (need repeat labwork)   Vitamin D-3 125 MCG (5000 UT) Tabs Take 1 capsule by mouth See admin instructions. Take 1 capsule once daily by mouth Monday thru Friday           Allergies (Verified)    Fioricet [butalbital-apap-caffeine]; Iodine; Latex; Meloxicam; Meperidine hcl; Morphine; Other; Penicillins; Rosuvastatin; Simvastatin; Sulfonamide derivatives; and Tamiflu [oseltamivir]  Past Medical History Past Medical History:  Diagnosis Date  . Classical migraine with intractable migraine 12/03/2015  . Essential hypertension   . Hyperlipidemia   . Hypothyroidism   . Migraine   . TIA (transient ischemic attack)      Past Surgical History:  Procedure Laterality Date  . ABDOMINAL HYSTERECTOMY    . APPENDECTOMY    . ROTATOR CUFF REPAIR    . SHOULDER SURGERY      Social History   Socioeconomic History  . Marital status: Widowed    Spouse name: Not on file  . Number of children: 3  . Years of education: [employer]  . Highest education level: Not on file  Occupational History  .  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 Financial resource strain: Not hard at all  . Food insecurity:    Worry: Never true    Inability: Never true  . Transportation needs:    Medical: No    Non-medical: No  Tobacco Use  . Smoking status: Never Smoker  . Smokeless tobacco: Never Used  Substance and Sexual Activity  . Alcohol use: No  . Drug use: No  . Sexual activity: Never  Lifestyle  . Physical activity:    Days per week: 0 days    Minutes per session: 0 min  . Stress: Only a little  Relationships  . Social  connections:    Talks on phone: More than three times a week    Gets together: More than three times a week    Attends religious service: More than 4 times per year    Active member of club or organization: Yes    Attends meetings of clubs or organizations: More than 4 times per year    Relationship status: Widowed  Other Topics Concern  . Not on file  Social History Narrative   Patient lives with daughters.   Right-handed.   Caffeine Use: 1.5 Mountain Dew 12oz daily     Family History  Problem Relation Age of Onset  . Stroke Mother   . Alzheimer's disease Mother   . Osteoporosis Mother        Broken hip  . Stroke Father   . Heart attack Brother   . Heart attack Maternal Aunt   . Stroke Maternal Aunt   . Thyroid disease Daughter   . Dementia Sister 51      Labs/Other Tests and Data Reviewed:    Wt Readings from Last 3 Encounters:  09/10/18 149 lb (67.6 kg)  07/02/18 159 lb (72.1 kg)  05/17/18 159 lb (72.1 kg)   Temp Readings from Last 3 Encounters:  10/02/18 98.1 F (36.7 C) (Oral)  09/10/18 97.6 F (36.4 C) (Oral)  07/02/18 (!) 96.8 F (36 C) (Oral)   BP Readings from Last 3 Encounters:  10/02/18 (!) 164/97  09/10/18 (!) 178/97  07/02/18 130/79   Pulse Readings from Last 3 Encounters:  10/02/18 68  09/10/18 85  07/02/18 77     Lab Results  Component Value Date   HGBA1C 5.6 11/08/2015   HGBA1C  5.1 06/15/2014   HGBA1C 5.5 04/14/2013   Lab Results  Component Value Date   LDLCALC 58 10/09/2017   CREATININE 1.23 (H) 10/02/2018       Chemistry      Component Value Date/Time   NA 142 10/02/2018 0531   NA 146 (H) 05/12/2018 1521   K 3.6 10/02/2018 0531   CL 113 (H) 10/02/2018 0531   CO2 23 10/02/2018 0531   BUN 19 10/02/2018 0531   BUN 15 05/12/2018 1521   CREATININE 1.23 (H) 10/02/2018 0531      Component Value Date/Time   CALCIUM 7.7 (L) 10/02/2018 0531   ALKPHOS 76 05/12/2018 1521   AST 17 05/12/2018 1521   ALT 12 05/12/2018 1521   BILITOT 0.5 05/12/2018 1521         OBSERVATIONS/ OBJECTIVE:     The patient was alert and responded to all questions asked of her.  She does not check blood pressures at home and her last weight was 149.  She has had minimal lightheaded spells.  This usually occurs after bowel movements and may last for 10 minutes and usually is associated with her heart fluttering.  She is due to get lab work and a B12 injection.  We will try to get this in the next 3 to 4 weeks and will have the nurse call her and arrange her to come in to get the lab work and get a B12 shot at the same time.  Physical exam deferred due to nature of telephonic visit.  ASSESSMENT & PLAN    Time:   Today, I have spent35 minutes with the patient via telephone discussing the above including Covid precautions.     Visit Diagnoses: 1. Cardiac arrhythmia, unspecified cardiac arrhythmia type -Patient follows up with cardiology as needed  2. B12 deficiency -She has not had any B12 injections since November 2019.  She will take B12 by mouth but I did encourage her to start B12 injections again and to get 1 of her relatives to give her B12 shot and she is open to doing that.  When she comes in for getting her lab work we will give her a B12 injection then  3. Primary osteoarthritis involving multiple joints -Continue to use walker and take Tylenol as needed  4. Vitamin  D deficiency -Continue vitamin D replacement pending results of lab work  5. Gait instability -Use walker as directed  6. HYPERTENSION, BENIGN -Check blood pressures periodically and especially when lightheaded.  7. Pure hypercholesterolemia -Continue with atorvastatin pending results of lab work  8. Palpitation -Follow-up with cardiology as planned  9.  Trouble swallowing -Schedule patient for visit with gastroenterologist to further evaluate this since it seems to be progressive.  Patient will come by the office in about 4 weeks for lab work and B12 injection  Patient Instructions  The patient will continue to take her current medicines She will come by the office at a convenient time to get her blood work and will call before she comes to make sure that it is an appropriate time and a safe time for her to come. She will continue to drink plenty of water and stay well-hydrated She will use her walker and wheelchair at home and make every effort to not put herself at risk for falling She will call the office if she needs refills on any of her medications that she takes on a regular basis. We will continue to follow-up with any specialist that are following her on a regular basis including cardiology and urology. We will arrange for patient to see the gastroenterologist because of increasing problems with swallowing and choking She will come by the office for lab work and get a B12 injection and hopefully be able to teach 1 of her relatives to give her B12 injections at home.     The above assessment and management plan was discussed with the patient. The patient verbalized understanding of and has agreed to the management plan. Patient is aware to call the clinic if symptoms persist or worsen. Patient is aware when to return to the clinic for a follow-up visit. Patient educated on when it is appropriate to go to the emergency department.    Chipper Herb, MD [address], [place], [place] [address] Ph [phone]   Arrie Senate MD

## 2018-12-20 NOTE — Addendum Note (Signed)
Addended by: Zannie Cove on: 12/20/2018 11:48 AM   Modules accepted: Orders

## 2018-12-20 NOTE — Patient Instructions (Addendum)
The patient will continue to take her current medicines She will come by the office at a convenient time to get her blood work and will call before she comes to make sure that it is an appropriate time and a safe time for her to come. She will continue to drink plenty of water and stay well-hydrated She will use her walker and wheelchair at home and make every effort to not put herself at risk for falling She will call the office if she needs refills on any of her medications that she takes on a regular basis. We will continue to follow-up with any specialist that are following her on a regular basis including cardiology and urology. We will arrange for patient to see the gastroenterologist because of increasing problems with swallowing and choking She will come by the office for lab work and get a B12 injection and hopefully be able to teach 1 of her relatives to give her B12 injections at home.

## 2018-12-23 ENCOUNTER — Ambulatory Visit (INDEPENDENT_AMBULATORY_CARE_PROVIDER_SITE_OTHER): Payer: Medicare Other | Admitting: Internal Medicine

## 2018-12-23 ENCOUNTER — Other Ambulatory Visit: Payer: Self-pay | Admitting: Family Medicine

## 2018-12-23 ENCOUNTER — Encounter (INDEPENDENT_AMBULATORY_CARE_PROVIDER_SITE_OTHER): Payer: Self-pay | Admitting: Internal Medicine

## 2018-12-23 ENCOUNTER — Other Ambulatory Visit: Payer: Self-pay

## 2018-12-23 VITALS — BP 122/77 | HR 84 | Temp 98.0°F | Ht 65.0 in | Wt 137.0 lb

## 2018-12-23 DIAGNOSIS — R1319 Other dysphagia: Secondary | ICD-10-CM

## 2018-12-23 DIAGNOSIS — R1310 Dysphagia, unspecified: Secondary | ICD-10-CM

## 2018-12-23 NOTE — Telephone Encounter (Signed)
Spoke with pt and she states she had been given Tramadol 50mg  1 time before. Advised pt it isn't on her med list and DWM isn't in office till 12/29/18 but I would send it the message to him as he would need to be the one to refill it since it is controlled substance. Pt voiced understanding. Rx is pending, if ok for her to have please sign and I will let her know.

## 2018-12-23 NOTE — Patient Instructions (Signed)
DG esophagram.   

## 2018-12-23 NOTE — Progress Notes (Addendum)
Subjective:    Patient ID: Cindy Duran, female    DOB: 1936/07/20, 83 y.o.   MRN: [id number] Patient is allergic to Demerol and Morphone Patient's blood pressure will drop when she stands.  HPI Referred by Dr. Laurance Flatten for dysphagia. She says she chokes on food. Sometimes she has to vomit the bolus up. Symptoms for at least 3 years. Symptoms progressively worse. No certain foods bother. She has lost about 10 pounds. Daughter states mother grazes when she eats.  Her appetite is okay. She eats what she feels like she wants. She has a BM every few days. Tends to take laxatives for her constipation.    Review of Systems Past Medical History:  Diagnosis Date  . Classical migraine with intractable migraine 12/03/2015  . Essential hypertension   . Hyperlipidemia   . Hypothyroidism   . Migraine   . TIA (transient ischemic attack)     Past Surgical History:  Procedure Laterality Date  . ABDOMINAL HYSTERECTOMY    . APPENDECTOMY    . ROTATOR CUFF REPAIR    . SHOULDER SURGERY      Allergies  Allergen Reactions  . Fioricet [Butalbital-Apap-Caffeine] Other (See Comments)    hallucinations  . Iodine Other (See Comments)    "skin comes off"  . Latex Other (See Comments)    Skin peeling off  . Meloxicam Other (See Comments)    Pt can't remember rxn  . Meperidine Hcl Nausea And Vomiting  . Morphine Nausea And Vomiting  . Other Other (See Comments)    Clorox Bleach  . Penicillins Hives    Did it involve swelling of the face/tongue/throat, SOB, or low BP? Unknown Did it involve sudden or severe rash/hives, skin peeling, or any reaction on the inside of your mouth or nose? Unknown Did you need to seek medical attention at a hospital or doctor's office? Unknown When did it last happen?childhood If all above answers are "NO", may proceed with cephalosporin use.   . Rosuvastatin Other (See Comments)    Pt can't remember rxn  . Simvastatin Other (See Comments)    Pt can't remember  rxn  . Sulfonamide Derivatives Swelling  . Tamiflu [Oseltamivir] Diarrhea    Severe, requiring hospitalization.    Current Outpatient Medications on File Prior to Visit  Medication Sig Dispense Refill  . aspirin-acetaminophen-caffeine (EXCEDRIN MIGRAINE) 250-250-65 MG tablet Take 2 tablets by mouth every 6 (six) hours as needed for headache or migraine.     Marland Kitchen atorvastatin (LIPITOR) 20 MG tablet Take 1 tablet by mouth once daily 90 tablet 0  . Cholecalciferol (VITAMIN D-3) 5000 UNITS TABS Take 1 capsule by mouth See admin instructions. Take 1 capsule once daily by mouth Monday thru Friday    . clopidogrel (PLAVIX) 75 MG tablet Take 1 tablet by mouth once daily 90 tablet 0  . SYNTHROID 125 MCG tablet Take 1 tablet (125 mcg total) by mouth daily. (need repeat labwork) 90 tablet 0   Current Facility-Administered Medications on File Prior to Visit  Medication Dose Route Frequency Provider Last Rate Last Dose  . cyanocobalamin ((VITAMIN B-12)) injection 1,000 mcg  1,000 mcg Intramuscular Q30 days Chipper Herb, MD   1,000 mcg at 07/02/18 1433        Objective:   Physical Exam Blood pressure 122/77, pulse 84, temperature 98 F (36.7 C), height 5\' 5"  (1.651 m), weight 137 lb (62.1 kg).  Alert and oriented. Skin warm and dry. Oral mucosa is moist.   .  Sclera anicteric, conjunctivae is pink. Thyroid not enlarged. No cervical lymphadenopathy. Lungs clear. Heart regular rate and rhythm.  Abdomen is soft. Bowel sounds are positive. No hepatomegaly. No abdominal masses felt. No tenderness.  No edema to lower extremities.         Assessment & Plan:  Dysphagia. Will get an esophagram first. She is on a blood thinner.

## 2018-12-24 MED ORDER — TRAMADOL HCL 50 MG PO TABS: 50.0000 mg | ORAL_TABLET | Freq: Three times a day (TID) | ORAL | 0 refills | Status: AC | PRN

## 2018-12-24 NOTE — Telephone Encounter (Signed)
Left message advising requested rx sent into pharmacy and to call back with any further questions or concerns. °

## 2018-12-27 ENCOUNTER — Ambulatory Visit (HOSPITAL_COMMUNITY)
Admission: RE | Admit: 2018-12-27 | Discharge: 2018-12-27 | Disposition: A | Discharge status: Home / Self Care | Payer: Medicare Other | Source: Ambulatory Visit | Attending: Internal Medicine | Admitting: Internal Medicine

## 2018-12-27 ENCOUNTER — Other Ambulatory Visit: Payer: Self-pay

## 2018-12-27 ENCOUNTER — Telehealth (INDEPENDENT_AMBULATORY_CARE_PROVIDER_SITE_OTHER): Payer: Self-pay | Admitting: Internal Medicine

## 2018-12-27 ENCOUNTER — Other Ambulatory Visit (INDEPENDENT_AMBULATORY_CARE_PROVIDER_SITE_OTHER): Payer: Self-pay | Admitting: Internal Medicine

## 2018-12-27 DIAGNOSIS — R1319 Other dysphagia: Secondary | ICD-10-CM

## 2018-12-27 DIAGNOSIS — R1310 Dysphagia, unspecified: Secondary | ICD-10-CM | POA: Diagnosis not present

## 2018-12-27 NOTE — Telephone Encounter (Signed)
err

## 2018-12-27 NOTE — Telephone Encounter (Signed)
Cindy Duran, EGD/ED. She is on Plavix. She is allergic to Morphine and Demerol. She has orthostatic hypotension

## 2018-12-27 NOTE — Telephone Encounter (Signed)
Cindy Duran, EGD/ED. She is on Plavix Allergic to morphine  and Demeral.

## 2018-12-28 ENCOUNTER — Telehealth: Payer: Self-pay | Admitting: Family Medicine

## 2018-12-28 DIAGNOSIS — R1310 Dysphagia, unspecified: Secondary | ICD-10-CM | POA: Insufficient documentation

## 2018-12-28 DIAGNOSIS — R1319 Other dysphagia: Secondary | ICD-10-CM | POA: Insufficient documentation

## 2018-12-28 NOTE — Telephone Encounter (Signed)
EGD/ED sch'd 01/05/19, left detailed message for patient

## 2018-12-28 NOTE — Telephone Encounter (Signed)
Yes okay to hold plavix fro 5 days prior and start back after procedure

## 2018-12-28 NOTE — Telephone Encounter (Signed)
Covering PCP

## 2018-12-28 NOTE — Telephone Encounter (Signed)
Ann aware

## 2018-12-29 ENCOUNTER — Telehealth (INDEPENDENT_AMBULATORY_CARE_PROVIDER_SITE_OTHER): Payer: Self-pay | Admitting: *Deleted

## 2018-12-29 NOTE — Telephone Encounter (Signed)
Per Dr Tawanna Sat office it is ok for patient to hold Plavix 5 days prior to EGD, patient aware

## 2018-12-31 ENCOUNTER — Other Ambulatory Visit: Payer: Self-pay

## 2018-12-31 ENCOUNTER — Other Ambulatory Visit (HOSPITAL_COMMUNITY)
Admission: RE | Admit: 2018-12-31 | Discharge: 2018-12-31 | Disposition: A | Discharge status: Home / Self Care | Payer: Medicare Other | Source: Ambulatory Visit | Attending: Internal Medicine | Admitting: Internal Medicine

## 2018-12-31 DIAGNOSIS — Z1159 Encounter for screening for other viral diseases: Secondary | ICD-10-CM | POA: Insufficient documentation

## 2019-01-01 LAB — NOVEL CORONAVIRUS, NAA (HOSP ORDER, SEND-OUT TO REF LAB; TAT 18-24 HRS): SARS-CoV-2, NAA: NOT DETECTED

## 2019-01-05 ENCOUNTER — Ambulatory Visit (HOSPITAL_COMMUNITY)
Admission: RE | Admit: 2019-01-05 | Discharge: 2019-01-05 | Disposition: A | Discharge status: Home / Self Care | Payer: Medicare Other | Attending: Internal Medicine | Admitting: Internal Medicine

## 2019-01-05 ENCOUNTER — Encounter (HOSPITAL_COMMUNITY)
Admission: RE | Disposition: A | Discharge status: Home / Self Care | Payer: Self-pay | Source: Home / Self Care | Attending: Internal Medicine

## 2019-01-05 ENCOUNTER — Encounter (HOSPITAL_COMMUNITY): Payer: Self-pay | Admitting: *Deleted

## 2019-01-05 ENCOUNTER — Other Ambulatory Visit: Payer: Self-pay

## 2019-01-05 DIAGNOSIS — R1310 Dysphagia, unspecified: Secondary | ICD-10-CM

## 2019-01-05 DIAGNOSIS — R1319 Other dysphagia: Secondary | ICD-10-CM | POA: Insufficient documentation

## 2019-01-05 SURGERY — EGD (ESOPHAGOGASTRODUODENOSCOPY)
Anesthesia: Moderate Sedation

## 2019-01-05 NOTE — OR Nursing (Signed)
Cindy Duran arrived for EGD/ED. Stated she took her Plavix yesterday. Dr. Laural Golden notified and said Cindy Duran's procedure would need to be cancelled. Lacretia Nicks at Dr. Olevia Perches office notified and stated she would call Cindy Duran next week and get her procedure rescheduled. Cindy Duran informed of the above and verbalized understanding.

## 2019-01-05 NOTE — H&P (Signed)
Please note procedure canceled and rescheduled for another day

## 2019-01-11 ENCOUNTER — Other Ambulatory Visit (INDEPENDENT_AMBULATORY_CARE_PROVIDER_SITE_OTHER): Payer: Self-pay | Admitting: Internal Medicine

## 2019-01-11 DIAGNOSIS — R1310 Dysphagia, unspecified: Secondary | ICD-10-CM | POA: Insufficient documentation

## 2019-01-16 DIAGNOSIS — R410 Disorientation, unspecified: Secondary | ICD-10-CM | POA: Diagnosis not present

## 2019-01-16 DIAGNOSIS — R55 Syncope and collapse: Secondary | ICD-10-CM | POA: Diagnosis not present

## 2019-01-16 DIAGNOSIS — R4020 Unspecified coma: Secondary | ICD-10-CM | POA: Diagnosis not present

## 2019-01-16 DIAGNOSIS — I10 Essential (primary) hypertension: Secondary | ICD-10-CM | POA: Diagnosis not present

## 2019-01-17 ENCOUNTER — Telehealth: Payer: Self-pay | Admitting: Family Medicine

## 2019-01-17 NOTE — Telephone Encounter (Signed)
Daughter Barnett Applebaum called and wanted to give DWM an update of what happened to patient yesterday.  Daughter states that around 10:30am patient passed out.  States that EMS was called and they took her bp.  Daughter got patient off the floor before EMS came and laied her on the couch.   BP laying- 198/110 BP sitting- 168/90 BP standing- 110/80  Daughter states that patient is supposed to have her a procedure Wednesday with Dr. Francia Greaves to have her esophagus stretched. Since then patient has not been eating and drinking like she should since she chocks when she eats.  Daughter states that EMS said she was dehydrated and she has been forcing fluid since yesterday.  Patient seems to be doing better today.  Wanting to know if patient should still have her procedure done Wednesday and also should she come in to be seen for follow up or can she do a phone visit with DWM?   Please advise and send back to the pools

## 2019-01-17 NOTE — Telephone Encounter (Signed)
Daughter aware and verbalizes understanding. 

## 2019-01-17 NOTE — Telephone Encounter (Signed)
Please check more blood pressure readings They will not do procedure if blood pressure is extremely elevated It sounds like if she is choking, she needs to have the procedure done and have a dilatation so that she can eat and drink appropriately Make sure she is taking her medicines regularly Take blood pressure readings with her to the visit to see Dr. Laural Golden Call additional readings into [place] in the morning. Continue to watch sodium intake.

## 2019-01-18 ENCOUNTER — Telehealth (INDEPENDENT_AMBULATORY_CARE_PROVIDER_SITE_OTHER): Payer: Self-pay | Admitting: *Deleted

## 2019-01-18 ENCOUNTER — Other Ambulatory Visit: Payer: Self-pay | Admitting: Physician Assistant

## 2019-01-18 NOTE — Telephone Encounter (Signed)
Pressure should be under better control if it is still running that high. These schedule visit with provider in the office to get more blood pressure readings and adjust medication before any procedure is done by Dr. Laural Golden Resume previous blood thinning agents as doing before the Plavix was stopped until blood pressure is better control and then problems with swallowing can be reevaluated by Dr. Laural Golden at that time

## 2019-01-18 NOTE — Telephone Encounter (Signed)
[name] office called by Cindy Duran daughter -  He  prefers that [name] make the decision to have procedure or not ?   She is still on ASA as of yesterday (rehmans office was unaware she was on this asa)   Plavix was stopped at ordered .

## 2019-01-18 NOTE — Telephone Encounter (Signed)
Patient's daughter Cindy Duran called in -- pt had a syncope episode Sunday, having issues with BP going high and bottoming out, also dehydrated -- Sunday BP: 198/110, 168/95 & orthostatic 110/80. Monday BP readings: 179/105 around 7-7:30 & 165/97 around 11 pm -- Cindy Duran spoke to pts PCP and they asked her to call and let [name] know -- per [name] patients needs to be evaluated by her PCP and he needs to give the clearance for [place] to proceed with procedure -- Cindy Duran is aware and will contact pts PCP and let me know -- per [name] procedure needs to be canceled -- Cindy Duran is aware and will contact me when her mom is able to reschedule

## 2019-01-18 NOTE — Telephone Encounter (Signed)
Daughter aware and appt made for in-office appt

## 2019-01-18 NOTE — Telephone Encounter (Signed)
Will plan EGD when acute issues sorted out

## 2019-01-18 NOTE — Telephone Encounter (Signed)
More readings include:  179/105 165/97 Last night   171/98 This am  Advised  - to call Dr Otelia Limes nurse.

## 2019-01-19 ENCOUNTER — Ambulatory Visit (HOSPITAL_COMMUNITY): Admission: RE | Admit: 2019-01-19 | Payer: Medicare Other | Source: Home / Self Care | Admitting: Internal Medicine

## 2019-01-19 ENCOUNTER — Encounter (HOSPITAL_COMMUNITY): Admission: RE | Payer: Self-pay | Source: Home / Self Care

## 2019-01-19 SURGERY — EGD (ESOPHAGOGASTRODUODENOSCOPY)
Anesthesia: Moderate Sedation

## 2019-01-21 ENCOUNTER — Ambulatory Visit (INDEPENDENT_AMBULATORY_CARE_PROVIDER_SITE_OTHER): Payer: Medicare Other | Admitting: Family Medicine

## 2019-01-21 ENCOUNTER — Encounter: Payer: Self-pay | Admitting: Family Medicine

## 2019-01-21 ENCOUNTER — Other Ambulatory Visit: Payer: Self-pay

## 2019-01-21 VITALS — BP 97/61 | HR 93 | Temp 97.3°F | Wt 132.2 lb

## 2019-01-21 VITALS — BP 116/76

## 2019-01-21 DIAGNOSIS — N3000 Acute cystitis without hematuria: Secondary | ICD-10-CM

## 2019-01-21 DIAGNOSIS — R2990 Unspecified symptoms and signs involving the nervous system: Secondary | ICD-10-CM

## 2019-01-21 DIAGNOSIS — R531 Weakness: Secondary | ICD-10-CM | POA: Diagnosis not present

## 2019-01-21 DIAGNOSIS — R55 Syncope and collapse: Secondary | ICD-10-CM

## 2019-01-21 LAB — URINALYSIS, COMPLETE
Bilirubin, UA: NEGATIVE
Glucose, UA: NEGATIVE
Nitrite, UA: POSITIVE — AB
RBC, UA: NEGATIVE
Specific Gravity, UA: 1.025 (ref 1.005–1.030)
Urobilinogen, Ur: 1 mg/dL (ref 0.2–1.0)
pH, UA: 5.5 (ref 5.0–7.5)

## 2019-01-21 LAB — MICROSCOPIC EXAMINATION
Epithelial Cells (non renal): >10 /hpf — AB (ref 0–10)
Renal Epithel, UA: NONE SEEN /hpf

## 2019-01-21 MED ORDER — CEPHALEXIN 500 MG PO CAPS: 500.0000 mg | ORAL_CAPSULE | Freq: Four times a day (QID) | ORAL | 0 refills | Status: AC

## 2019-01-21 NOTE — Progress Notes (Signed)
Telephone visit  Subjective: CC: elevated BP PCP: Chipper Herb, MD BTD:[id number] NIAMBI SMOAK is a 83 y.o. female calls for telephone consult today. Patient provides verbal consent for consult held via phone.  Location of patient: home Location of provider: Working remotely from home Others present for call: Gina  1. Syncope Patient had a syncopal episode Sunday which was witnessed by her son-in-law.  She was incoherent for a few minutes after the episode. Episode was thought to be secondary to orthostasis and dehydration.   They have been checking her blood pressures at home and today note that she had a sitting blood pressure of 116/76 and a lying blood pressure of 156/88.  Her standing blood pressure was lower than her sitting position but they do not recall what the number was.  However, she has since has lost a significant amount of hearing.  She reports that she cannot hear out of the right side.  She reports a slight delay in word understanding, calling it "jibberish".   Blood pressure 116/76.  Based on patient's reports, I am concerned that she had possible stroke.  At minimum I think that we should have her evaluated in the office so that we can perform a full neurologic exam.  She has history of TIA in the past.  Given ongoing symptoms she will likely need MRI of the brain.  I have reached out to the on-call provider today to inform him of my concerns.  Patient's daughter is aware of time of the appointment and have encouraged her to arrive a couple minutes early if possible.  Start time: 8:35am End time: 8:47am  Total time spent on patient care (including telephone call/ virtual visit): 18 minutes  [place], [place] [phone]

## 2019-01-21 NOTE — Progress Notes (Signed)
BP 97/61   Pulse 93   Temp (!) 97.3 F (36.3 C) (Oral)    Subjective:   Patient ID: Cindy Duran, female    DOB: 1935-10-03, 83 y.o.   MRN: [id number]  HPI: Cindy Duran is a 83 y.o. female presenting on 01/21/2019 for Loss of Consciousness (Sunday - spoke with Dr, Darnell Level today)   HPI Patient comes in with complaints of a syncopal episode loss of consciousness.  She says the syncopal episode and loss of consciousness occurred 5 days ago.  She was walking into the kitchen to help her granddaughter with some gravy and all of a sudden blacked out.  Her son caught her so she did not hit the floor and they says she was very briefly out of consciousness and then came right back.  She then was able to on her own crawl over to a couch and climb up to the couch and was completely back to the way she was at that point.  She does admit that she feels generally weak since then and she has not been eating well because she needs an esophageal dilatation and is due for that later next week.  She denies any urinary problems or bowel problems or abdominal pain.  She just feels weak and not like herself and then just had this 1 episode of loss of consciousness.  Her blood pressure is down today and she says it has happened before that she has had loss of consciousness when she stands up too quickly but does not think that was the case this time.  She did not have any premonition that this was can happen or any feelings of lightheadedness or dizziness but that it just happened all of a sudden.  Relevant past medical, surgical, family and social history reviewed and updated as indicated. Interim medical history since our last visit reviewed. Allergies and medications reviewed and updated.  Review of Systems  Constitutional: Negative for chills and fever.  Eyes: Negative for visual disturbance.  Respiratory: Negative for chest tightness and shortness of breath.   Cardiovascular: Negative for chest pain and leg  swelling.  Genitourinary: Negative for difficulty urinating, dysuria, frequency, hematuria and urgency.  Musculoskeletal: Negative for arthralgias, back pain and gait problem.  Skin: Negative for rash.  Neurological: Positive for dizziness, weakness and light-headedness. Negative for numbness and headaches.  Psychiatric/Behavioral: Negative for agitation and behavioral problems.  All other systems reviewed and are negative.   Per HPI unless specifically indicated above   Allergies as of 01/21/2019      Reactions   Fioricet [butalbital-apap-caffeine] Other (See Comments)   hallucinations   Iodine Other (See Comments)   "skin comes off"   Latex Other (See Comments)   Skin peeling off   Meloxicam Other (See Comments)   Pt can't remember rxn   Meperidine Hcl Nausea And Vomiting   Morphine Nausea And Vomiting   Other Hives   Clorox Bleach   Penicillins Hives   Did it involve swelling of the face/tongue/throat, SOB, or low BP? Unknown Did it involve sudden or severe rash/hives, skin peeling, or any reaction on the inside of your mouth or nose? Unknown Did you need to seek medical attention at a hospital or doctor's office? Unknown When did it last happen?childhood If all above answers are "NO", may proceed with cephalosporin use.   Rosuvastatin Other (See Comments)   Pt can't remember rxn   Simvastatin Other (See Comments)   Pt can't remember rxn  Sulfonamide Derivatives Swelling   Tamiflu [oseltamivir] Diarrhea   Severe, requiring hospitalization.      Medication List       Accurate as of January 21, 2019  2:30 PM. If you have any questions, ask your nurse or doctor.        aspirin-acetaminophen-caffeine 250-250-65 MG tablet Commonly known as:  EXCEDRIN MIGRAINE Take 2 tablets by mouth every 6 (six) hours as needed for headache or migraine.   atorvastatin 20 MG tablet Commonly known as:  LIPITOR Take 1 tablet by mouth once daily   B-12 1000 MCG/ML Kit Inject 1,000  mcg as directed every 30 (thirty) days.   clopidogrel 75 MG tablet Commonly known as:  PLAVIX Take 1 tablet by mouth once daily   Synthroid 125 MCG tablet Generic drug:  levothyroxine TAKE 1 TABLET BY MOUTH ONCE DAILY (NEED TO REPEAT LABWORK)   traMADol 50 MG tablet Commonly known as:  ULTRAM Take 50 mg by mouth daily as needed (pain).   Vitamin D-3 125 MCG (5000 UT) Tabs Take 5,000 Units by mouth daily.        Objective:   BP 97/61   Pulse 93   Temp (!) 97.3 F (36.3 C) (Oral)   Wt Readings from Last 3 Encounters:  12/23/18 137 lb (62.1 kg)  09/10/18 149 lb (67.6 kg)  07/02/18 159 lb (72.1 kg)    Physical Exam Vitals signs and nursing note reviewed.  Constitutional:      General: She is not in acute distress.    Appearance: She is well-developed. She is not diaphoretic.  Eyes:     Conjunctiva/sclera: Conjunctivae normal.  Cardiovascular:     Rate and Rhythm: Normal rate and regular rhythm.     Heart sounds: Normal heart sounds. No murmur.  Pulmonary:     Effort: Pulmonary effort is normal. No respiratory distress.     Breath sounds: Normal breath sounds. No wheezing.  Abdominal:     General: Abdomen is flat. Bowel sounds are normal. There is no distension.     Tenderness: There is no abdominal tenderness. There is no right CVA tenderness, left CVA tenderness, guarding or rebound.  Musculoskeletal: Normal range of motion.        General: No tenderness.  Skin:    General: Skin is warm and dry.     Findings: No rash.  Neurological:     Mental Status: She is alert and oriented to person, place, and time.     Cranial Nerves: No cranial nerve deficit.     Sensory: No sensory deficit.     Motor: Weakness (Generalized weakness 4-5 in all 4 extremities) present.     Coordination: Coordination normal.     Gait: Gait abnormal (Wheelchair today because of instability of gait).  Psychiatric:        Behavior: Behavior normal.    Urinalysis: 11-30 WBCs, 0-2 RBCs,  greater than 10 epithelial cells, crystals present, calcium oxalate, many bacteria, positive nitrite   Assessment & Plan:   Problem List Items Addressed This Visit      Genitourinary   UTI (urinary tract infection)   Relevant Medications   cephALEXin (KEFLEX) 500 MG capsule    Other Visit Diagnoses    Syncope, unspecified syncope type    -  Primary   Relevant Medications   cephALEXin (KEFLEX) 500 MG capsule   Other Relevant Orders   Urinalysis, Complete   Urine Culture   CBC with Differential/Platelet   CMP14+EGFR   TSH  Weakness       Relevant Medications   cephALEXin (KEFLEX) 500 MG capsule   Other Relevant Orders   Urinalysis, Complete   Urine Culture   CBC with Differential/Platelet   CMP14+EGFR   TSH       Follow up plan: Return if symptoms worsen or fail to improve.  Counseling provided for all of the vaccine components Orders Placed This Encounter  Procedures  . Urine Culture  . Urinalysis, Complete  . CBC with Differential/Platelet  . CMP14+EGFR  . TSH    Caryl Pina, MD [facility] 01/21/2019, 2:30 PM

## 2019-01-24 LAB — URINE CULTURE

## 2019-02-04 ENCOUNTER — Telehealth: Payer: Self-pay | Admitting: Family Medicine

## 2019-02-17 ENCOUNTER — Other Ambulatory Visit: Payer: Self-pay

## 2019-02-17 ENCOUNTER — Ambulatory Visit (INDEPENDENT_AMBULATORY_CARE_PROVIDER_SITE_OTHER): Payer: Medicare Other | Admitting: Family

## 2019-02-17 ENCOUNTER — Encounter: Payer: Self-pay | Admitting: Family

## 2019-02-17 DIAGNOSIS — R399 Unspecified symptoms and signs involving the genitourinary system: Secondary | ICD-10-CM

## 2019-02-17 MED ORDER — CIPROFLOXACIN HCL 500 MG PO TABS: 500.0000 mg | ORAL_TABLET | Freq: Two times a day (BID) | ORAL | 0 refills | Status: AC

## 2019-02-17 NOTE — Progress Notes (Signed)
   Virtual Visit via telephone Note  I connected with Cindy Duran on 02/17/19 at 12:42 pm by telephone and verified that I am speaking with the correct person using two identifiers. Cindy Duran is currently located at home and daughter & daughter in-law is currently with her during visit. The provider, Evelina Dun, FNP is located in their office at time of visit.  I discussed the limitations, risks, security and privacy concerns of performing an evaluation and management service by telephone and the availability of in person appointments. I also discussed with the patient that there may be a patient responsible charge related to this service. The patient expressed understanding and agreed to proceed.   History and Present Illness:  Urinary Frequency  This is a recurrent problem. The current episode started more than 1 month ago. The problem occurs intermittently. The problem has been waxing and waning. The patient is experiencing no pain. Associated symptoms include frequency, hesitancy, nausea and urgency. Pertinent negatives include no hematuria. She has tried increased fluids and antibiotics for the symptoms. The treatment provided mild relief.      Review of Systems  Gastrointestinal: Positive for nausea.  Genitourinary: Positive for frequency, hesitancy and urgency. Negative for hematuria.     Observations/Objective: No SOB or distress   Assessment and Plan: 1. UTI symptoms Daughter will bring sample to office so we can send off for urine culture Force fluids RTO if symptoms worsen or do not improve  Culture pending - Urine Culture; Future - ciprofloxacin (CIPRO) 500 MG tablet; Take 1 tablet (500 mg total) by mouth 2 (two) times daily.  Dispense: 10 tablet; Refill: 0     I discussed the assessment and treatment plan with the patient. The patient was provided an opportunity to ask questions and all were answered. The patient agreed with the plan and demonstrated an  understanding of the instructions.   The patient was advised to call back or seek an in-person evaluation if the symptoms worsen or if the condition fails to improve as anticipated.  The above assessment and management plan was discussed with the patient. The patient verbalized understanding of and has agreed to the management plan. Patient is aware to call the clinic if symptoms persist or worsen. Patient is aware when to return to the clinic for a follow-up visit. Patient educated on when it is appropriate to go to the emergency department.   Time call ended:  12:55 pm  I provided 13 minutes of non-face-to-face time during this encounter.    Evelina Dun, FNP

## 2019-02-17 NOTE — Addendum Note (Signed)
Addended by: Liliane Bade on: 02/17/2019 02:48 PM   Modules accepted: Orders

## 2019-02-18 ENCOUNTER — Other Ambulatory Visit: Payer: Self-pay | Admitting: Family Medicine

## 2019-02-18 NOTE — Telephone Encounter (Signed)
Patient seen since call.  This encounter will now be closed  

## 2019-02-20 LAB — URINE CULTURE

## 2019-03-02 ENCOUNTER — Telehealth: Payer: Self-pay | Admitting: Family Medicine

## 2019-03-03 ENCOUNTER — Other Ambulatory Visit: Payer: Self-pay | Admitting: Family

## 2019-03-19 DEATH — deceased

## 2023-01-05 NOTE — Telephone Encounter (Signed)
Erroneous encounter will close.
